# Patient Record
Sex: Male | Born: 1945 | Race: White | Hispanic: No | Smoking: Former smoker
Health system: Southern US, Community
[De-identification: ages and names within clinical notes are randomized; demographics above are authoritative.]

## PROBLEM LIST (undated history)

## (undated) DIAGNOSIS — R0789 Other chest pain: Secondary | ICD-10-CM

## (undated) DIAGNOSIS — R0602 Shortness of breath: Secondary | ICD-10-CM

## (undated) DIAGNOSIS — R972 Elevated prostate specific antigen [PSA]: Secondary | ICD-10-CM

## (undated) DIAGNOSIS — I10 Essential (primary) hypertension: Secondary | ICD-10-CM

## (undated) DIAGNOSIS — I214 Non-ST elevation (NSTEMI) myocardial infarction: Principal | ICD-10-CM

## (undated) DIAGNOSIS — M1711 Unilateral primary osteoarthritis, right knee: Secondary | ICD-10-CM

## (undated) DIAGNOSIS — D120 Benign neoplasm of cecum: Secondary | ICD-10-CM

## (undated) DIAGNOSIS — I2510 Atherosclerotic heart disease of native coronary artery without angina pectoris: Secondary | ICD-10-CM

## (undated) DIAGNOSIS — E119 Type 2 diabetes mellitus without complications: Secondary | ICD-10-CM

## (undated) HISTORY — PX: COLON SURGERY: SHX602

## (undated) HISTORY — PX: APPENDECTOMY: SHX54

## (undated) HISTORY — PX: CORONARY ARTERY BYPASS GRAFT: SHX141

## (undated) HISTORY — PX: NECK SURGERY: SHX720

## (undated) MED ORDER — DOXYCYCLINE HYCLATE 100 MG CAP
100 mg | ORAL_CAPSULE | Freq: Two times a day (BID) | ORAL | Status: AC
Start: ? — End: 2012-01-27

## (undated) MED ORDER — CARTIA XT 300 MG CAPSULE,EXTENDED RELEASE
300 mg | ORAL_CAPSULE | ORAL | Status: DC
Start: ? — End: 2013-06-28

## (undated) MED ORDER — ATORVASTATIN 10 MG TAB
10 mg | ORAL_TABLET | Freq: Every evening | ORAL | Status: DC
Start: ? — End: 2013-07-10

## (undated) MED ORDER — LISINOPRIL 20 MG TAB
20 mg | ORAL_TABLET | ORAL | Status: DC
Start: ? — End: 2013-06-28

---

## 2009-12-16 NOTE — Patient Instructions (Addendum)
Dear Jeffrey Soto,    I have reviewed your lab results.  EKG  Looks fine.   Mucinex OTC  1 pill twice daily  As needed  For cough. Wait till over this cold before you get your flu shot.   Based on this, I would like for you to schedule an appointment with me for blood pressure and cholesterol appointment.  Stop smoking pipe  Feel free to schedule an appointment by clicking on the "Request an appointment" link, or by calling my office at the number below.    Sincerely,    Calliope Delangel Oren Section, MD   Loletta Specter, [place], [facility]  [phone]

## 2009-12-17 LAB — CBC WITH AUTOMATED DIFF
ABS. BASOPHILS: 0.1 10*3/uL (ref 0.0–0.2)
ABS. EOSINOPHILS: 0.2 10*3/uL (ref 0.0–0.4)
ABS. IMM. GRANS.: 0 10*3/uL (ref 0.0–0.1)
ABS. LYMPHOCYTES: 1.6 10*3/uL (ref 0.7–4.5)
ABS. MONOCYTES: 0.6 10*3/uL (ref 0.1–1.0)
ABS. NEUTROPHILS: 3.4 10*3/uL (ref 1.8–7.8)
BASOPHILS: 1 % (ref 0–3)
EOSINOPHILS: 3 % (ref 0–7)
HCT: 42 % (ref 36.0–50.0)
HGB: 14.3 g/dL (ref 12.5–17.0)
IMMATURE GRANULOCYTES: 0 % (ref 0–2)
LYMPHOCYTES: 28 % (ref 14–46)
MCH: 28.9 pg (ref 27.0–34.0)
MCHC: 34 g/dL (ref 32.0–36.0)
MCV: 85 fL (ref 80–98)
MONOCYTES: 10 % (ref 4–13)
NEUTROPHILS: 58 % (ref 40–74)
PLATELET: 208 10*3/uL (ref 140–415)
RBC: 4.94 x10E6/uL (ref 4.10–5.60)
RDW: 14.2 % (ref 11.7–15.0)
WBC: 5.8 10*3/uL (ref 4.0–10.5)

## 2009-12-17 LAB — URINALYSIS W/ RFLX MICROSCOPIC
Bilirubin: NEGATIVE
Blood: NEGATIVE
Glucose: NEGATIVE
Ketone: NEGATIVE
Leukocyte Esterase: NEGATIVE
Nitrites: NEGATIVE
Protein: NEGATIVE
Specific Gravity: 1.015 (ref 1.005–1.030)
Urobilinogen: 0.2 mg/dL (ref 0.0–1.9)
pH (UA): 7 (ref 5.0–7.5)

## 2009-12-17 LAB — METABOLIC PANEL, COMPREHENSIVE
A-G Ratio: 1.8 (ref 1.1–2.5)
ALT (SGPT): 38 IU/L (ref 0–55)
AST (SGOT): 30 IU/L (ref 0–40)
Albumin: 4.6 g/dL (ref 3.6–4.8)
Alk. phosphatase: 59 IU/L (ref 25–160)
BUN/Creatinine ratio: 16 (ref 10–22)
BUN: 12 mg/dL (ref 8–27)
Bilirubin, total: 0.3 mg/dL (ref 0.0–1.2)
CO2: 23 mmol/L (ref 20–32)
Calcium: 8.9 mg/dL (ref 8.6–10.2)
Chloride: 100 mmol/L (ref 97–108)
Creatinine: 0.76 mg/dL (ref 0.76–1.27)
GFR est AA: 112 mL/min/{1.73_m2} (ref >59)
GFR est non-AA: 97 mL/min/{1.73_m2} (ref >59)
GLOBULIN, TOTAL: 2.6 g/dL (ref 1.5–4.5)
Glucose: 105 mg/dL — ABNORMAL HIGH (ref 65–99)
Potassium: 4.1 mmol/L (ref 3.5–5.2)
Protein, total: 7.2 g/dL (ref 6.0–8.5)
Sodium: 137 mmol/L (ref 135–145)

## 2009-12-17 LAB — LIPID PANEL
Cholesterol, total: 178 mg/dL (ref 100–199)
HDL Cholesterol: 36 mg/dL — ABNORMAL LOW (ref >39)
LDL, calculated: 108 mg/dL — ABNORMAL HIGH (ref 0–99)
Triglyceride: 170 mg/dL — ABNORMAL HIGH (ref 0–149)
VLDL, calculated: 34 mg/dL (ref 5–40)

## 2009-12-17 LAB — PSA, DIAGNOSTIC (PROSTATE SPECIFIC AG): Prostate Specific Ag: 1.2 ng/mL (ref 0.0–4.0)

## 2009-12-19 NOTE — Progress Notes (Signed)
Jeffrey Soto is a 64 y.o. male    HPI  Recovering from recent URI, feeling better today. Transient anal pain with coughing assoc'd . Pain responded to Preparation H  Past Medical History   Diagnosis Date   ??? HTN (hypertension) 12/16/2009   ??? Elevated lipids 12/16/2009   ??? HX OTHER MEDICAL      cubital tunnel,left elbow   ??? Depression        Past Surgical History   Procedure Date   ??? Hx urological      right orchiectomy for benign growth   ??? Endoscopy, colon, diagnostic    ??? Hx gi      right inguinal hernia repair x3   ??? Hx other surgical      removal pilonidal cyst       Current outpatient prescriptions   Medication Sig Dispense Refill   ??? lansoprazole (PREVACID) 15 mg capsule Take  by mouth Daily (before breakfast).       ??? diltiazem CD (CARDIZEM CD) 300 mg ER capsule Take 300 mg by mouth daily.       ??? citalopram (CELEXA) 20 mg tablet Take  by mouth daily.       ??? lisinopril (PRINIVIL, ZESTRIL) 20 mg tablet Take  by mouth daily.       ??? aspirin delayed-release 81 mg tablet Take  by mouth daily.       ??? rosuvastatin (CRESTOR) 5 mg tablet Take  by mouth nightly.           No Known Allergies  Family History   Problem Relation Age of Onset   ??? Cancer Sister      lung   ??? Cancer Sister      lung       History   Smoking status   ??? Current Some Day Smoker   ??? Types: Pipe   Smokeless tobacco   ??? Not on file         Review of Systems  Denies dyspnea.  Cardiac:  Denies exerional  chest pain  GI: Bowels move regularly without hematochezia.Marland Kitchen  Urinary stream weaker than last year.    Physical Examination:  BP 122/80   Temp 97.4 ??F (36.3 ??C)   Ht 6\' 2"  (1.88 m)   Wt 238 lb (107.956 kg)   BMI 30.56 kg/m2  Physical Exam  General:   Appears in no acute distress.   HEENT:   Negative                   Lungs:   Clear        Heart:  Regular without murmur, gallop or rub       Abdomen:   Benign exam without organomegaly or mass palpable    GU: lone testicle No mass felt.              Rectal:  smooth prostate, heme negative, no mass palpable   Extremities: No edema   Neurologic: Grossly nonfocal    Results for orders placed in visit on 12/16/09   METABOLIC PANEL, COMPREHENSIVE   Component Value Range   ??? Glucose 105 (*) 65 - 99 (mg/dL)   ??? BUN 12  8 - 27 (mg/dL)   ??? Creatinine 0.76  0.76 - 1.27 (mg/dL)   ??? GFR est non-AA 97  >59 (mL/min/1.73)   ??? GFR est AA 112  >59 (mL/min/1.73)   ??? BUN/Creatinine ratio 16  10 - 22    ???  Sodium 137  135 - 145 (mmol/L)   ??? Potassium 4.1  3.5 - 5.2 (mmol/L)   ??? Chloride 100  97 - 108 (mmol/L)   ??? CO2 23  20 - 32 (mmol/L)   ??? Calcium 8.9  8.6 - 10.2 (mg/dL)   ??? Protein, total 7.2  6.0 - 8.5 (g/dL)   ??? Albumin 4.6  3.6 - 4.8 (g/dL)   ??? GLOBULIN, TOTAL 2.6  1.5 - 4.5 (g/dL)   ??? A-G Ratio 1.8  1.1 - 2.5    ??? Bilirubin, total 0.3  0.0 - 1.2 (mg/dL)   ??? Alk. phosphatase 59  25 - 160 (IU/L)   ??? AST 30  0 - 40 (IU/L)   ??? ALT 38  0 - 55 (IU/L)   CBC WITH AUTOMATED DIFF   Component Value Range   ??? WBC 5.8  4.0 - 10.5 (x10E3/uL)   ??? RBC 4.94  4.10 - 5.60 (x10E6/uL)   ??? HGB 14.3  12.5 - 17.0 (g/dL)   ??? HCT 42.0  36.0 - 50.0 (%)   ??? MCV 85  80 - 98 (fL)   ??? MCH 28.9  27.0 - 34.0 (pg)   ??? MCHC 34.0  32.0 - 36.0 (g/dL)   ??? RDW 14.2  11.7 - 15.0 (%)   ??? PLATELET 208  140 - 415 (x10E3/uL)   ??? NEUTROPHILS 58  40 - 74 (%)   ??? LYMPHOCYTES 28  14 - 46 (%)   ??? MONOCYTES 10  4 - 13 (%)   ??? EOSINOPHILS 3  0 - 7 (%)   ??? BASOPHILS 1  0 - 3 (%)   ??? Immature cells CANCELED     ??? ABSOLUTE NEUTS 3.4  1.8 - 7.8 (x10E3/uL)   ??? ABSOLUTE LYMPHS 1.6  0.7 - 4.5 (x10E3/uL)   ??? ABSOLUTE MONOS 0.6  0.1 - 1.0 (x10E3/uL)   ??? ABSOLUTE EOSINS 0.2  0.0 - 0.4 (x10E3/uL)   ??? ABSOLUTE BASOS 0.1  0.0 - 0.2 (x10E3/uL)   ??? IMM. GRANS. 0  0 - 2 (%)   ??? ABS. IMM. GRANS. 0.0  0.0 - 0.1 (x10E3/uL)   ??? NRBC CANCELED     ??? Hematology Comments: CANCELED     LIPID PANEL   Component Value Range   ??? Cholesterol, total 178  100 - 199 (mg/dL)   ??? Triglyceride 170 (*) 0 - 149 (mg/dL)    ??? HDL Cholesterol 36 (*) >39 (mg/dL)   ??? VLDL, calculated 34  5 - 40 (mg/dL)   ??? LDL, calculated 108 (*) 0 - 99 (mg/dL)   PROSTATE SPECIFIC AG (PSA)   Component Value Range   ??? Prostate Specific Ag 1.2  0.0 - 4.0 (ng/mL)   URINALYSIS Averlee Swartz/ RFLX MICROSCOPIC   Component Value Range   ??? Specific Gravity 1.015  1.005 - 1.030    ??? pH 7.0  5.0 - 7.5    ??? Color Yellow  Yellow    ??? Appearance Clear  Clear    ??? Leukocyte Esterase Negative  Negative    ??? Protein Negative  Negative/Trace    ??? Glucose Negative  Negative    ??? GLUCOSE REFLEX CANCELED     ??? Ketone Negative  Negative    ??? Blood Negative  Negative    ??? Bilirubin Negative  Negative    ??? Urobilinogen 0.2  0.0 - 1.9 (mg/dL)   ??? Nitrites Negative  Negative    ??? Microscopic Examination Comment  EKG:  NSR. Nonspecific T changes.  Assessment/Plan  1. HTN (hypertension) (401.9AF)  METABOLIC PANEL, COMPREHENSIVE, AMB POC EKG ROUTINE Lamere Lightner/ 12 LEADS, INTER & REP, CBC WITH AUTOMATED DIFF, LIPID PANEL, PROSTATE SPECIFIC AG (PSA), URINALYSIS Anda Sobotta/ RFLX MICROSCOPIC   2. Elevated lipids (272.4EB)  METABOLIC PANEL, COMPREHENSIVE, AMB POC EKG ROUTINE Joe Gee/ 12 LEADS, INTER & REP, CBC WITH AUTOMATED DIFF, LIPID PANEL, PROSTATE SPECIFIC AG (PSA), URINALYSIS Sicilia Killough/ RFLX MICROSCOPIC   3. Special screening for malignant neoplasm of prostate (V76.44)  METABOLIC PANEL, COMPREHENSIVE, AMB POC EKG ROUTINE Viviana Trimble/ 12 LEADS, INTER & REP, CBC WITH AUTOMATED DIFF, LIPID PANEL, PROSTATE SPECIFIC AG (PSA), URINALYSIS Mordecai Tindol/ RFLX MICROSCOPIC   4. Encounter for long-term (current) use of other medications (V58.69)  METABOLIC PANEL, COMPREHENSIVE, AMB POC EKG ROUTINE Cranston Koors/ 12 LEADS, INTER & REP, CBC WITH AUTOMATED DIFF, LIPID PANEL, PROSTATE SPECIFIC AG (PSA), URINALYSIS Roxanna Mcever/ RFLX MICROSCOPIC   5. URI (upper respiratory infection) (465.9J)       bph sx, mild follow .   Labs noted  Local anal discomofort Allon Costlow/unrevealing  Exam. Call back prn.  leipid d/o and HTN under good control overall. Same rx.    RV 1 yr or prn.     Author:  Ysidro Evert, MD 9:01 PM10/20/2011

## 2010-01-15 NOTE — Progress Notes (Addendum)
Quick Note:    D/Jeffrey Soto pt. : result. Same rx. Lose wt.  ______

## 2010-06-26 NOTE — Progress Notes (Signed)
Jeffrey Soto is a 65 y.o. male     SUBJECTIVE:    Patient s/p lt thumb lac 2-3 wks ago ,[facility], with periodic tingling and lacinating pain   intermittient rt sciatica is better vs 1 mos ago.  Rhinitis responds to saline spray    Past Medical History   Diagnosis Date   ??? HTN (hypertension) 12/16/2009   ??? Elevated lipids 12/16/2009   ??? HX OTHER MEDICAL      cubital tunnel,left elbow   ??? Depression      Current Outpatient Prescriptions   Medication Sig Dispense Refill   ??? lansoprazole (PREVACID) 15 mg capsule Take  by mouth Daily (before breakfast).       ??? diltiazem CD (CARDIZEM CD) 300 mg ER capsule Take 300 mg by mouth daily.       ??? citalopram (CELEXA) 20 mg tablet Take  by mouth daily.       ??? lisinopril (PRINIVIL, ZESTRIL) 20 mg tablet Take  by mouth daily.       ??? aspirin delayed-release 81 mg tablet Take  by mouth daily.       ??? rosuvastatin (CRESTOR) 5 mg tablet Take  by mouth nightly.         No Known Allergies  .  History   Smoking status   ??? Current Some Day Smoker   ??? Types: Pipe   Smokeless tobacco   ??? Not on file       Review of Systems  Review of Systems - negative except as per HPI      Physical Examination:  BP 120/80   Wt 242 lb (109.77 kg)  Physical Exam    General: Appears in no acute distress  Lung-clear   Heart - reg  Abdomen -    Extremities - Lt thumb scab clean, from , neuro-vascular  intact  Rt leg - neg st. Leg raise sign    .   Results for orders placed in visit on 12/16/09   METABOLIC PANEL, COMPREHENSIVE       Component Value Range    Glucose 105 (*) 65 - 99 (mg/dL)    BUN 12  8 - 27 (mg/dL)    Creatinine 5.40  9.81 - 1.27 (mg/dL)    GFR est non-AA 97  >59 (mL/min/1.73)    GFR est AA 112  >59 (mL/min/1.73)    BUN/Creatinine ratio 16  10 - 22     Sodium 137  135 - 145 (mmol/L)    Potassium 4.1  3.5 - 5.2 (mmol/L)    Chloride 100  97 - 108 (mmol/L)    CO2 23  20 - 32 (mmol/L)    Calcium 8.9  8.6 - 10.2 (mg/dL)    Protein, total 7.2  6.0 - 8.5 (g/dL)     Albumin 4.6  3.6 - 4.8 (g/dL)    GLOBULIN, TOTAL 2.6  1.5 - 4.5 (g/dL)    A-G Ratio 1.8  1.1 - 2.5     Bilirubin, total 0.3  0.0 - 1.2 (mg/dL)    Alk. phosphatase 59  25 - 160 (IU/L)    AST 30  0 - 40 (IU/L)    ALT 38  0 - 55 (IU/L)   CBC WITH AUTOMATED DIFF       Component Value Range    WBC 5.8  4.0 - 10.5 (x10E3/uL)    RBC 4.94  4.10 - 5.60 (x10E6/uL)    HGB 14.3  12.5 -  17.0 (g/dL)    HCT 16.1  09.6 - 04.5 (%)    MCV 85  80 - 98 (fL)    MCH 28.9  27.0 - 34.0 (pg)    MCHC 34.0  32.0 - 36.0 (g/dL)    RDW 40.9  81.1 - 91.4 (%)    PLATELET 208  140 - 415 (x10E3/uL)    NEUTROPHILS 58  40 - 74 (%)    LYMPHOCYTES 28  14 - 46 (%)    MONOCYTES 10  4 - 13 (%)    EOSINOPHILS 3  0 - 7 (%)    BASOPHILS 1  0 - 3 (%)    IMMATURE CELLS CANCELED      ABS. NEUTROPHILS 3.4  1.8 - 7.8 (x10E3/uL)    ABS. LYMPHOCYTES 1.6  0.7 - 4.5 (x10E3/uL)    ABS. MONOCYTES 0.6  0.1 - 1.0 (x10E3/uL)    ABS. EOSINOPHILS 0.2  0.0 - 0.4 (x10E3/uL)    ABS. BASOPHILS 0.1  0.0 - 0.2 (x10E3/uL)    IMMATURE GRANULOCYTES 0  0 - 2 (%)    ABS. IMM. GRANS. 0.0  0.0 - 0.1 (x10E3/uL)    NRBC CANCELED      Hematology Comments: CANCELED     LIPID PANEL       Component Value Range    Cholesterol, total 178  100 - 199 (mg/dL)    Triglyceride 782 (*) 0 - 149 (mg/dL)    HDL Cholesterol 36 (*) >39 (mg/dL)    VLDL, calculated 34  5 - 40 (mg/dL)    LDL, calculated 956 (*) 0 - 99 (mg/dL)   PROSTATE SPECIFIC AG (PSA)       Component Value Range    Prostate Specific Ag 1.2  0.0 - 4.0 (ng/mL)   URINALYSIS Alesandro Stueve/ RFLX MICROSCOPIC       Component Value Range    Specific Gravity 1.015  1.005 - 1.030     pH 7.0  5.0 - 7.5     Color Yellow  Yellow     Appearance Clear  Clear     Leukocyte Esterase Negative  Negative     Protein Negative  Negative/Trace     Glucose Negative  Negative     GLUCOSE REFLEX CANCELED      Ketone Negative  Negative     Blood Negative  Negative     Bilirubin Negative  Negative     Urobilinogen 0.2  0.0 - 1.9 (mg/dL)    Nitrites Negative  Negative      Microscopic Examination Comment          Assessment/Plan  1. HTN (hypertension)  SPECIMEN HANDLING,DR OFF->LAB, LIPID PANEL, AST   2. Elevated lipids  SPECIMEN HANDLING,DR OFF->LAB, LIPID PANEL, AST   3. Encounter for long-term (current) use of other medications  SPECIMEN HANDLING,DR OFF->LAB, LIPID PANEL, AST     Recovering from rt thumb laceration. Tetanus booster given at [facility].  Follow   Other health problems appear stable. Same rx  Advised smoking cessation of occasional cigar and pipe.   rv 59m for cpe or prn      Author:  Ysidro Evert, MD 10:41 AM4/26/2012

## 2010-06-26 NOTE — Patient Instructions (Signed)
MyChart Activation    Thank you for requesting access to MyChart. Please follow the instructions below to securely access and download your online medical record. MyChart allows you to send messages to your doctor, view your test results, renew your prescriptions, schedule appointments, and more.    How Do I Sign Up?    1. In your internet browser, go to [email or web address]  2. Click on the First Time User? Click Here link in the Sign In box. You will be redirect to the New Member Sign Up page.  3. Enter your MyChart Access Code exactly as it appears below. You will not need to use this code after you???ve completed the sign-up process. If you do not sign up before the expiration date, you must request a new code.    MyChart Access Code: [email or web address]  Expires: 09/24/2010 11:13 AM (This is the date your MyChart access code will expire)    4. Enter the last four digits of your Social Security Number (xxxx) and Date of Birth (mm/dd/yyyy) as indicated and click Submit. You will be taken to the next sign-up page.  5. Create a MyChart ID. This will be your MyChart login ID and cannot be changed, so think of one that is secure and easy to remember.  6. Create a MyChart password. You can change your password at any time.  7. Enter your Password Reset Question and Answer. This can be used at a later time if you forget your password.   8. Enter your e-mail address. You will receive e-mail notification when new information is available in MyChart.  9. Click Sign Up. You can now view and download portions of your medical record.  10. Click the Download Summary menu link to download a portable copy of your medical information.    Additional Information    If you have questions, please call [phone]. Remember, MyChart is NOT to be used for urgent needs. For medical emergencies, dial 911.      No smoking . Lose weight. Physical in 6 months.

## 2010-06-27 LAB — LIPID PANEL
Cholesterol, total: 186 mg/dL (ref 100–199)
HDL Cholesterol: 36 mg/dL — ABNORMAL LOW (ref >39)
LDL, calculated: 89 mg/dL (ref 0–99)
Triglyceride: 304 mg/dL — ABNORMAL HIGH (ref 0–149)
VLDL, calculated: 61 mg/dL — ABNORMAL HIGH (ref 5–40)

## 2010-06-27 LAB — AST: AST (SGOT): 30 IU/L (ref 0–40)

## 2010-06-30 NOTE — Progress Notes (Signed)
Quick Note:    LMOM. Same meds. Lose wt through diet and exercise  ______

## 2011-01-14 NOTE — Progress Notes (Signed)
Jeffrey Soto is a 65 y.o. male    HPI  Routine physical   Past Medical History   Diagnosis Date   ??? HTN (hypertension) 12/16/2009   ??? Elevated lipids 12/16/2009   ??? HX OTHER MEDICAL      cubital tunnel,left elbow   ??? Depression    ??? Fatty liver    ??? Gallbladder polyp    ??? GERD (gastroesophageal reflux disease) 06/26/03     EGD: duodenitis , dilation of Esophageal ring , DR. Ardine Eng     Past Surgical History   Procedure Date   ??? Hx urological      right orchiectomy for benign growth   ??? Hx gi      right inguinal hernia repair x3   ??? Hx other surgical      removal pilonidal cyst   ??? Endoscopy, colon, diagnostic 06/26/03     diverticulosis, polypectomy, f/u dr. Ardine Eng      Current Outpatient Prescriptions   Medication Sig Dispense Refill   ??? lansoprazole (PREVACID) 15 mg capsule Take  by mouth Daily (before breakfast).       ??? diltiazem CD (CARDIZEM CD) 300 mg ER capsule Take 300 mg by mouth daily.       ??? citalopram (CELEXA) 20 mg tablet Take  by mouth daily.       ??? lisinopril (PRINIVIL, ZESTRIL) 20 mg tablet Take  by mouth daily.       ??? aspirin delayed-release 81 mg tablet Take  by mouth daily.       ??? rosuvastatin (CRESTOR) 5 mg tablet Take  by mouth nightly.         No Known Allergies  Family History   Problem Relation Age of Onset   ??? Cancer Sister      lung   ??? Cancer Sister      lung   ??? Lung Disease Father      emphysema   ??? Heart Disease Brother 52     sudden death  in sleep     History   Smoking status   ??? Current Some Day Smoker   ??? Types: Pipe   Smokeless tobacco   ??? Not on file       Review of Systems  Denies dyspnea.  Cardiac:  Denies chest pain  GI: Bowels move regularly without hematochezia.Marland Kitchen  Urination without difficulty    2-3 mos  Ago , had 6-8 wks of intermittent left arm tingling . May have occurred with lifting sometimes. But Jeffrey Soto/o assoc'd cp, sob, diaphoresis, dizziness, nausea or neck pain  Physical Examination:  BP 140/88  Physical Exam  General:   Appears in no acute distress.    HEENT:   Negative            Skin: few skin tags on lateral thorax   GU: nl genitalia Jeffrey Soto/o hernia       Lungs:   Clear        Heart:  Regular without murmur, gallop or rub       Abdomen:   Benign exam without organomegaly or mass palpable                Rectal:  smooth prostate, heme negative, no mass palpable   Extremities: No edema   Neurologic: Grossly nonfocal        Assessment/Plan  1. Routine general medical examination at a health care facility  PR SPECIMEN HANDLING,DR OFF->LAB, LIPID PANEL, CBC WITH AUTOMATED DIFF, PROSTATE  SPECIFIC AG (PSA), METABOLIC PANEL, COMPREHENSIVE, URINALYSIS Jeffrey Soto/ RFLX MICROSCOPIC, INFLUENZA VIRUS VACCINE, SPLIT, IN INDIVIDS. >=3 YRS OF AGE, IM   2. Need for prophylactic vaccination and inoculation against influenza  INFLUENZA VIRUS VACCINE, SPLIT, IN INDIVIDS. >=3 YRS OF AGE, IM       Flu Vax given   Left arm numbness possibly cervical radiculopathy  Hx Gonadisim .  HTN good control. Same rx.   Await lipids.   rv 6 m or prn sooner  Author:  Ysidro Evert, MD 9:10 AM11/14/2012

## 2011-01-14 NOTE — Patient Instructions (Addendum)
MyChart Activation    Thank you for requesting access to MyChart. Please follow the instructions below to securely access and download your online medical record. MyChart allows you to send messages to your doctor, view your test results, renew your prescriptions, schedule appointments, and more.    How Do I Sign Up?    1. In your internet browser, go to [email or web address]  2. Click on the First Time User? Click Here link in the Sign In box. You will be redirect to the New Member Sign Up page.  3. Enter your MyChart Access Code exactly as it appears below. You will not need to use this code after you???ve completed the sign-up process. If you do not sign up before the expiration date, you must request a new code.    MyChart Access Code: [email or web address]  Expires: 04/14/2011  9:13 AM (This is the date your MyChart access code will expire)    4. Enter the last four digits of your Social Security Number (xxxx) and Date of Birth (mm/dd/yyyy) as indicated and click Submit. You will be taken to the next sign-up page.  5. Create a MyChart ID. This will be your MyChart login ID and cannot be changed, so think of one that is secure and easy to remember.  6. Create a MyChart password. You can change your password at any time.  7. Enter your Password Reset Question and Answer. This can be used at a later time if you forget your password.   8. Enter your e-mail address. You will receive e-mail notification when new information is available in MyChart.  9. Click Sign Up. You can now view and download portions of your medical record.  10. Click the Download Summary menu link to download a portable copy of your medical information.    Additional Information    If you have questions, please visit the Frequently Asked Questions section of the MyChart website at [email or web address] Remember, MyChart is NOT to be used for urgent needs. For medical emergencies, dial 911.       Same meds . Return soon- EKG. Early morning testosternone level.   Lose weight.

## 2011-01-15 LAB — CBC WITH AUTOMATED DIFF
ABS. BASOPHILS: 0 10*3/uL (ref 0.0–0.2)
ABS. EOSINOPHILS: 0.1 10*3/uL (ref 0.0–0.4)
ABS. IMM. GRANS.: 0 10*3/uL (ref 0.0–0.1)
ABS. MONOCYTES: 0.7 10*3/uL (ref 0.1–1.0)
ABS. NEUTROPHILS: 3.9 10*3/uL (ref 1.8–7.8)
Abs Lymphocytes: 1.2 10*3/uL (ref 0.7–4.5)
BASOPHILS: 1 % (ref 0–3)
EOSINOPHILS: 2 % (ref 0–7)
HCT: 43.6 % (ref 37.5–51.0)
HGB: 14.8 g/dL (ref 12.6–17.7)
IMMATURE GRANULOCYTES: 0 % (ref 0–2)
Lymphocytes: 20 % (ref 14–46)
MCH: 30 pg (ref 26.6–33.0)
MCHC: 33.9 g/dL (ref 31.5–35.7)
MCV: 88 fL (ref 79–97)
MONOCYTES: 11 % (ref 4–13)
NEUTROPHILS: 66 % (ref 40–74)
PLATELET: 204 10*3/uL (ref 140–415)
RBC: 4.94 x10E6/uL (ref 4.14–5.80)
RDW: 14.2 % (ref 12.3–15.4)
WBC: 6 10*3/uL (ref 4.0–10.5)

## 2011-01-15 LAB — PSA, DIAGNOSTIC (PROSTATE SPECIFIC AG): Prostate Specific Ag: 1.4 ng/mL (ref 0.0–4.0)

## 2011-01-15 LAB — URINALYSIS W/ RFLX MICROSCOPIC
Bilirubin: NEGATIVE
Blood: NEGATIVE
Glucose: NEGATIVE
Ketone: NEGATIVE
Leukocyte Esterase: NEGATIVE
Nitrites: NEGATIVE
Protein: NEGATIVE
Specific Gravity: 1.02 (ref 1.005–1.030)
Urobilinogen: 0.2 mg/dL (ref 0.0–1.9)
pH (UA): 6.5 (ref 5.0–7.5)

## 2011-01-15 LAB — METABOLIC PANEL, COMPREHENSIVE
A-G Ratio: 1.9 (ref 1.1–2.5)
ALT (SGPT): 52 IU/L — ABNORMAL HIGH (ref 0–44)
AST (SGOT): 45 IU/L — ABNORMAL HIGH (ref 0–40)
Albumin: 4.9 g/dL — ABNORMAL HIGH (ref 3.6–4.8)
Alk. phosphatase: 59 IU/L (ref 25–160)
BUN/Creatinine ratio: 18 (ref 10–22)
BUN: 17 mg/dL (ref 8–27)
Bilirubin, total: 0.4 mg/dL (ref 0.0–1.2)
CO2: 18 mmol/L — ABNORMAL LOW (ref 20–32)
Calcium: 9.2 mg/dL (ref 8.6–10.2)
Chloride: 105 mmol/L (ref 97–108)
Creatinine: 0.97 mg/dL (ref 0.76–1.27)
GFR est non-AA: 82 mL/min/{1.73_m2} (ref >59)
GLOBULIN, TOTAL: 2.6 g/dL (ref 1.5–4.5)
Glucose: 92 mg/dL (ref 65–99)
Potassium: 3.9 mmol/L (ref 3.5–5.2)
Protein, total: 7.5 g/dL (ref 6.0–8.5)
Sodium: 141 mmol/L (ref 134–144)
eGFR If African American: 95 mL/min/{1.73_m2} (ref >59)

## 2011-01-15 LAB — LIPID PANEL
Cholesterol, total: 186 mg/dL (ref 100–199)
HDL Cholesterol: 35 mg/dL — ABNORMAL LOW (ref >39)
LDL, calculated: 108 mg/dL — ABNORMAL HIGH (ref 0–99)
Triglyceride: 215 mg/dL — ABNORMAL HIGH (ref 0–149)
VLDL, calculated: 43 mg/dL — ABNORMAL HIGH (ref 5–40)

## 2011-01-15 NOTE — Progress Notes (Signed)
Quick Note:    D/Jeffrey Soto pt . Lose weight. Same meds lose weight.     ______

## 2011-05-26 MED ORDER — PRINIVIL 20 MG TABLET
20 mg | ORAL_TABLET | ORAL | Status: DC
Start: 2011-05-26 — End: 2012-05-28

## 2011-05-26 MED ORDER — CARDIZEM CD 300 MG CAPSULE,EXTENDED RELEASE
300 mg | ORAL_CAPSULE | ORAL | Status: DC
Start: 2011-05-26 — End: 2012-05-28

## 2011-07-29 MED ORDER — CRESTOR 5 MG TABLET
5 mg | ORAL_TABLET | ORAL | Status: DC
Start: 2011-07-29 — End: 2012-06-06

## 2012-01-15 LAB — AMB POC COMPLETE CBC,AUTOMATED ENTER
ABS. LYMPHS (POC): 2 10*3/uL (ref 1.2–3.4)
HCT (POC): 38.9 % (ref 35.0–60.0)
HGB (POC): 13 g/dL (ref 12.0–16.0)
LYMPHOCYTES (POC): 24.2 % (ref 20.5–51.1)
MCHC (POC): 33.5 g/dL (ref 33.0–37.0)
MCV (POC): 89.5 fL (ref 80.0–99.9)
PLATELET (POC): 193 10*3/uL (ref 150–450)
RBC (POC): 4.34 M/uL (ref 4.00–6.00)
WBC (POC): 8.1 10*3/uL (ref 4.5–10.5)

## 2012-01-15 LAB — AMB POC URINALYSIS DIP STICK AUTO W/O MICRO
Bilirubin (UA POC): NEGATIVE
Blood (UA POC): NEGATIVE
Glucose (UA POC): NEGATIVE
Ketones (UA POC): NEGATIVE
Leukocyte esterase (UA POC): NEGATIVE
Nitrites (UA POC): NEGATIVE
Protein (UA POC): NEGATIVE mg/dL
Specific gravity (UA POC): 1.02 (ref 1.001–1.035)
Urobilinogen (UA POC): 0.2 (ref 0.2–1)
pH (UA POC): 7.5 (ref 4.6–8.0)

## 2012-01-15 NOTE — Patient Instructions (Addendum)
Wait till you are over the cold , then get a flu shot  Wait 2 weeks and get Shingles vaccine   Same meds. Return in 1 year or sooner as needed

## 2012-01-15 NOTE — Progress Notes (Signed)
Jeffrey Soto is a 66 y.o. male    HPI   Routine physical   Past Medical History   Diagnosis Date   ??? HTN (hypertension) 12/16/2009   ??? Elevated lipids 12/16/2009   ??? HX OTHER MEDICAL      cubital tunnel,left elbow   ??? Depression    ??? Fatty liver    ??? Gallbladder polyp    ??? GERD (gastroesophageal reflux disease) 06/26/03     EGD: duodenitis , dilation of Esophageal ring , DR. [name]   ??? Arrhythmia      Hx PAT. dr. [name] evaluated.      Past Surgical History   Procedure Laterality Date   ??? Hx urological       right orchiectomy for benign growth   ??? Hx gi       right inguinal hernia repair x3   ??? Hx other surgical       removal pilonidal cyst   ??? Endoscopy, colon, diagnostic  06/26/03     diverticulosis, polypectomy, f/u dr. Ardine Eng      Current Outpatient Prescriptions   Medication Sig Dispense Refill   ??? doxycycline (VIBRAMYCIN) 100 mg capsule Take 1 Cap by mouth two (2) times a day for 10 days.  20 Cap  0   ??? omeprazole (PRILOSEC OTC) 20 mg tablet Take 20 mg by mouth daily.       ??? CRESTOR 5 mg tablet TAKE ONE TABLET DAILY IN PLACE OF SIMVASTAIN  30 Tab  12   ??? PRINIVIL 20 mg tablet TAKE ONE TABLET EVERY MORNING  30 Tab  12   ??? CARDIZEM CD 300 mg ER capsule TAKE ONE CAPSULE DAILY  30 Cap  12   ??? aspirin delayed-release 81 mg tablet Take  by mouth daily.         No current facility-administered medications for this visit.     No Known Allergies  Family History   Problem Relation Age of Onset   ??? Cancer Sister      lung   ??? Cancer Sister      lung   ??? Lung Disease Father      emphysema   ??? Heart Disease Brother 52     sudden death  in sleep     History   Smoking status   ??? Current Some Day Smoker   ??? Types: Pipe   Smokeless tobacco   ??? Not on file       Review of Systems  Denies dyspnea.  Cardiac:  Denies chest pain  GI: Bowels move regularly without hematochezia.Marland Kitchen  Urinary stream a little slower  Vs 1 yr ago .   Recovering 2 week URI . Cough dry   Aleve taken , helps chronic rt hip pain . Left thumb base mildly  tender  at times.   Vol wt loss primarily via diet.     Physical Examination:  BP 130/72   Ht 6\' 2"  (1.88 m)   Wt 211 lb (95.709 kg)   BMI 27.08 kg/m2  Physical Exam  General:   Appears in no acute distress.   HEENT:   Negative                   Lungs:   Clear        Heart:  Regular without murmur, gallop or rub       Abdomen:   Benign exam without organomegaly or mass palpable. GU: lone testicle [name]/o mass. O/Jeffrey Soto  nl genitalia                 Rectal:  smooth mildly enlarged  prostate, heme negative, no mass palpable   Extremities: No edema; hips Mildly decreased hip ROM . Base lt thumb nt ; thumb FROM   Neurologic: Grossly nonfocal    EKG: Sinus Bradycardia . No acute changes seen .   PSA 3.9 vs baseline PSA's in the low 1.0's   Non HDL 123   U/a neg  CBC nl      Assessment/Plan  1. Routine general medical examination at a health care facility  omeprazole (PRILOSEC OTC) 20 mg tablet, LIPID PANEL, AMB POC EKG ROUTINE [place]onia Zou/ 12 LEADS, INTER & REP, PROSTATE SPECIFIC AG (PSA), AMB POC COMPLETE CBC,AUTOMATED ENTER, AMB POC URINALYSIS DIP STICK AUTO Taye Cato/O MICRO, METABOLIC PANEL, COMPREHENSIVE, PR COLLECTION VENOUS BLOOD,VENIPUNCTURE     Possible Subclinical Prostatitis. ERX Doxycycline sent .   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 P: rest, heat , careful Aleve prn   Lipids better Kreig Parson/ wt loss. P: continue Crestor. And weight loss efforts.   HTN  Well controlled. Same rx.   Voices no c/o about palpitations.   reheck PSA in 1 mos   Next cpe in 1 yr or  Prn .    Author:  Ysidro Evert, MD [id number] PM11/17/2013

## 2012-01-16 LAB — METABOLIC PANEL, COMPREHENSIVE
A-G Ratio: 1.7 (ref 1.1–2.5)
ALT (SGPT): 29 IU/L (ref 0–44)
AST (SGOT): 23 IU/L (ref 0–40)
Albumin: 4.5 g/dL (ref 3.6–4.8)
Alk. phosphatase: 56 IU/L (ref 39–117)
BUN/Creatinine ratio: 23 — ABNORMAL HIGH (ref 10–22)
BUN: 20 mg/dL (ref 8–27)
Bilirubin, total: 0.5 mg/dL (ref 0.0–1.2)
CO2: 24 mmol/L (ref 19–28)
Calcium: 9.3 mg/dL (ref 8.6–10.2)
Chloride: 103 mmol/L (ref 97–108)
Creatinine: 0.88 mg/dL (ref 0.76–1.27)
GFR est AA: 104 mL/min/{1.73_m2} (ref >59)
GFR est non-AA: 90 mL/min/{1.73_m2} (ref >59)
GLOBULIN, TOTAL: 2.6 g/dL (ref 1.5–4.5)
Glucose: 81 mg/dL (ref 65–99)
Potassium: 4.3 mmol/L (ref 3.5–5.2)
Protein, total: 7.1 g/dL (ref 6.0–8.5)
Sodium: 140 mmol/L (ref 134–144)

## 2012-01-16 LAB — LIPID PANEL
Cholesterol, total: 158 mg/dL (ref 100–199)
HDL Cholesterol: 35 mg/dL — ABNORMAL LOW (ref >39)
LDL, calculated: 84 mg/dL (ref 0–99)
Triglyceride: 195 mg/dL — ABNORMAL HIGH (ref 0–149)
VLDL, calculated: 39 mg/dL (ref 5–40)

## 2012-01-16 LAB — PSA, DIAGNOSTIC (PROSTATE SPECIFIC AG): Prostate Specific Ag: 3.9 ng/mL (ref 0.0–4.0)

## 2012-01-17 NOTE — Progress Notes (Signed)
Quick Note:    PC. I gave results and sent ERX Vibramycin for higher PSA reading /possible subclinical prostatitis. Jeffrey Soto- He will call for PSA(v58.69) lab only , visit in 1 month.    Also pt. To continue Crestor . Lipids improved.  ______

## 2012-02-15 NOTE — Progress Notes (Signed)
psa

## 2012-02-16 ENCOUNTER — Telehealth

## 2012-02-16 LAB — PSA, DIAGNOSTIC (PROSTATE SPECIFIC AG): Prostate Specific Ag: 2.7 ng/mL (ref 0.0–4.0)

## 2012-02-16 NOTE — Telephone Encounter (Signed)
PC. I gave results . Referred to GU for PSA above baseline

## 2012-04-05 NOTE — Progress Notes (Signed)
Quick Note:    Pt scheduled appt  ______

## 2012-06-06 NOTE — Telephone Encounter (Signed)
Patient wanted to chg cholesterol med due to insurance reasons

## 2012-06-06 NOTE — Telephone Encounter (Signed)
Message copied by Star Age on Mon Jun 06, 2012  9:59 AM  ------       Message from: Adrian, Aggie Cosier R       Created: Mon Jun 06, 2012  9:08 AM       Contact: [phone]         [phone]       Wants to know what the nurse thinks about switching pharmacies ?????  ------

## 2012-07-18 LAB — AMB POC LIPID PROFILE
Cholesterol (POC): 156 mg/dL (ref 100–199)
HDL Cholesterol (POC): 33 mg/dL — AB (ref 35–150)
LDL Cholesterol (POC): 88 mg/dL (ref 0–129)
Non-HDL Goal (POC): 123
TChol/HDL Ratio (POC): 4.7 CALC (ref 0.0–5.0)
Triglycerides (POC): 177 mg/dL — AB (ref 0–150)

## 2012-07-18 NOTE — Progress Notes (Signed)
Lipids and sgot

## 2012-07-19 LAB — AST: AST (SGOT): 23 IU/L (ref 0–40)

## 2012-07-20 NOTE — Progress Notes (Signed)
Quick Note:    L/M results. Continue same rx . Keep wt down via low chol diet and exercise.  ______

## 2012-09-27 LAB — AMB POC COMPLETE CBC,AUTOMATED ENTER
ABS. GRANS (POC): 3.8 10*3/uL (ref 1.4–6.5)
ABS. LYMPHS (POC): 1.6 10*3/uL (ref 1.2–3.4)
ABS. MONOS (POC): 0.5 10*3/uL (ref 0.1–0.6)
GRANULOCYTES (POC): 63.8 % (ref 42.2–75.2)
HCT (POC): 39.6 % (ref 35.0–60.0)
HGB (POC): 13.7 g/dL (ref 11–18)
LYMPHOCYTES (POC): 26.9 % (ref 20.5–51.1)
MCH (POC): 30.5 pg (ref 27.0–31.0)
MCHC (POC): 34.5 g/dL (ref 33.0–37.0)
MCV (POC): 88.6 fL (ref 80.0–99.9)
MONOCYTES (POC): 9.3 % (ref 1.7–9.3)
MPV (POC): 8 fL (ref 7.8–11)
PLATELET (POC): 164 10*3/uL (ref 150–450)
RBC (POC): 4.47 M/uL (ref 4.00–6.00)
RDW (POC): 13.1 % (ref 11.6–13.7)
WBC (POC): 5.9 10*3/uL (ref 4.5–10.5)

## 2012-09-27 LAB — AMB POC LIPID PROFILE
Cholesterol (POC): 175 mg/dL (ref 100–199)
HDL Cholesterol (POC): 30 mg/dL — AB (ref 35–150)
LDL Cholesterol (POC): 90 mg/dL (ref 0–129)
Non-HDL Goal (POC): 145
TChol/HDL Ratio (POC): 5.9 CALC — AB (ref 0.0–5.0)
Triglycerides (POC): 276 mg/dL — AB (ref 0–150)

## 2012-09-27 NOTE — Progress Notes (Signed)
Jeffrey Soto is a 67 y.o. male quit smoking his pipe 3 wks ago     SUBJECTIVE:    1 m rt infrascapular pain(at times worse with  deep breath) , pain better recetnly .  2-3 mos of  cough [place] T[place]t/ sputum , occasional chest pain   Nasal congestion . Occasional Saline spray or [employer] more golf in past 4 mos.   Denies fever, night sweats, exotic travel.   Rt calf rash -asx ; derm eval'n done.  Rt arm strain 2 mos ago , better   Past Medical History   Diagnosis Date   ??? HTN (hypertension) 12/16/2009   ??? Elevated lipids 12/16/2009   ??? HX OTHER MEDICAL      cubital tunnel,left elbow   ??? Depression    ??? Fatty liver    ??? Gallbladder polyp    ??? GERD (gastroesophageal reflux disease) 06/26/03     EGD: duodenitis , dilation of Esophageal ring , DR. [name]   ??? Arrhythmia      Hx PAT. dr. [name] evaluated.      Current Outpatient Prescriptions   Medication Sig Dispense Refill   ??? lansoprazole (PREVACID) 15 mg capsule Take  by mouth Daily (before breakfast).       ??? atorvastatin (LIPITOR) 10 mg tablet Take 1 Tab by mouth nightly.  30 Tab  12   ??? CARTIA XT 300 mg ER capsule TAKE ONE CAPSULE DAILY  30 Cap  12   ??? lisinopril (PRINIVIL, ZESTRIL) 20 mg tablet TAKE ONE TABLET EVERY MORNING  30 Tab  12   ??? aspirin delayed-release 81 mg tablet Take  by mouth daily.         Not on File  .  History   Smoking status   ??? Current Some Day Smoker   ??? Types: Pipe   Smokeless tobacco   ??? Not on file       Review of Systems  Review of Systems - negative except as per HPI      Physical Examination:  BP 140/90   Temp(Src) 97.2 ??F (36.2 ??C)   Ht 6\' 2"  (1.88 m)   Wt 222 lb 8 oz (100.925 kg)   BMI 28.56 kg/m2  Physical Exam    General: Appears in no acute distress  Neck ext does not reproduce rt periscapular pain   Throat clear  Lung-clear  Heart - reg   Abdomen -bs+,soft nt  Extremities - Rt UE  from , nt   Strength intact     .   Results for orders placed in visit on 09/27/12   AMB POC LIPID PROFILE       Result Value Range    Cholesterol (POC)  175  100 - 199 mg/dL    Triglycerides (POC) 276 (*) 0 - 150 mg/dL    HDL Cholesterol (POC) 30 (*) 35 - 150 mg/dL    LDL Cholesterol (POC) 90  0 - 129 mg/dL    Non-HDL Goal (POC) 161      TChol/HDL Ratio (POC) 5.9 (*) 0.0 - 5.0 CALC   AMB POC COMPLETE CBC,AUTOMATED ENTER       Result Value Range    WBC (POC) 5.9  4.5 - 10.5 K/uL    LYMPHOCYTES (POC) 26.9  20.5 - 51.1 %    MONOCYTES (POC) 9.3  1.7 - 9.3 %    GRANULOCYTES (POC) 63.8  42.2 - 75.2 %    ABS. LYMPHS (POC) 1.6  1.2 - 3.4 K/uL    ABS. MONOS (POC) 0.5  0.1 - 0.6 10^3/ul    ABS. GRANS (POC) 3.8  1.4 - 6.5 10^3/ul    RBC (POC) 4.47  4.00 - 6.00 M/uL    HGB (POC) 13.7  11 - 18 g/dL    HCT (POC) 21.3  08.6 - 60.0 %    MCV (POC) 88.6  80.0 - 99.9 fL    MCH (POC) 30.5  27.0 - 31.0 pg    MCHC (POC) 34.5  33.0 - 37.0 g/dL    RDW (POC) 57.8  46.9 - 13.7 %    PLATELET (POC) 164  150 - 450 K/uL    MPV (POC) 8.0  7.8 - 11 fL        Assessment/Plan    ICD-9-CM    1. Cough 786.2 lansoprazole (PREVACID) 15 mg capsule     XR CHEST PA LAT     AMB POC LIPID PROFILE     AMB POC COMPLETE CBC,AUTOMATED ENTER     PR COLLECTION VENOUS BLOOD,VENIPUNCTURE     AST   2. Arm pain 729.5 lansoprazole (PREVACID) 15 mg capsule     XR CHEST PA LAT     AMB POC LIPID PROFILE     AMB POC COMPLETE CBC,AUTOMATED ENTER     PR COLLECTION VENOUS BLOOD,VENIPUNCTURE     AST   3. HTN (hypertension) 401.9 lansoprazole (PREVACID) 15 mg capsule     XR CHEST PA LAT     AMB POC LIPID PROFILE     AMB POC COMPLETE CBC,AUTOMATED ENTER     PR COLLECTION VENOUS BLOOD,VENIPUNCTURE     AST   4. Elevated lipids 272.4 lansoprazole (PREVACID) 15 mg capsule     XR CHEST PA LAT     AMB POC LIPID PROFILE     AMB POC COMPLETE CBC,AUTOMATED ENTER     PR COLLECTION VENOUS BLOOD,VENIPUNCTURE     AST   5. Encounter for long-term (current) use of other medications V58.69 lansoprazole (PREVACID) 15 mg capsule     XR CHEST PA LAT     AMB POC LIPID PROFILE     AMB POC COMPLETE CBC,AUTOMATED ENTER     PR COLLECTION VENOUS  BLOOD,VENIPUNCTURE     AST    cough , periscapular pain . P: CXR ordered. Use Saline nasal spray regularly . Call back Aleysha Meckler/ progress   HTN -had no bp meds today -continue current rx.   Lipids -continue current tx.   Follow up in 4 m  or PRN.    Author:  Ysidro Evert, MD 9:02 AM7/29/2014

## 2012-09-27 NOTE — Patient Instructions (Signed)
Saline nasal spray . 2 sprays each nostril morning and night  No golf for a few days  Get Xray of chest.   Return in November

## 2012-09-28 LAB — AST: AST (SGOT): 22 IU/L (ref 0–40)

## 2012-09-28 NOTE — Addendum Note (Signed)
Addended by: Coralyn Pear IV on: 09/28/2012 01:27 PM     Modules accepted: Orders

## 2012-09-28 NOTE — Progress Notes (Signed)
Quick Note:    Pc. Jeffrey Soto pt. Call me back in a few weeks Natacia Chaisson/ progress  ______

## 2012-09-29 LAB — SPECIMEN STATUS REPORT

## 2012-09-29 LAB — CK: Creatine Kinase,Total: 196 U/L (ref 24–204)

## 2013-05-03 LAB — AMB POC LIPID PROFILE
Cholesterol (POC): 158 mg/dL (ref 100–199)
HDL Cholesterol (POC): 33 mg/dL — AB (ref 35–150)
LDL Cholesterol (POC): 96 mg/dL (ref 0–129)
Non-HDL Goal (POC): 126
TChol/HDL Ratio (POC): 4.8 CALC (ref 0.0–5.0)
Triglycerides (POC): 148 mg/dL (ref 0–150)

## 2013-05-03 LAB — AMB POC COMPLETE CBC,AUTOMATED ENTER
ABS. GRANS (POC): 5.8 10*3/uL (ref 1.4–6.5)
ABS. LYMPHS (POC): 1.8 10*3/uL (ref 1.2–3.4)
ABS. MONOS (POC): 0.8 10*3/uL — AB (ref 0.1–0.6)
GRANULOCYTES (POC): 69 % (ref 42.2–75.2)
HCT (POC): 43.9 % (ref 35.0–60.0)
HGB (POC): 14.8 g/dL (ref 11–18)
LYMPHOCYTES (POC): 21.6 % (ref 20.5–51.1)
MCH (POC): 30 pg (ref 27.0–31.0)
MCHC (POC): 33.8 g/dL (ref 33.0–37.0)
MCV (POC): 88.8 fL (ref 80.0–99.9)
MONOCYTES (POC): 9.4 % — AB (ref 1.7–9.3)
MPV (POC): 7.9 fL (ref 7.8–11)
PLATELET (POC): 221 10*3/uL (ref 150–450)
RBC (POC): 4.94 M/uL (ref 4.00–6.00)
RDW (POC): 13.3 % (ref 11.6–13.7)
WBC (POC): 8.4 10*3/uL (ref 4.5–10.5)

## 2013-05-03 NOTE — Patient Instructions (Signed)
Same medications  Go to counseling   Return this summer as previously scheduled

## 2013-05-03 NOTE — Progress Notes (Signed)
Jeffrey Soto is a 68 y.o. male     SUBJECTIVE:     elsewhere bp recently  155/94 , 174/103 , 155/92   Today pulse 108 at home     4-5 mos feeling cold.      Mild Dysphoria   X 9 mos . Discussed his stress.   Denies psychosis.   Past Medical History   Diagnosis Date   ??? HTN (hypertension) 12/16/2009   ??? Elevated lipids 12/16/2009   ??? HX OTHER MEDICAL      cubital tunnel,left elbow   ??? Depression    ??? Fatty liver    ??? Gallbladder polyp    ??? GERD (gastroesophageal reflux disease) 06/26/03     EGD: duodenitis , dilation of Esophageal ring , DR. [name]   ??? Arrhythmia      Hx PAT. dr. Canary BrimSperry evaluated.      Current Outpatient Prescriptions   Medication Sig Dispense Refill   ??? lansoprazole (PREVACID) 15 mg capsule Take  by mouth Daily (before breakfast).       ??? atorvastatin (LIPITOR) 10 mg tablet Take 1 Tab by mouth nightly.  30 Tab  12   ??? CARTIA XT 300 mg ER capsule TAKE ONE CAPSULE DAILY  30 Cap  12   ??? lisinopril (PRINIVIL, ZESTRIL) 20 mg tablet TAKE ONE TABLET EVERY MORNING  30 Tab  12   ??? aspirin delayed-release 81 mg tablet Take  by mouth daily.         No Known Allergies  .  History   Smoking status   ??? Current Some Day Smoker   ??? Types: Pipe   Smokeless tobacco   ??? Not on file       Review of Systems  Review of Systems - negative except as per HPI      Physical Examination:  BP 122/80    Ht 6\' 2"  (1.88 m)    Wt 219 lb (99.338 kg)    BMI 28.11 kg/m2    rt arm   Physical Exam  Left arm 120 /80  General: Appears in no acute distress  No goiter   Lung-clear  Heart - reg Jeffrey Soto/o mgr   Abdomen bs+ soft , nt  Extremities - no ankle edema     LDL 96  CBC nl      Assessment/Plan    ICD-9-CM    1. HTN (hypertension) 401.9 AMB POC LIPID PROFILE     TSH, 3RD GENERATION     METABOLIC PANEL, COMPREHENSIVE     PR COLLECTION VENOUS BLOOD,VENIPUNCTURE     AMB POC COMPLETE CBC,AUTOMATED ENTER   2. Elevated lipids 272.4 AMB POC LIPID PROFILE     TSH, 3RD GENERATION     METABOLIC PANEL, COMPREHENSIVE     PR COLLECTION VENOUS  BLOOD,VENIPUNCTURE     AMB POC COMPLETE CBC,AUTOMATED ENTER   3. Encounter for long-term (current) use of other medications V58.69 AMB POC LIPID PROFILE     TSH, 3RD GENERATION     METABOLIC PANEL, COMPREHENSIVE     PR COLLECTION VENOUS BLOOD,VENIPUNCTURE     AMB POC COMPLETE CBC,AUTOMATED ENTER   4. Need for prophylactic vaccination against Streptococcus pneumoniae (pneumococcus) V03.82 PNEUMOCOCCAL POLYSACCHARIDE VACCINE, 23-VALENT, ADULT OR IMMUNOSUPPRESSED PT DOSE,       HTN , Lipids  Well controlled. Same rx    Advised counseling for dyphoria.   Malaise , check labs.   Pneumovax given   rv 6 m for cpe or prn  Author:  Jill Side, MD 11:24 AM3/05/2013

## 2013-05-04 LAB — METABOLIC PANEL, COMPREHENSIVE
A-G Ratio: 1.8 (ref 1.1–2.5)
ALT (SGPT): 34 IU/L (ref 0–44)
AST (SGOT): 28 IU/L (ref 0–40)
Albumin: 5.1 g/dL — ABNORMAL HIGH (ref 3.6–4.8)
Alk. phosphatase: 64 IU/L (ref 39–117)
BUN/Creatinine ratio: 14 (ref 10–22)
BUN: 15 mg/dL (ref 8–27)
Bilirubin, total: 0.7 mg/dL (ref 0.0–1.2)
CO2: 17 mmol/L — ABNORMAL LOW (ref 18–29)
Calcium: 9.6 mg/dL (ref 8.6–10.2)
Chloride: 99 mmol/L (ref 97–108)
Creatinine: 1.04 mg/dL (ref 0.76–1.27)
GFR est AA: 85 mL/min/{1.73_m2} (ref >59)
GFR est non-AA: 74 mL/min/{1.73_m2} (ref >59)
GLOBULIN, TOTAL: 2.8 g/dL (ref 1.5–4.5)
Glucose: 100 mg/dL — ABNORMAL HIGH (ref 65–99)
Potassium: 4 mmol/L (ref 3.5–5.2)
Protein, total: 7.9 g/dL (ref 6.0–8.5)
Sodium: 141 mmol/L (ref 134–144)

## 2013-05-04 LAB — TSH 3RD GENERATION: TSH: 2.64 u[IU]/mL (ref 0.450–4.500)

## 2013-05-05 NOTE — Progress Notes (Signed)
Quick Note:    Pc. Gave lab results. Drink more water. Check BMP(V58.69) on 05/10/13-lab only  ______

## 2013-05-08 NOTE — Progress Notes (Signed)
Scheduled appt

## 2013-05-10 NOTE — Progress Notes (Signed)
bmp 

## 2013-05-11 LAB — METABOLIC PANEL, BASIC
BUN/Creatinine ratio: 14 (ref 10–22)
BUN: 13 mg/dL (ref 8–27)
CO2: 22 mmol/L (ref 18–29)
Calcium: 9.2 mg/dL (ref 8.6–10.2)
Chloride: 103 mmol/L (ref 97–108)
Creatinine: 0.96 mg/dL (ref 0.76–1.27)
GFR est AA: 94 mL/min/{1.73_m2} (ref >59)
GFR est non-AA: 81 mL/min/{1.73_m2} (ref >59)
Glucose: 102 mg/dL — ABNORMAL HIGH (ref 65–99)
Potassium: 4.1 mmol/L (ref 3.5–5.2)
Sodium: 141 mmol/L (ref 134–144)

## 2013-05-14 NOTE — Progress Notes (Signed)
Quick Note:    L/M results. Continue to drink sufficient amts of water.  ______

## 2013-06-28 MED ORDER — LISINOPRIL 20 MG TAB
20 mg | ORAL_TABLET | ORAL | Status: DC
Start: 2013-06-28 — End: 2013-07-14

## 2013-06-28 MED ORDER — DILTIAZEM ER 300 MG 24 HR CAP
300 mg | ORAL_CAPSULE | ORAL | Status: DC
Start: 2013-06-28 — End: 2013-12-29

## 2013-07-10 MED ORDER — ATORVASTATIN 10 MG TAB
10 mg | ORAL_TABLET | Freq: Every evening | ORAL | Status: DC
Start: 2013-07-10 — End: 2013-07-14

## 2013-07-14 MED ORDER — ATORVASTATIN 10 MG TAB
10 mg | ORAL_TABLET | Freq: Every evening | ORAL | Status: DC
Start: 2013-07-14 — End: 2014-07-30

## 2013-07-14 MED ORDER — LISINOPRIL 20 MG TAB
20 mg | ORAL_TABLET | ORAL | Status: DC
Start: 2013-07-14 — End: 2014-07-30

## 2013-08-30 LAB — AMB POC LIPID PROFILE
Cholesterol (POC): 165 mg/dL (ref 100–199)
HDL Cholesterol (POC): 34 mg/dL — AB (ref 35–150)
LDL Cholesterol (POC): 101 mg/dL (ref 0–129)
Non-HDL Goal (POC): 131
TChol/HDL Ratio (POC): 4.9 CALC (ref 0.0–5.0)
Triglycerides (POC): 153 mg/dL — AB (ref 0–150)

## 2013-08-30 LAB — AMB POC URINALYSIS DIP STICK AUTO W/O MICRO
Bilirubin (UA POC): NEGATIVE
Blood (UA POC): NEGATIVE
Glucose (UA POC): NEGATIVE
Ketones (UA POC): NEGATIVE
Leukocyte esterase (UA POC): NEGATIVE
Nitrites (UA POC): NEGATIVE
Protein (UA POC): NEGATIVE mg/dL
Specific gravity (UA POC): 1.025 (ref 1.001–1.035)
Urobilinogen (UA POC): 0.2 (ref 0.2–1)
pH (UA POC): 6 (ref 4.6–8.0)

## 2013-08-30 NOTE — Patient Instructions (Addendum)
Same medicines  Return in 6 months  Low cholesterol diet   TDAP shot at the pharmacy   Eye check up with eye professional   Return in 6 months

## 2013-08-30 NOTE — Progress Notes (Signed)
Jeffrey Soto is a 68 y.o. male    HPI     Tick bites between  1st and 2nd  toes ( left 3 wks ago and rt 4 d ago ) healing well.     Past Medical History   Diagnosis Date   ??? HTN (hypertension) 12/16/2009   ??? Elevated lipids 12/16/2009   ??? HX OTHER MEDICAL      cubital tunnel,left elbow   ??? Depression    ??? Fatty liver    ??? Gallbladder polyp    ??? GERD (gastroesophageal reflux disease) 06/26/03     EGD: duodenitis , dilation of Esophageal ring , DR. [name]   ??? Arrhythmia      Hx PAT. dr. Canary BrimSperry evaluated.      Past Surgical History   Procedure Laterality Date   ??? Hx urological       right orchiectomy for benign growth   ??? Hx gi       right inguinal hernia repair x3   ??? Hx other surgical       removal pilonidal cyst   ??? Endoscopy, colon, diagnostic  06/26/03     diverticulosis, polypectomy, f/u dr. Ardine Engiehl      Current Outpatient Prescriptions   Medication Sig Dispense Refill   ??? atorvastatin (LIPITOR) 10 mg tablet Take 1 Tab by mouth nightly. 90 Tab 3   ??? lisinopril (PRINIVIL, ZESTRIL) 20 mg tablet TAKE ONE TABLET EVERY MORNING 90 Tab 3   ??? diltiazem CD (CARTIA XT) 300 mg ER capsule TAKE ONE CAPSULE DAILY 30 Cap 5   ??? lansoprazole (PREVACID) 15 mg capsule Take  by mouth Daily (before breakfast).     ??? aspirin delayed-release 81 mg tablet Take  by mouth daily.       No Known Allergies  Family History   Problem Relation Age of Onset   ??? Cancer Sister      lung   ??? Cancer Sister      lung   ??? Lung Disease Father      emphysema   ??? Heart Disease Brother 52     sudden death  in sleep     History   Smoking status   ??? Current Some Day Smoker   ??? Types: Pipe   Smokeless tobacco   ??? Not on file       Review of Systems  Feels deconditioned.   Cardiac:  Denies chest pain  GI: Bowels move regularly without hematochezia.Marland Kitchen.  Urination fine , stream slower vs yesteryear.   Chronic   minor ankle sock indentation at end of the day , more noticeable in hot weather   Ankle dermatitis -   Dr.Kitces is following expectatnly    GERD well controlled       Physical Examination:  BP 120/80 mmHg   Ht 6\' 2"  (1.88 m)   Wt 223 lb (101.152 kg)   BMI 28.62 kg/m2  Physical Exam  General:   Appears in no acute distress.   HEENT:   Negative                   Lungs:   Clear        Heart:  Regular without murmur, gallop or rub       Abdomen:   Benign exam without organomegaly or mass palpable  GU : nl genitalia Ledford Goodson/o hernia                 Rectal:  smooth  prostate, heme negative, no mass palpable   Extremities: No edema ankle : pink raised lesions feet : 2+ DP pulse .  Left 1-2 interspace scab; rt 1-2  :tick bite looks clean    Neurologic: Grossly nonfocal      EKG: Sinus Bradycardia. RSR V1 . Non diagnostic . Probably WNL  Non HDL : 131   Urinalysis neg    Assessment/Plan    ICD-9-CM    1. Essential hypertension 401.9 AMB POC LIPID PROFILE     METABOLIC PANEL, COMPREHENSIVE     AMB POC URINALYSIS DIP STICK AUTO Brad Lieurance/O MICRO     AMB POC EKG ROUTINE Aleyah Balik/ 12 LEADS, INTER & REP     PR COLLECTION VENOUS BLOOD,VENIPUNCTURE     PROSTATE SPECIFIC AG (PSA)     CBC WITH AUTOMATED DIFF     CANCELED: AMB POC COMPLETE CBC,AUTOMATED ENTER   2. Encounter for long-term (current) use of other medications V58.69 AMB POC LIPID PROFILE     METABOLIC PANEL, COMPREHENSIVE     AMB POC URINALYSIS DIP STICK AUTO Craigory Toste/O MICRO     AMB POC EKG ROUTINE Curtistine Pettitt/ 12 LEADS, INTER & REP     PR COLLECTION VENOUS BLOOD,VENIPUNCTURE     PROSTATE SPECIFIC AG (PSA)     CBC WITH AUTOMATED DIFF     CANCELED: AMB POC COMPLETE CBC,AUTOMATED ENTER   3. Special screening for malignant neoplasm of prostate V76.44 AMB POC LIPID PROFILE     METABOLIC PANEL, COMPREHENSIVE     AMB POC URINALYSIS DIP STICK AUTO Shriley Joffe/O MICRO     AMB POC EKG ROUTINE Kasidi Shanker/ 12 LEADS, INTER & REP     PR COLLECTION VENOUS BLOOD,VENIPUNCTURE     PROSTATE SPECIFIC AG (PSA)     CBC WITH AUTOMATED DIFF     CANCELED: AMB POC COMPLETE CBC,AUTOMATED ENTER   4. Elevated lipids 272.4 AMB POC LIPID PROFILE     METABOLIC PANEL, COMPREHENSIVE      AMB POC URINALYSIS DIP STICK AUTO Luvern Mischke/O MICRO     AMB POC EKG ROUTINE Romen Yutzy/ 12 LEADS, INTER & REP     PR COLLECTION VENOUS BLOOD,VENIPUNCTURE     PROSTATE SPECIFIC AG (PSA)     CBC WITH AUTOMATED DIFF     CANCELED: AMB POC COMPLETE CBC,AUTOMATED ENTER   5. Tick bite 919.4 AMB POC LIPID PROFILE    E906.4 METABOLIC PANEL, COMPREHENSIVE     AMB POC URINALYSIS DIP STICK AUTO Raekwon Winkowski/O MICRO     AMB POC EKG ROUTINE Carzell Saldivar/ 12 LEADS, INTER & REP     PR COLLECTION VENOUS BLOOD,VENIPUNCTURE     PROSTATE SPECIFIC AG (PSA)     CBC WITH AUTOMATED DIFF     CANCELED: AMB POC COMPLETE CBC,AUTOMATED ENTER     Health problems responding to tx. Continue same rx.   Advised TDAP   RV 6 m or prn sooner    Author:  Ysidro EvertW Clifford Hendrix IV, MD 10:11 AM7/03/2013

## 2013-08-31 LAB — METABOLIC PANEL, COMPREHENSIVE
A-G Ratio: 2 (ref 1.1–2.5)
ALT (SGPT): 24 IU/L (ref 0–44)
AST (SGOT): 25 IU/L (ref 0–40)
Albumin: 4.7 g/dL (ref 3.6–4.8)
Alk. phosphatase: 56 IU/L (ref 39–117)
BUN/Creatinine ratio: 22 (ref 10–22)
BUN: 19 mg/dL (ref 8–27)
Bilirubin, total: 0.7 mg/dL (ref 0.0–1.2)
CO2: 20 mmol/L (ref 18–29)
Calcium: 9.3 mg/dL (ref 8.6–10.2)
Chloride: 102 mmol/L (ref 97–108)
Creatinine: 0.86 mg/dL (ref 0.76–1.27)
GFR est AA: 104 mL/min/{1.73_m2} (ref >59)
GFR est non-AA: 90 mL/min/{1.73_m2} (ref >59)
GLOBULIN, TOTAL: 2.3 g/dL (ref 1.5–4.5)
Glucose: 95 mg/dL (ref 65–99)
Potassium: 4.4 mmol/L (ref 3.5–5.2)
Protein, total: 7 g/dL (ref 6.0–8.5)
Sodium: 139 mmol/L (ref 134–144)

## 2013-08-31 LAB — CBC WITH AUTOMATED DIFF
ABS. BASOPHILS: 0 10*3/uL (ref 0.0–0.2)
ABS. EOSINOPHILS: 0.2 10*3/uL (ref 0.0–0.4)
ABS. IMM. GRANS.: 0 10*3/uL (ref 0.0–0.1)
ABS. MONOCYTES: 0.7 10*3/uL (ref 0.1–0.9)
ABS. NEUTROPHILS: 2.9 10*3/uL (ref 1.4–7.0)
Abs Lymphocytes: 1.8 10*3/uL (ref 0.7–3.1)
BASOPHILS: 1 %
EOSINOPHILS: 3 %
HCT: 42.4 % (ref 37.5–51.0)
HGB: 14.5 g/dL (ref 12.6–17.7)
IMMATURE GRANULOCYTES: 0 %
Lymphocytes: 32 %
MCH: 29.6 pg (ref 26.6–33.0)
MCHC: 34.2 g/dL (ref 31.5–35.7)
MCV: 87 fL (ref 79–97)
MONOCYTES: 13 %
NEUTROPHILS: 51 %
PLATELET: 188 10*3/uL (ref 150–379)
RBC: 4.9 x10E6/uL (ref 4.14–5.80)
RDW: 13.7 % (ref 12.3–15.4)
WBC: 5.6 10*3/uL (ref 3.4–10.8)

## 2013-08-31 LAB — PSA, DIAGNOSTIC (PROSTATE SPECIFIC AG): Prostate Specific Ag: 3.2 ng/mL (ref 0.0–4.0)

## 2013-10-04 DIAGNOSIS — I10 Essential (primary) hypertension: Secondary | ICD-10-CM | POA: Insufficient documentation

## 2013-10-04 DIAGNOSIS — I2510 Atherosclerotic heart disease of native coronary artery without angina pectoris: Secondary | ICD-10-CM | POA: Insufficient documentation

## 2013-12-30 MED ORDER — CARTIA XT 300 MG CAPSULE,EXTENDED RELEASE
300 mg | ORAL_CAPSULE | ORAL | Status: DC
Start: 2013-12-30 — End: 2014-06-01

## 2014-03-07 ENCOUNTER — Encounter: Attending: Internal Medicine | Primary: Internal Medicine

## 2014-06-01 MED ORDER — CARTIA XT 300 MG CAPSULE,EXTENDED RELEASE
300 mg | ORAL_CAPSULE | ORAL | Status: DC
Start: 2014-06-01 — End: 2014-09-27

## 2014-07-30 MED ORDER — LISINOPRIL 20 MG TAB
20 mg | ORAL_TABLET | ORAL | Status: DC
Start: 2014-07-30 — End: 2015-04-27

## 2014-07-30 MED ORDER — ATORVASTATIN 10 MG TAB
10 mg | ORAL_TABLET | ORAL | Status: DC
Start: 2014-07-30 — End: 2015-04-27

## 2014-09-27 ENCOUNTER — Encounter: Attending: Internal Medicine | Primary: Internal Medicine

## 2014-09-27 ENCOUNTER — Ambulatory Visit: Admit: 2014-09-27 | Discharge: 2014-09-27 | Payer: MEDICARE | Attending: Internal Medicine | Primary: Internal Medicine

## 2014-09-27 DIAGNOSIS — K5900 Constipation, unspecified: Secondary | ICD-10-CM

## 2014-09-27 LAB — AMB POC URINALYSIS DIP STICK AUTO W/O MICRO
Bilirubin (UA POC): NEGATIVE
Blood (UA POC): NEGATIVE
Glucose (UA POC): NEGATIVE
Ketones (UA POC): NEGATIVE
Leukocyte esterase (UA POC): NEGATIVE
Nitrites (UA POC): NEGATIVE
Specific gravity (UA POC): 1.03 (ref 1.001–1.035)
Urobilinogen (UA POC): 1 (ref 0.2–1)
pH (UA POC): 6.5 (ref 4.6–8.0)

## 2014-09-27 LAB — AMB POC COMPLETE CBC,AUTOMATED ENTER
ABS. GRANS (POC): 4.8 10*3/uL (ref 1.4–6.5)
ABS. LYMPHS (POC): 2.2 10*3/uL (ref 1.2–3.4)
ABS. MONOS (POC): 0.7 10*3/uL — AB (ref 0.1–0.6)
GRANULOCYTES (POC): 62.8 % (ref 42.2–75.2)
HCT (POC): 44.4 % (ref 35.0–60.0)
HGB (POC): 14.3 g/dL (ref 11–18)
LYMPHOCYTES (POC): 28.6 % (ref 20.5–51.1)
MCH (POC): 28.8 pg (ref 27.0–31.0)
MCHC (POC): 32.3 g/dL — AB (ref 33.0–37.0)
MCV (POC): 89.3 fL (ref 80.0–99.9)
MONOCYTES (POC): 8.6 % (ref 1.7–9.3)
MPV (POC): 7.5 fL — AB (ref 7.8–11)
PLATELET (POC): 194 10*3/uL (ref 150–450)
RBC (POC): 4.97 M/uL (ref 4.00–6.00)
RDW (POC): 13 % (ref 11.6–13.7)
WBC (POC): 7.6 10*3/uL (ref 4.5–10.5)

## 2014-09-27 LAB — AMB POC LIPID PROFILE
Cholesterol (POC): 195 mg/dL (ref 100–199)
HDL Cholesterol (POC): 29 mg/dL — AB (ref 35–150)
LDL Cholesterol (POC): 106 mg/dL (ref 0–129)
Non-HDL Goal (POC): 166
TChol/HDL Ratio (POC): 6.6 CALC — AB (ref 0.0–5.0)
Triglycerides (POC): 296 mg/dL — AB (ref 0–150)

## 2014-09-27 MED ORDER — DILTIAZEM ER 300 MG 24 HR CAP
300 mg | ORAL_CAPSULE | ORAL | Status: AC
Start: 2014-09-27 — End: ?

## 2014-09-27 NOTE — Patient Instructions (Signed)
appt with Dr. Kennyth ArnoldSandu's office is being made.   Gentle shoulder stretches   Return in 1 year or sooner as needed

## 2014-09-27 NOTE — Progress Notes (Signed)
Jeffrey Soto is a 69 y.o. male    HPI     6- 8 mos constipation Jeffrey Soto/o hematochezia . He thinks he has had a colonoscopy since 2005 .   Past Medical History   Diagnosis Date   ??? HTN (hypertension) 12/16/2009   ??? Elevated lipids 12/16/2009   ??? HX OTHER MEDICAL      cubital tunnel,left elbow   ??? Depression    ??? Fatty liver    ??? Gallbladder polyp    ??? GERD (gastroesophageal reflux disease) 06/26/03     EGD: duodenitis , dilation of Esophageal ring , DR. [name]   ??? Arrhythmia      Hx PAT. dr. [name] evaluated.      Past Surgical History   Procedure Laterality Date   ??? Hx urological       right orchiectomy for benign growth   ??? Hx gi       right inguinal hernia repair x3   ??? Hx other surgical       removal pilonidal cyst   ??? Endoscopy, colon, diagnostic  06/26/03     diverticulosis, polypectomy, f/u dr. Ardine Eng      Current Outpatient Prescriptions   Medication Sig Dispense Refill   ??? diphenhydrAMINE (BENADRYL) 50 mg tablet Take 50 mg by mouth nightly.     ??? diltiazem CD (CARTIA XT) 300 mg ER capsule TAKE ONE CAPSULE BY MOUTH DAILY 90 Cap 3   ??? lisinopril (PRINIVIL, ZESTRIL) 20 mg tablet TAKE ONE TABLET BY MOUTH EVERY MORNING 90 Tab 2   ??? atorvastatin (LIPITOR) 10 mg tablet TAKE ONE TABLET BY MOUTH EVERY EVENING 90 Tab 2   ??? lansoprazole (PREVACID) 15 mg capsule Take  by mouth Daily (before breakfast).     ??? aspirin delayed-release 81 mg tablet Take  by mouth daily.       No Known Allergies  Family History   Problem Relation Age of Onset   ??? Cancer Sister      lung   ??? Cancer Sister      lung   ??? Lung Disease Father      emphysema   ??? Heart Disease Brother 52     sudden death  in sleep     History   Smoking status   ??? Current Some Day Smoker   ??? Types: Pipe   Smokeless tobacco   ??? Not on file       Review of Systems  Denies dyspnea., cough problem   Cardiac:  Denies chest pain, palpitations EA:[id number] indigestion  Urination : stable. Nocturia 0-3 /night depending on fluid intake.      3 wk ago : reach left shoulder pain ; pain now reaching behind low back at times.   Remote shoulder injury ? L ?R     Physical Examination:  BP 122/78 mmHg   Ht  (1.88 m)   Wt 227 lb (102.967 kg)   BMI 29.13 kg/m2  Physical Exam  General:   Appears in no acute distress.   HEENT:   Negative                   Lungs:   Clear        Heart:  Regular without murmur, gallop or rub       Abdomen:   Benign exam without organomegaly or mass palpable                Rectal:  Extremities: Left shoulder : +drop sign . FROM .    No ankle edema      Neurologic: Grossly nonfocal    EKG: NSR. Left Axis   Non HDL : 166  CBC nl  Urinalysis 1+ protein       Assessment/Plan    ICD-10-CM ICD-9-CM    1. Constipation, unspecified constipation type K59.00 564.00 diphenhydrAMINE (BENADRYL) 50 mg tablet      AMB POC LIPID PROFILE      METABOLIC PANEL, COMPREHENSIVE      AMB POC EKG ROUTINE Jeffrey Soto/ 12 LEADS, INTER & REP      PR COLLECTION VENOUS BLOOD,VENIPUNCTURE      AMB POC COMPLETE CBC,AUTOMATED ENTER      PROSTATE SPECIFIC AG (PSA)      AMB POC URINALYSIS DIP STICK AUTO Jeffrey Soto/O MICRO   2. Essential hypertension, hypertension with unspecified goal I10 401.9 diphenhydrAMINE (BENADRYL) 50 mg tablet      AMB POC LIPID PROFILE      METABOLIC PANEL, COMPREHENSIVE      AMB POC EKG ROUTINE Jeffrey Soto/ 12 LEADS, INTER & REP      PR COLLECTION VENOUS BLOOD,VENIPUNCTURE      AMB POC COMPLETE CBC,AUTOMATED ENTER      PROSTATE SPECIFIC AG (PSA)      AMB POC URINALYSIS DIP STICK AUTO Jeffrey Soto/O MICRO   3. Encounter for long-term (current) drug use Z79.899 V58.69 diphenhydrAMINE (BENADRYL) 50 mg tablet      AMB POC LIPID PROFILE      METABOLIC PANEL, COMPREHENSIVE      AMB POC EKG ROUTINE Jeffrey Soto/ 12 LEADS, INTER & REP      PR COLLECTION VENOUS BLOOD,VENIPUNCTURE      AMB POC COMPLETE CBC,AUTOMATED ENTER      PROSTATE SPECIFIC AG (PSA)      AMB POC URINALYSIS DIP STICK AUTO Jeffrey Soto/O MICRO   4. Special screening for malignant neoplasm of prostate Z12.5 V76.44  diphenhydrAMINE (BENADRYL) 50 mg tablet      AMB POC LIPID PROFILE      METABOLIC PANEL, COMPREHENSIVE      AMB POC EKG ROUTINE Jeffrey Soto/ 12 LEADS, INTER & REP      PR COLLECTION VENOUS BLOOD,VENIPUNCTURE      AMB POC COMPLETE CBC,AUTOMATED ENTER      PROSTATE SPECIFIC AG (PSA)      AMB POC URINALYSIS DIP STICK AUTO Jeffrey Soto/O MICRO   5. Elevated lipids E78.5 272.4 diphenhydrAMINE (BENADRYL) 50 mg tablet      AMB POC LIPID PROFILE      METABOLIC PANEL, COMPREHENSIVE      AMB POC EKG ROUTINE Jeffrey Soto/ 12 LEADS, INTER & REP      PR COLLECTION VENOUS BLOOD,VENIPUNCTURE      AMB POC COMPLETE CBC,AUTOMATED ENTER      PROSTATE SPECIFIC AG (PSA)      AMB POC URINALYSIS DIP STICK AUTO Jeffrey Soto/O MICRO   6. Rotator cuff tendinitis, left M75.82 726.10       Constipation . Check TSH and referred to GI   HTN well controlled.   Lipids . P: low chol diet and same rx   Advised gentle shoulder stretches.   AVS reviewed Jeffrey Soto/ pt.   RV 1 y or prn sooner       Author:  Ysidro EvertW Clifford Hendrix IV, MD 2:36 PM7/28/2016

## 2014-09-28 LAB — METABOLIC PANEL, COMPREHENSIVE
A-G Ratio: 2 (ref 1.1–2.5)
ALT (SGPT): 36 IU/L (ref 0–44)
AST (SGOT): 31 IU/L (ref 0–40)
Albumin: 4.9 g/dL — ABNORMAL HIGH (ref 3.6–4.8)
Alk. phosphatase: 63 IU/L (ref 39–117)
BUN/Creatinine ratio: 16 (ref 10–22)
BUN: 16 mg/dL (ref 8–27)
Bilirubin, total: 0.7 mg/dL (ref 0.0–1.2)
CO2: 23 mmol/L (ref 18–29)
Calcium: 9.4 mg/dL (ref 8.6–10.2)
Chloride: 100 mmol/L (ref 97–108)
Creatinine: 0.97 mg/dL (ref 0.76–1.27)
GFR est AA: 92 mL/min/{1.73_m2} (ref >59)
GFR est non-AA: 80 mL/min/{1.73_m2} (ref >59)
GLOBULIN, TOTAL: 2.4 g/dL (ref 1.5–4.5)
Glucose: 81 mg/dL (ref 65–99)
Potassium: 4.2 mmol/L (ref 3.5–5.2)
Protein, total: 7.3 g/dL (ref 6.0–8.5)
Sodium: 143 mmol/L (ref 134–144)

## 2014-09-28 LAB — PSA, DIAGNOSTIC (PROSTATE SPECIFIC AG): Prostate Specific Ag: 3.8 ng/mL (ref 0.0–4.0)

## 2014-09-28 MED ORDER — CARTIA XT 300 MG CAPSULE,EXTENDED RELEASE
300 mg | ORAL_CAPSULE | ORAL | Status: DC
Start: 2014-09-28 — End: 2015-09-23

## 2014-09-30 NOTE — Progress Notes (Signed)
Quick Note:        Pc. Gave lab results. Advise more hydration and f/u urinalysis next year. Advised low chol diet    ______

## 2014-10-04 ENCOUNTER — Encounter: Attending: Internal Medicine | Primary: Internal Medicine

## 2014-11-12 NOTE — Telephone Encounter (Signed)
Notes faxed

## 2014-11-12 NOTE — Telephone Encounter (Signed)
Sarah from [facility] called and needs the pt's last office notes and labs faxed over  Fax # [phone]

## 2015-04-27 MED ORDER — LISINOPRIL 20 MG TAB
20 mg | ORAL_TABLET | ORAL | 1 refills | Status: DC
Start: 2015-04-27 — End: 2015-10-29

## 2015-04-27 MED ORDER — ATORVASTATIN 10 MG TAB
10 mg | ORAL_TABLET | ORAL | 1 refills | Status: DC
Start: 2015-04-27 — End: 2015-10-29

## 2015-07-04 ENCOUNTER — Ambulatory Visit: Admit: 2015-07-04 | Payer: MEDICARE | Attending: Internal Medicine | Primary: Internal Medicine

## 2015-07-04 DIAGNOSIS — S161XXA Strain of muscle, fascia and tendon at neck level, initial encounter: Secondary | ICD-10-CM

## 2015-07-04 NOTE — Progress Notes (Signed)
Jeffrey NonesJames A Boehning is a 70 y.o. male     SUBJECTIVE:    1 wk ago , awoke rt post cervical pain radiating  to shoudler .   improved now   Denies cp, sob  3 m ago minor rt shoulder contusion Kately Graffam/ excellent recovery    Mild nasal  Congestion  in Allergy season .   Past Medical History:   Diagnosis Date   ??? Arrhythmia     Hx PAT. dr. Canary BrimSperry evaluated.    ??? Depression    ??? Elevated lipids 12/16/2009   ??? Fatty liver    ??? Gallbladder polyp    ??? GERD (gastroesophageal reflux disease) 06/26/03    EGD: duodenitis , dilation of Esophageal ring , DR. [name]   ??? HTN (hypertension) 12/16/2009   ??? HX OTHER MEDICAL     cubital tunnel,left elbow     Current Outpatient Prescriptions   Medication Sig Dispense Refill   ??? atorvastatin (LIPITOR) 10 mg tablet TAKE ONE TABLET BY MOUTH EVERY EVENING 90 Tab 1   ??? lisinopril (PRINIVIL, ZESTRIL) 20 mg tablet TAKE ONE TABLET BY MOUTH EVERY MORNING 90 Tab 1   ??? CARTIA XT 300 mg ER capsule TAKE ONE CAPSULE BY MOUTH DAILY 90 Cap 3   ??? diphenhydrAMINE (BENADRYL) 50 mg tablet Take 50 mg by mouth nightly.     ??? diltiazem CD (CARTIA XT) 300 mg ER capsule TAKE ONE CAPSULE BY MOUTH DAILY 90 Cap 3   ??? lansoprazole (PREVACID) 15 mg capsule Take  by mouth Daily (before breakfast).     ??? aspirin delayed-release 81 mg tablet Take  by mouth daily.       No Known Allergies  .  History   Smoking Status   ??? Current Some Day Smoker   ??? Types: Pipe   Smokeless Tobacco   ??? Not on file       Review of Systems  Review of Systems - negative except as per HPI      Physical Examination:  Visit Vitals   ??? BP 138/84   ??? Wt 232 lb (105.2 kg)   ??? BMI 29.79 kg/m2     Physical Exam    General: Appears in no acute distress  Neck C spine nt   Neck FROM . No neck nodes felt   Rt shoulder minor crepitance  Lung-clear  Heart - reg  Abdomen -  Extremities - shoulders FROM      Assessment/Plan    ICD-10-CM ICD-9-CM    1. Cervical strain, initial encounter S16.1XXA 847.0    2. Essential hypertension I10 401.9       Prob Cervical Strain -better. P: Aleve prn . F/u prn   HTN well controlled.     Follow up sooner prn     Author:  Ysidro EvertW. Clifford Hendrix IV, MD 3:35 PM5/06/2015

## 2015-07-04 NOTE — Patient Instructions (Signed)
Aleve if needed for neck /shoulder pain   Call back as needed

## 2015-09-23 MED ORDER — CARTIA XT 300 MG CAPSULE,EXTENDED RELEASE
300 mg | ORAL_CAPSULE | ORAL | 2 refills | Status: DC
Start: 2015-09-23 — End: 2016-06-23

## 2015-10-16 ENCOUNTER — Encounter: Attending: Internal Medicine | Primary: Internal Medicine

## 2015-10-29 MED ORDER — ATORVASTATIN 10 MG TAB
10 mg | ORAL_TABLET | ORAL | 0 refills | Status: DC
Start: 2015-10-29 — End: 2015-12-02

## 2015-10-29 MED ORDER — LISINOPRIL 20 MG TAB
20 mg | ORAL_TABLET | ORAL | 0 refills | Status: DC
Start: 2015-10-29 — End: 2015-11-28

## 2015-11-05 ENCOUNTER — Telehealth

## 2015-11-05 NOTE — Telephone Encounter (Signed)
Recent episode cp and lightheadedness playing golf . He went to GI and is now referred to Cardiology. I left message for him not to exercise or do heavy lifting until Cardiology evaluates him.

## 2015-11-28 MED ORDER — LISINOPRIL 20 MG TAB
20 mg | ORAL_TABLET | ORAL | 0 refills | Status: DC
Start: 2015-11-28 — End: 2016-05-06

## 2015-12-02 MED ORDER — ATORVASTATIN 10 MG TAB
10 mg | ORAL_TABLET | ORAL | 0 refills | Status: DC
Start: 2015-12-02 — End: 2016-04-17

## 2016-04-17 MED ORDER — ATORVASTATIN 10 MG TAB
10 mg | ORAL_TABLET | ORAL | 0 refills | Status: DC
Start: 2016-04-17 — End: 2016-07-28

## 2016-04-17 NOTE — Telephone Encounter (Signed)
He made an app't for next month, so we did one refill

## 2016-04-17 NOTE — Telephone Encounter (Signed)
Wants to speak with the nurse about why he didn't receive his Atorvastatin

## 2016-05-06 MED ORDER — LISINOPRIL 20 MG TAB
20 mg | ORAL_TABLET | ORAL | 0 refills | Status: DC
Start: 2016-05-06 — End: 2016-07-28

## 2016-05-08 ENCOUNTER — Ambulatory Visit: Admit: 2016-05-08 | Discharge: 2016-05-08 | Payer: MEDICARE | Attending: Internal Medicine | Primary: Internal Medicine

## 2016-05-08 DIAGNOSIS — I10 Essential (primary) hypertension: Secondary | ICD-10-CM

## 2016-05-08 LAB — AMB POC LIPID PROFILE
Cholesterol (POC): 174 mg/dL (ref 100–199)
HDL Cholesterol (POC): 31 mg/dL — AB (ref 35–150)
LDL Cholesterol (POC): 102 mg/dL (ref 0–129)
Non-HDL Goal (POC): 143
TChol/HDL Ratio (POC): 5.5 CALC — AB (ref 0.0–5.0)
Triglycerides (POC): 205 mg/dL — AB (ref 0–150)

## 2016-05-08 LAB — AMB POC COMPLETE CBC,AUTOMATED ENTER
ABS. GRANS (POC): 3.8 10*3/uL (ref 1.4–6.5)
ABS. LYMPHS (POC): 2.3 10*3/uL (ref 1.2–3.4)
ABS. MONOS (POC): 0.7 10*3/uL — AB (ref 0.1–0.6)
GRANULOCYTES (POC): 56.3 % (ref 42.2–75.2)
HCT (POC): 46.5 % (ref 35.0–60.0)
HGB (POC): 15.9 g/dL (ref 11–18)
LYMPHOCYTES (POC): 33.4 % (ref 20.5–51.1)
MCH (POC): 29.5 pg (ref 27.0–31.0)
MCHC (POC): 34.1 g/dL (ref 33.0–37.0)
MCV (POC): 86.4 fL (ref 80.0–99.9)
MONOCYTES (POC): 10.3 % — AB (ref 1.7–9.3)
MPV (POC): 7.8 fL (ref 7.8–11)
PLATELET (POC): 199 10*3/uL (ref 150–450)
RBC (POC): 5.38 M/uL (ref 4.00–6.00)
RDW (POC): 13.4 % (ref 11.6–13.7)
WBC (POC): 6.8 10*3/uL (ref 4.5–10.5)

## 2016-05-08 NOTE — Progress Notes (Signed)
This is an Initial Medicare Annual Wellness Exam (AWV) (Performed 12 months after IPPE or effective date of Medicare Part B enrollment, Once in a lifetime)    I have reviewed the patient's medical history in detail and updated the computerized patient record.     History     Past Medical History:   Diagnosis Date   ??? Arrhythmia     Hx PAT. dr. [name] evaluated.    ??? Depression    ??? Elevated lipids 12/16/2009   ??? Fatty liver    ??? Gallbladder polyp    ??? GERD (gastroesophageal reflux disease) 06/26/03    EGD: duodenitis , dilation of Esophageal ring , DR. [name]   ??? HTN (hypertension) 12/16/2009   ??? HX OTHER MEDICAL     cubital tunnel,left elbow      Past Surgical History:   Procedure Laterality Date   ??? ENDOSCOPY, COLON, DIAGNOSTIC  06/26/03    diverticulosis, polypectomy, f/u dr. Ardine Eng    ??? HX GI      right inguinal hernia repair x3   ??? HX OTHER SURGICAL      removal pilonidal cyst   ??? HX UROLOGICAL      right orchiectomy for benign growth     Current Outpatient Prescriptions   Medication Sig Dispense Refill   ??? lisinopril (PRINIVIL, ZESTRIL) 20 mg tablet TAKE ONE TABLET BY MOUTH EVERY MORNING 90 Tab 0   ??? atorvastatin (LIPITOR) 10 mg tablet TAKE ONE TABLET BY MOUTH EVERY EVENING 90 Tab 0   ??? CARTIA XT 300 mg ER capsule TAKE ONE CAPSULE BY MOUTH DAILY 90 Cap 2   ??? diphenhydrAMINE (BENADRYL) 50 mg tablet Take 50 mg by mouth nightly.     ??? diltiazem CD (CARTIA XT) 300 mg ER capsule TAKE ONE CAPSULE BY MOUTH DAILY 90 Cap 3   ??? lansoprazole (PREVACID) 15 mg capsule Take  by mouth Daily (before breakfast).     ??? aspirin delayed-release 81 mg tablet Take  by mouth daily.       No Known Allergies  Family History   Problem Relation Age of Onset   ??? Cancer Sister      lung   ??? Cancer Sister      lung   ??? Lung Disease Father      emphysema   ??? Heart Disease Brother 52     sudden death  in sleep     Social History   Substance Use Topics   ??? Smoking status: Current Some Day Smoker     Types: Pipe    ??? Smokeless tobacco: Never Used   ??? Alcohol use Yes      Comment: 1 drink/month     Patient Active Problem List   Diagnosis Code   ??? HTN (hypertension) I10   ??? Elevated lipids E78.5   ??? Fatty liver K76.0       Depression Risk Factor Screening:     PHQ over the last two weeks 05/08/2016   Little interest or pleasure in doing things Not at all   Feeling down, depressed or hopeless Not at all   Total Score PHQ 2 0     Alcohol Risk Factor Screening:   You do not drink alcohol or very rarely.    Functional Ability and Level of Safety:     Hearing Loss   The patient wore hearing aids in the past     Activities of Daily Living  The home contains: no safety equipment.  Patient does total self care    Fall Risk  Fall Risk Assessment, last 12 mths 05/08/2016   Able to walk? Yes   Fall in past 12 months? Yes   Fall with injury? No   Number of falls in past 12 months 1   Fall Risk Score 1       Abuse Screen  Patient is not abused    Cognitive Screening   Evaluation of Cognitive Function:  Has your family/caregiver stated any concerns about your memory: no  Normal    Patient Care Team   Patient Care Team:  Elester Apodaca. Oren Sectionlifford Hendrix IV, MD as PCP - General (Internal Medicine)    Assessment/Plan   Education and counseling provided:  Are appropriate based on today's review and evaluation    Diagnoses and all orders for this visit:    1. Essential hypertension    2. Initial Medicare annual wellness visit         Health Maintenance Due   Topic Date Due   ??? DTaP/Tdap/Td series (1 - Tdap) 09/17/2001   ??? Bone Densitometry (Dexa) Screening  04/16/2011   ??? MEDICARE YEARLY EXAM  04/16/2011   ??? GLAUCOMA SCREENING Q2Y  05/08/2015

## 2016-05-08 NOTE — Progress Notes (Signed)
Clarification. Fish Oil was not added. I did recommend weight loss and Shingrix

## 2016-05-08 NOTE — Patient Instructions (Addendum)
Medicare Wellness Visit, Male    The best way to live healthy is to have a healthy lifestyle by eating a well-balanced diet, exercising regularly, limiting alcohol and stopping smoking.    Regular physical exams and screening tests are another way to keep healthy.   Preventive exams provided by your health care provider can find health problems before they become diseases or illnesses. Preventive services including immunizations, screening tests, monitoring and exams can help you take care of your own health.    All people over age 70 should have a pneumovax  and and a prevnar shot to prevent pneumonia. These are once in a lifetime unless you and your provider decide differently.    All people over 65 should have a yearly flu shot and a tetanus vaccine every 10 years.    Screening for diabetes mellitus with a blood sugar test should be done every year.    Glaucoma is a disease of the eye due to increased ocular pressure that can lead to blindness and it should be done every year by an eye professional.    Cardiovascular screening tests that check for elevated lipids (fatty part of blood) which can lead to heart disease and strokes should be done every 5 years.    Colorectal screening that evaluates for blood or polyps in your colon should be done yearly as a stool test or every five years as a flexible sigmoidoscope or every 10 years as a colonoscopy up to age 70.    Men up to age 66 may need a screening blood test for prostate cancer at certain intervals, depending on their personal and family history. This decision is between the patient and his provider.    If you have been a smoker or had family history of abdominal aortic aneurysms, you and your provider may decide to schedule an ultrasound test of your aorta.    Hepatitis C screening is also recommended for anyone born between [id number] through 1965.    A shingles vaccine is also recommended once in a lifetime after age 70.     Your Medicare Wellness Exam is recommended annually.    Here is a list of your current Health Maintenance items with a due date:  Health Maintenance Due   Topic Date Due   ??? DTaP/Tdap/Td  (1 - Tdap) 09/17/2001   ??? Bone Mineral Density   04/16/2011   ??? Annual Well Visit  04/16/2011   ??? Glaucoma Screening   05/08/2015     Shingrix Shingles shot     Try to lose 10 pounds over next 3 months    Same medications     Return in 1 year or sooner as needed

## 2016-05-08 NOTE — Progress Notes (Signed)
Jeffrey NonesJames A Soto is a 71 y.o. male    HPI     Feels well.  GERD well controlled.     Denies cp , palpations.  Nuc Stress test neg 6 m ago     Urination stable. Nocturia 0-1 /night . Slow stream . Recent favorable visit  Steele BergKent Rollins MD       Past Medical History:   Diagnosis Date   ??? Arrhythmia     Hx PAT. dr. Canary BrimSperry evaluated.    ??? Depression    ??? Elevated lipids 12/16/2009   ??? Elevated PSA     Dr. Steele BergKent Rollins    ??? Fatty liver    ??? Gallbladder polyp    ??? GERD (gastroesophageal reflux disease) 06/26/03    EGD: duodenitis , dilation of Esophageal ring , DR. [name]   ??? HTN (hypertension) 12/16/2009   ??? HX OTHER MEDICAL     cubital tunnel,left elbow     Past Surgical History:   Procedure Laterality Date   ??? ENDOSCOPY, COLON, DIAGNOSTIC  06/26/03    diverticulosis, polypectomy, f/u dr. Ardine Engiehl    ??? HX GI      right inguinal hernia repair x3   ??? HX OTHER SURGICAL      removal pilonidal cyst   ??? HX UROLOGICAL      right orchiectomy for benign growth     Current Outpatient Prescriptions   Medication Sig Dispense Refill   ??? lisinopril (PRINIVIL, ZESTRIL) 20 mg tablet TAKE ONE TABLET BY MOUTH EVERY MORNING 90 Tab 0   ??? atorvastatin (LIPITOR) 10 mg tablet TAKE ONE TABLET BY MOUTH EVERY EVENING 90 Tab 0   ??? CARTIA XT 300 mg ER capsule TAKE ONE CAPSULE BY MOUTH DAILY 90 Cap 2   ??? diphenhydrAMINE (BENADRYL) 50 mg tablet Take 50 mg by mouth nightly.     ??? diltiazem CD (CARTIA XT) 300 mg ER capsule TAKE ONE CAPSULE BY MOUTH DAILY 90 Cap 3   ??? lansoprazole (PREVACID) 15 mg capsule Take  by mouth Daily (before breakfast).     ??? aspirin delayed-release 81 mg tablet Take  by mouth daily.       No Known Allergies  Family History   Problem Relation Age of Onset   ??? Cancer Sister      lung   ??? Cancer Sister      lung   ??? Lung Disease Father      emphysema   ??? Heart Disease Brother 52     sudden death  in sleep     History   Smoking Status   ??? Current Some Day Smoker   ??? Types: Pipe   Smokeless Tobacco   ??? Never Used        Review of Systems after fall on left shoulder 2 y ago, has had chronic intermittent left shoulder pain , helped by Aleve 440 mg  Denies pain during golf     Low back strain helped by playing golf     Left Ach Tendonitis, treated by specialist and helped by PT      Physical Examination:  Visit Vitals   ??? BP 139/70   ??? Ht 6\' 2"  (1.88 m)   ??? Wt 230 lb (104.3 kg)   ??? BMI 29.53 kg/m2     Physical Exam  General:   Appears in no acute distress.   HEENT:   Negative  Lungs:   Clear        Heart:  Regular without murmur, gallop or rub       Abdomen:   Benign exam without organomegaly or mass palpable    GU: lone testicle. O/Letticia Bhattacharyya nl genitalia Lula Michaux/o hernia . Marland Kitchen             Rectal:  per Urology   Extremities:  shoulder slght crep; neg impiingement   Lt Achilles Tendon , intact slight tenderness      No  edema   Neurologic: Grossly nonfocal      No results found for this or any previous visit (from the past 8 hour(s)).                Assessment/Plan    ICD-10-CM ICD-9-CM    1. Essential hypertension I10 401.9 AMB POC LIPID PROFILE      METABOLIC PANEL, COMPREHENSIVE      COLLECTION VENOUS BLOOD,VENIPUNCTURE      AMB POC COMPLETE CBC,AUTOMATED ENTER   2. Initial Medicare annual wellness visit Z00.00 V70.0 AMB POC LIPID PROFILE      METABOLIC PANEL, COMPREHENSIVE      COLLECTION VENOUS BLOOD,VENIPUNCTURE      AMB POC COMPLETE CBC,AUTOMATED ENTER   3. Hypercholesterolemia E78.00 272.0 AMB POC LIPID PROFILE      METABOLIC PANEL, COMPREHENSIVE      COLLECTION VENOUS BLOOD,VENIPUNCTURE      AMB POC COMPLETE CBC,AUTOMATED ENTER   4. Encounter for long-term (current) drug use Z79.899 V58.69 AMB POC LIPID PROFILE      METABOLIC PANEL, COMPREHENSIVE      COLLECTION VENOUS BLOOD,VENIPUNCTURE      AMB POC COMPLETE CBC,AUTOMATED ENTER   5. Achilles tendinitis of left lower extremity M76.62 726.71 AMB POC LIPID PROFILE      METABOLIC PANEL, COMPREHENSIVE      COLLECTION VENOUS BLOOD,VENIPUNCTURE       AMB POC COMPLETE CBC,AUTOMATED ENTER   6. Left shoulder pain, unspecified chronicity M25.512 719.41    7. Bilateral low back pain without sciatica, unspecified chronicity M54.5 724.2    8. Elevated PSA R97.20 790.93      1. HTN well controlled. Same rx   2. See Wellness note  3. Non HDL 143. Restart Fish oil . Continue Lipitor   4. Slight ALT elevation probably form hepatic steatosis  5. Achilles Tendonitis. P: supportive shoes.   6. 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 P; sx rx prn   7. Mechanical low back pain. P : stretch.     RV 1 or prn sooner       Author:  Ysidro Evert, MD 10:53 AM3/11/2016

## 2016-05-09 LAB — METABOLIC PANEL, COMPREHENSIVE
A-G Ratio: 1.8 (ref 1.2–2.2)
ALT (SGPT): 39 IU/L (ref 0–44)
AST (SGOT): 27 IU/L (ref 0–40)
Albumin: 4.7 g/dL (ref 3.5–4.8)
Alk. phosphatase: 64 IU/L (ref 39–117)
BUN/Creatinine ratio: 21 (ref 10–24)
BUN: 20 mg/dL (ref 8–27)
Bilirubin, total: 0.8 mg/dL (ref 0.0–1.2)
CO2: 27 mmol/L (ref 18–29)
Calcium: 9.5 mg/dL (ref 8.6–10.2)
Chloride: 100 mmol/L (ref 96–106)
Creatinine: 0.94 mg/dL (ref 0.76–1.27)
GFR est AA: 95 mL/min/{1.73_m2} (ref >59)
GFR est non-AA: 82 mL/min/{1.73_m2} (ref >59)
GLOBULIN, TOTAL: 2.6 g/dL (ref 1.5–4.5)
Glucose: 96 mg/dL (ref 65–99)
Potassium: 4.5 mmol/L (ref 3.5–5.2)
Protein, total: 7.3 g/dL (ref 6.0–8.5)
Sodium: 142 mmol/L (ref 134–144)

## 2016-06-23 MED ORDER — CARTIA XT 300 MG CAPSULE,EXTENDED RELEASE
300 mg | ORAL_CAPSULE | ORAL | 3 refills | Status: DC
Start: 2016-06-23 — End: 2017-07-06

## 2016-07-28 MED ORDER — LISINOPRIL 20 MG TAB
20 mg | ORAL_TABLET | ORAL | 3 refills | Status: DC
Start: 2016-07-28 — End: 2017-07-29

## 2016-07-28 MED ORDER — ATORVASTATIN 10 MG TAB
10 mg | ORAL_TABLET | ORAL | 3 refills | Status: DC
Start: 2016-07-28 — End: 2017-08-25

## 2017-04-16 ENCOUNTER — Ambulatory Visit: Admit: 2017-04-16 | Attending: Internal Medicine | Primary: Internal Medicine

## 2017-04-16 DIAGNOSIS — M5431 Sciatica, right side: Secondary | ICD-10-CM

## 2017-04-16 NOTE — Progress Notes (Signed)
Jeffrey Soto is a 72 y.o. Jeffrey Soto/m CC: Rt sciatica     SUBJECTIVE:    6 Jeffrey Soto rt sciatic hip pain , +interm rt thigh numbness . Flared  Up more  1 week ago with  rt thigh pain   Past Medical History:   Diagnosis Date   ??? Arrhythmia     Hx PAT. dr. Ezequiel Ganser evaluated.    ??? Depression    ??? Elevated lipids 12/16/2009   ??? Elevated PSA     Dr. Ceasar Mons    ??? Fatty liver    ??? Gallbladder polyp    ??? GERD (gastroesophageal reflux disease) 06/26/03    EGD: duodenitis , dilation of Esophageal ring , DR. [name]   ??? HTN (hypertension) 12/16/2009   ??? HX OTHER MEDICAL     cubital tunnel,left elbow     Current Outpatient Medications   Medication Sig Dispense Refill   ??? lisinopril (PRINIVIL, ZESTRIL) 20 mg tablet TAKE ONE TABLET BY MOUTH EVERY MORNING 90 Tab 3   ??? atorvastatin (LIPITOR) 10 mg tablet TAKE ONE TABLET BY MOUTH EVERY EVENING 90 Tab 3   ??? CARTIA XT 300 mg ER capsule TAKE ONE CAPSULE BY MOUTH DAILY 90 Cap 3   ??? diphenhydrAMINE (BENADRYL) 50 mg tablet Take 50 mg by mouth nightly.     ??? diltiazem CD (CARTIA XT) 300 mg ER capsule TAKE ONE CAPSULE BY MOUTH DAILY 90 Cap 3   ??? lansoprazole (PREVACID) 15 mg capsule Take  by mouth Daily (before breakfast).     ??? aspirin delayed-release 81 mg tablet Take  by mouth daily.       No Known Allergies  .  Social History     Tobacco Use   Smoking Status Current Some Day Smoker   ??? Types: Pipe   Smokeless Tobacco Never Used         Review of Systems  Review of Systems - negative except as per HPI    Felt better with stretch.     Cough last month resolved   Physical Examination:  Visit Vitals  BP 122/72   Ht '6\' 1"'  (1.854 m)   Wt 211 lb (95.7 kg)   BMI 27.84 kg/m??     Physical Exam    General: Appears in no acute distress  Lung-clear   Heart - reg  Abdomen -  Extremities - neg right  st leg raise . sign  Hips FROM .     Leg strength LT intact     Labs: No results found for this or any previous visit (from the past 8 hour(s)).    Marland Kitchen   Results for orders placed or performed in visit on 93/23/55    METABOLIC PANEL, COMPREHENSIVE   Result Value Ref Range    Glucose 96 65 - 99 mg/dL    BUN 20 8 - 27 mg/dL    Creatinine 0.94 0.76 - 1.27 mg/dL    GFR est non-AA 82 >59 mL/min/1.73    GFR est AA 95 >59 mL/min/1.73    BUN/Creatinine ratio 21 10 - 24    Sodium 142 134 - 144 mmol/L    Potassium 4.5 3.5 - 5.2 mmol/L    Chloride 100 96 - 106 mmol/L    CO2 27 18 - 29 mmol/L    Calcium 9.5 8.6 - 10.2 mg/dL    Protein, total 7.3 6.0 - 8.5 g/dL    Albumin 4.7 3.5 - 4.8 g/dL    GLOBULIN, TOTAL 2.6  1.5 - 4.5 g/dL    A-G Ratio 1.8 1.2 - 2.2    Bilirubin, total 0.8 0.0 - 1.2 mg/dL    Alk. phosphatase 64 39 - 117 IU/L    AST (SGOT) 27 0 - 40 IU/L    ALT (SGPT) 39 0 - 44 IU/L   AMB POC LIPID PROFILE   Result Value Ref Range    Cholesterol (POC) 174 100 - 199 mg/dL    Triglycerides (POC) 205 (A) 0 - 150 mg/dL    HDL Cholesterol (POC) 31 (A) 35 - 150 mg/dL    LDL Cholesterol (POC) 102 0 - 129 mg/dL    Non-HDL Goal (POC) 143     TChol/HDL Ratio (POC) 5.5 (A) 0.0 - 5.0 CALC   AMB POC COMPLETE CBC,AUTOMATED ENTER   Result Value Ref Range    WBC (POC) 6.8 4.5 - 10.5 K/uL    LYMPHOCYTES (POC) 33.4 20.5 - 51.1 %    MONOCYTES (POC) 10.3 (A) 1.7 - 9.3 %    GRANULOCYTES (POC) 56.3 42.2 - 75.2 %    ABS. LYMPHS (POC) 2.3 1.2 - 3.4 K/uL    ABS. MONOS (POC) 0.7 (A) 0.1 - 0.6 10^3/ul    ABS. GRANS (POC) 3.8 1.4 - 6.5 10^3/ul    RBC (POC) 5.38 4.00 - 6.00 M/uL    HGB (POC) 15.9 11 - 18 g/dL    HCT (POC) 46.5 35.0 - 60.0 %    MCV (POC) 86.4 80.0 - 99.9 fL    MCH (POC) 29.5 27.0 - 31.0 pg    MCHC (POC) 34.1 33.0 - 37.0 g/dL    RDW (POC) 13.4 11.6 - 13.7 %    PLATELET (POC) 199 150 - 450 K/uL    MPV (POC) 7.8 7.8 - 11 fL        Assessment/Plan    ICD-10-CM ICD-9-CM    1. Right sided sciatica M54.31 724.3    2. Essential hypertension I10 401.9    3. Cough R05 786.2      1. P: rest , Tylenol /Aleve prn , gentle back stretches(sheet given)  2. HTN well controlled.   3. Possible URI. Resolved     Follow up PRN.     Author:  Darlina Rumpf, MD 3:40 PM2/16/2019

## 2017-04-16 NOTE — Patient Instructions (Signed)
No golf for 1 week . Do back stretches. Use Tylenol or Aleve as needed     Call back if not improving

## 2017-05-31 ENCOUNTER — Ambulatory Visit: Admit: 2017-05-31 | Attending: Internal Medicine | Primary: Internal Medicine

## 2017-05-31 DIAGNOSIS — I10 Essential (primary) hypertension: Secondary | ICD-10-CM

## 2017-05-31 LAB — AMB POC COMPLETE CBC,AUTOMATED ENTER
ABS. GRANS (POC): 5.6 10*3/uL (ref 1.4–6.5)
ABS. LYMPHS (POC): 1.7 10*3/uL (ref 1.2–3.4)
ABS. MONOS (POC): 0.7 10*3/uL — AB (ref 0.1–0.6)
GRANULOCYTES (POC): 70.6 % (ref 42.2–75.2)
HCT (POC): 42.3 % (ref 35.0–60.0)
HGB (POC): 14.7 g/dL (ref 11–18)
LYMPHOCYTES (POC): 21.2 % (ref 20.5–51.1)
MCH (POC): 30.4 pg (ref 27.0–31.0)
MCHC (POC): 34.7 g/dL (ref 33.0–37.0)
MCV (POC): 87.7 fL (ref 80.0–99.9)
MONOCYTES (POC): 8.2 % (ref 1.7–9.3)
MPV (POC): 7.6 fL — AB (ref 7.8–11)
PLATELET (POC): 234 10*3/uL (ref 150–450)
RBC (POC): 4.83 M/uL (ref 4.00–6.00)
RDW (POC): 12.7 % (ref 11.6–13.7)
WBC (POC): 8 10*3/uL (ref 4.5–10.5)

## 2017-05-31 LAB — AMB POC LIPID PROFILE
Cholesterol (POC): 160 mg/dL (ref 100–199)
HDL Cholesterol (POC): 37 mg/dL (ref 35–150)
LDL Cholesterol (POC): 90 mg/dL (ref 0–129)
Non-HDL Goal (POC): 123
TChol/HDL Ratio (POC): 4.4 CALC (ref 0.0–5.0)
Triglycerides (POC): 166 mg/dL — AB (ref 0–150)

## 2017-05-31 NOTE — Progress Notes (Signed)
Pc. Gave results. F/u prn

## 2017-05-31 NOTE — Patient Instructions (Addendum)
Medicare Wellness Visit, Male    The best way to live healthy is to have a lifestyle where you eat a well-balanced diet, exercise regularly, limit alcohol use, and quit all forms of tobacco/nicotine, if applicable.   Regular preventive services are another way to keep healthy. Preventive services (vaccines, screening tests, monitoring & exams) can help personalize your care plan, which helps you manage your own care. Screening tests can find health problems at the earliest stages, when they are easiest to treat.   [facility] follows the current, evidence-based guidelines published by the [place] [employer] Services Task Force (USPSTF) when recommending preventive services for our patients. Because we follow these guidelines, sometimes recommendations change over time as research supports it. (For example, a prostate screening blood test is no longer routinely recommended for men with no symptoms.)  Of course, you and your doctor may decide to screen more often for some diseases, based on your risk and co-morbidities (chronic disease you are already diagnosed with).   Preventive services for you include:  - Medicare offers their members a free annual wellness visit, which is time for you and your primary care provider to discuss and plan for your preventive service needs. Take advantage of this benefit every year!  -All adults over age 71 should receive the recommended pneumonia vaccines. Current USPSTF guidelines recommend a series of two vaccines for the best pneumonia protection.   -All adults should have a flu vaccine yearly and an ECG. All adults age 71 and older should receive a shingles vaccine once in their lifetime.    -All adults age 71-70 who are overweight should have a diabetes screening test once every three years.   -Other screening tests & preventive services for persons with diabetes include: an eye exam to screen for diabetic retinopathy, a kidney function  test, a foot exam, and stricter control over your cholesterol.   -Cardiovascular screening for adults with routine risk involves an electrocardiogram (ECG) at intervals determined by the provider.   -Colorectal cancer screening should be done for adults age 71-75 with no increased risk factors for colorectal cancer.  There are a number of acceptable methods of screening for this type of cancer. Each test has its own benefits and drawbacks. Discuss with your provider what is most appropriate for you during your annual wellness visit. The different tests include: colonoscopy (considered the best screening method), a fecal occult blood test, a fecal DNA test, and sigmoidoscopy.  -All adults born between 1945 and 1965 should be screened once for Hepatitis C.  -An Abdominal Aortic Aneurysm (AAA) Screening is recommended for men age 71-75 who has ever smoked in their lifetime.     Here is a list of your current Health Maintenance items (your personalized list of preventive services) with a due date:  Health Maintenance Due   Topic Date Due   ??? Shingles Vaccine (1 of 2) 04/15/1996   ??? DTaP/Tdap/Td  (1 - Tdap) 09/17/2001   ??? Glaucoma Screening   05/08/2015   ??? Annual Well Visit  05/09/2017     Prevnar 13 Pneumonia shot in the pharmacy when feeling well.     Avoid lying down after eating. Avoid eating after 7 pm     See Dr. Selena BattenKim about back and hip pain     Return here in 1 year for next Physical or sooner as needed

## 2017-05-31 NOTE — Progress Notes (Signed)
Jeffrey Soto is a 72 y.o. Jeffrey Soto/m: CC; Rt hip pain     HPI    2 m rt sciatic  Hip pain . DR  Norlene Duel ordered  PT; pt got mild rt poplital pain improving . B/l shoulder pain pushing out of chair , Meloxicam helped. Today rt buttock pain   Past Medical History:   Diagnosis Date   ??? Arrhythmia     Hx PAT. dr. [name] evaluated.    ??? Depression    ??? Elevated lipids 12/16/2009   ??? Elevated PSA     Dr. [name]    ??? Fatty liver    ??? Gallbladder polyp    ??? GERD (gastroesophageal reflux disease) 06/26/03    EGD: duodenitis , dilation of Esophageal ring , DR. [name]   ??? HTN (hypertension) 12/16/2009   ??? HX OTHER MEDICAL     cubital tunnel,left elbow     Past Surgical History:   Procedure Laterality Date   ??? ENDOSCOPY, COLON, DIAGNOSTIC  06/26/03    diverticulosis, polypectomy, f/u dr. Ardine Eng    ??? HX GI      right inguinal hernia repair x3   ??? HX OTHER SURGICAL      removal pilonidal cyst   ??? HX UROLOGICAL      right orchiectomy for benign growth   ??? HX UROLOGICAL  05/25/2017    Prostate biopsy by [name] MD     Current Outpatient Medications   Medication Sig Dispense Refill   ??? meloxicam (MOBIC) 15 mg tablet Take 15 mg by mouth daily as needed for Pain.     ??? lisinopril (PRINIVIL, ZESTRIL) 20 mg tablet TAKE ONE TABLET BY MOUTH EVERY MORNING 90 Tab 3   ??? atorvastatin (LIPITOR) 10 mg tablet TAKE ONE TABLET BY MOUTH EVERY EVENING 90 Tab 3   ??? CARTIA XT 300 mg ER capsule TAKE ONE CAPSULE BY MOUTH DAILY 90 Cap 3   ??? diphenhydrAMINE (BENADRYL) 50 mg tablet Take 50 mg by mouth nightly.     ??? diltiazem CD (CARTIA XT) 300 mg ER capsule TAKE ONE CAPSULE BY MOUTH DAILY 90 Cap 3   ??? lansoprazole (PREVACID) 15 mg capsule Take  by mouth Daily (before breakfast).     ??? aspirin delayed-release 81 mg tablet Take  by mouth daily.       No Known Allergies  Family History   Problem Relation Age of Onset   ??? Cancer Sister         lung   ??? Cancer Sister         lung   ??? Lung Disease Father         emphysema   ??? Heart Disease Brother 52         sudden death  in sleep     Social History     Tobacco Use   Smoking Status Current Some Day Smoker   ??? Types: Pipe   Smokeless Tobacco Never Used       Review of Systems  While Singing sometimes, gets sob.denies cough    Cardiac:  Denies exertional  chest pain  GI:  Hx GERD;  at times chest pain lying down. Bowels move regularly without hematochezia.. After ;ast week's lprostate bx , perianal  itch and slight bleed .  Urination slow stream chronically .   Mild frustration Jeffrey Soto/ health but passes Depression screen. Denies falling down in past 1 y    Physical Examination:  Visit Vitals  BP 134/78   Ht 6\' 1"  (1.854 m)   Wt 209 lb (94.8 kg)   BMI 27.57 kg/m??     Physical Exam  General:   Appears in no acute distress.   HEENT:   Negative                   Lungs:   Clear        Heart:  Regular without murmur, gallop or rub       Abdomen:   Benign exam without organomegaly or mass palpable    GU: nl genitalia Jeffrey Soto/o hernia            Rectal:  perianal slight irritation. No active bleeding seen    Extremities: Minor shoulder crepitance b/l  Shoulders mild decr rom   Hips FROM  Neg rt st leg raise sign  No edema   Neurologic: Grossly nonfocal    EKG: NSR. Left Axis         No results found for this or any previous visit (from the past 8 hour(s)).              Results for orders placed or performed in visit on 05/31/17   AMB POC LIPID PROFILE   Result Value Ref Range    Cholesterol (POC) 160 100 - 199 mg/dL    Triglycerides (POC) 166 (A) 0 - 150 mg/dL    HDL Cholesterol (POC) 37 35 - 150 mg/dL    LDL Cholesterol (POC) 90 0 - 129 mg/dL    Non-HDL Goal (POC) 981123     TChol/HDL Ratio (POC) 4.4 0.0 - 5.0 CALC   AMB POC COMPLETE CBC,AUTOMATED ENTER   Result Value Ref Range    WBC (POC) 8.0 4.5 - 10.5 K/uL    LYMPHOCYTES (POC) 21.2 20.5 - 51.1 %    MONOCYTES (POC) 8.2 1.7 - 9.3 %    GRANULOCYTES (POC) 70.6 42.2 - 75.2 %    ABS. LYMPHS (POC) 1.7 1.2 - 3.4 K/uL    ABS. MONOS (POC) 0.7 (A) 0.1 - 0.6 10^3/ul     ABS. GRANS (POC) 5.6 1.4 - 6.5 10^3/ul    RBC (POC) 4.83 4.00 - 6.00 M/uL    HGB (POC) 14.7 11 - 18 g/dL    HCT (POC) 19.142.3 47.835.0 - 60.0 %    MCV (POC) 87.7 80.0 - 99.9 fL    MCH (POC) 30.4 27.0 - 31.0 pg    MCHC (POC) 34.7 33.0 - 37.0 g/dL    RDW (POC) 29.512.7 62.111.6 - 13.7 %    PLATELET (POC) 234 150 - 450 K/uL    MPV (POC) 7.6 (A) 7.8 - 11 fL     Assessment/Plan    ICD-10-CM ICD-9-CM    1. Essential hypertension I10 401.9 AMB POC LIPID PROFILE      METABOLIC PANEL, COMPREHENSIVE      AMB POC EKG ROUTINE Jeffrey Soto/ 12 LEADS, INTER & REP      COLLECTION VENOUS BLOOD,VENIPUNCTURE      AMB POC COMPLETE CBC,AUTOMATED ENTER   2. Right sided sciatica M54.31 724.3 AMB POC LIPID PROFILE      METABOLIC PANEL, COMPREHENSIVE      AMB POC EKG ROUTINE Jeffrey Soto/ 12 LEADS, INTER & REP      COLLECTION VENOUS BLOOD,VENIPUNCTURE      AMB POC COMPLETE CBC,AUTOMATED ENTER   3. Hypercholesterolemia E78.00 272.0 AMB POC LIPID PROFILE      METABOLIC PANEL, COMPREHENSIVE      AMB  POC EKG ROUTINE Jeffrey Soto/ 12 LEADS, INTER & REP      COLLECTION VENOUS BLOOD,VENIPUNCTURE      AMB POC COMPLETE CBC,AUTOMATED ENTER   4. Medicare annual wellness visit, subsequent Z00.00 V70.0 AMB POC LIPID PROFILE      METABOLIC PANEL, COMPREHENSIVE      AMB POC EKG ROUTINE Jeffrey Soto/ 12 LEADS, INTER & REP      COLLECTION VENOUS BLOOD,VENIPUNCTURE      AMB POC COMPLETE CBC,AUTOMATED ENTER   5. Encounter for long-term (current) drug use Z79.899 V58.69 AMB POC LIPID PROFILE      METABOLIC PANEL, COMPREHENSIVE      AMB POC EKG ROUTINE Jeffrey Soto/ 12 LEADS, INTER & REP      COLLECTION VENOUS BLOOD,VENIPUNCTURE      AMB POC COMPLETE CBC,AUTOMATED ENTER   6. Acute pain of both shoulders M25.511 719.41     M25.512     7. Gastroesophageal reflux disease, esophagitis presence not specified K21.9 530.81      Rt sciatica.  B/l shoulder mm strain +/- underlying DJD.   P:  Use Meloxicam sparingly as possible.  HTN, lipids well controlled.   GERD controlled. Same rx    RV 1 y here or prn sooner      Author:  Ysidro Evert, MD 8:46 AM4/03/2017

## 2017-05-31 NOTE — Progress Notes (Signed)
This is the Subsequent Medicare Annual Wellness Exam, performed 12 months or more after the Initial AWV or the last Subsequent AWV    I have reviewed the patient's medical history in detail and updated the computerized patient record.     History     Past Medical History:   Diagnosis Date   ??? Arrhythmia     Hx PAT. dr. Canary BrimSperry evaluated.    ??? Depression    ??? Elevated lipids 12/16/2009   ??? Elevated PSA     Dr. Steele BergKent Rollins    ??? Fatty liver    ??? Gallbladder polyp    ??? GERD (gastroesophageal reflux disease) 06/26/03    EGD: duodenitis , dilation of Esophageal ring , DR. [name]   ??? HTN (hypertension) 12/16/2009   ??? HX OTHER MEDICAL     cubital tunnel,left elbow      Past Surgical History:   Procedure Laterality Date   ??? ENDOSCOPY, COLON, DIAGNOSTIC  06/26/03    diverticulosis, polypectomy, f/u dr. Ardine Engiehl    ??? HX GI      right inguinal hernia repair x3   ??? HX OTHER SURGICAL      removal pilonidal cyst   ??? HX UROLOGICAL      right orchiectomy for benign growth   ??? HX UROLOGICAL  05/25/2017    Prostate biopsy by [name] MD     Current Outpatient Medications   Medication Sig Dispense Refill   ??? meloxicam (MOBIC) 15 mg tablet Take 15 mg by mouth daily as needed for Pain.     ??? lisinopril (PRINIVIL, ZESTRIL) 20 mg tablet TAKE ONE TABLET BY MOUTH EVERY MORNING 90 Tab 3   ??? atorvastatin (LIPITOR) 10 mg tablet TAKE ONE TABLET BY MOUTH EVERY EVENING 90 Tab 3   ??? CARTIA XT 300 mg ER capsule TAKE ONE CAPSULE BY MOUTH DAILY 90 Cap 3   ??? diphenhydrAMINE (BENADRYL) 50 mg tablet Take 50 mg by mouth nightly.     ??? diltiazem CD (CARTIA XT) 300 mg ER capsule TAKE ONE CAPSULE BY MOUTH DAILY 90 Cap 3   ??? lansoprazole (PREVACID) 15 mg capsule Take  by mouth Daily (before breakfast).     ??? aspirin delayed-release 81 mg tablet Take  by mouth daily.       No Known Allergies  Family History   Problem Relation Age of Onset   ??? Cancer Sister         lung   ??? Cancer Sister         lung   ??? Lung Disease Father         emphysema    ??? Heart Disease Brother 52        sudden death  in sleep     Social History     Tobacco Use   ??? Smoking status: Current Some Day Smoker     Types: Pipe   ??? Smokeless tobacco: Never Used   Substance Use Topics   ??? Alcohol use: Yes     Comment: 1 drink/month     Patient Active Problem List   Diagnosis Code   ??? HTN (hypertension) I10   ??? Elevated lipids E78.5   ??? Fatty liver K76.0   ??? Elevated PSA R97.20       Depression Risk Factor Screening:     3 most recent PHQ Screens 05/31/2017   Little interest or pleasure in doing things Not at all   Feeling down, depressed, irritable, or hopeless  Not at all   Total Score PHQ 2 0     Alcohol Risk Factor Screening:   You do not drink alcohol or very rarely.    Functional Ability and Level of Safety:   Hearing Loss  The patient needs further evaluation.    Activities of Daily Living  The home contains: grab bars  Patient does total self care    Fall Risk  Fall Risk Assessment, last 12 mths 05/31/2017   Able to walk? Yes   Fall in past 12 months? No   Fall with injury? -   Number of falls in past 12 months -   Fall Risk Score -       Abuse Screen  Patient is not abused    Cognitive Screening   Evaluation of Cognitive Function:  Has your family/caregiver stated any concerns about your memory: no  Normal    Patient Care Team   Patient Care Team:  Ysidro Evert, MD as PCP - General (Internal Medicine)    Assessment/Plan   Education and counseling provided:  Are appropriate based on today's review and evaluation    Diagnoses and all orders for this visit:    1. Essential hypertension    2. Right sided sciatica    3. Hypercholesterolemia        Health Maintenance Due   Topic Date Due   ??? Shingrix Vaccine Age 58> (1 of 2) 04/15/1996   ??? DTaP/Tdap/Td series (1 - Tdap) 09/17/2001   ??? GLAUCOMA SCREENING Q2Y  05/08/2015   ??? MEDICARE YEARLY EXAM  05/09/2017

## 2017-06-01 LAB — METABOLIC PANEL, COMPREHENSIVE
A-G Ratio: 1.7 (ref 1.2–2.2)
ALT (SGPT): 11 IU/L (ref 0–44)
AST (SGOT): 14 IU/L (ref 0–40)
Albumin: 4.5 g/dL (ref 3.5–4.8)
Alk. phosphatase: 70 IU/L (ref 39–117)
BUN/Creatinine ratio: 20 (ref 10–24)
BUN: 17 mg/dL (ref 8–27)
Bilirubin, total: 0.5 mg/dL (ref 0.0–1.2)
CO2: 22 mmol/L (ref 20–29)
Calcium: 9.5 mg/dL (ref 8.6–10.2)
Chloride: 101 mmol/L (ref 96–106)
Creatinine: 0.85 mg/dL (ref 0.76–1.27)
GFR est AA: 101 mL/min/{1.73_m2} (ref >59)
GFR est non-AA: 88 mL/min/{1.73_m2} (ref >59)
GLOBULIN, TOTAL: 2.7 g/dL (ref 1.5–4.5)
Glucose: 93 mg/dL (ref 65–99)
Potassium: 4.1 mmol/L (ref 3.5–5.2)
Protein, total: 7.2 g/dL (ref 6.0–8.5)
Sodium: 141 mmol/L (ref 134–144)

## 2017-07-05 NOTE — Telephone Encounter (Signed)
Pt off Lipitor only 3 days. Call back here in 2 weeks with progress. F/u Dr. Norlene Duel. Ortho

## 2017-07-05 NOTE — Telephone Encounter (Signed)
Pt would like to inform the nurse that he has stopped the medication and has not seen a change     Ph# [phone]

## 2017-07-05 NOTE — Telephone Encounter (Signed)
He stopped taking cholesterol med's. hasn't noticed a difference. Maybe just a little better.

## 2017-07-05 NOTE — Telephone Encounter (Signed)
Pt would like to speak with the nurse please, he sated he has stopped taking on of his medications     Ph# [phone]

## 2017-07-05 NOTE — Telephone Encounter (Signed)
Called patient back @ 10:10am

## 2017-07-06 MED ORDER — CARTIA XT 300 MG CAPSULE,EXTENDED RELEASE
300 mg | ORAL_CAPSULE | ORAL | 2 refills | Status: DC
Start: 2017-07-06 — End: 2018-04-04

## 2017-07-29 MED ORDER — LISINOPRIL 20 MG TAB
20 mg | ORAL_TABLET | ORAL | 2 refills | Status: DC
Start: 2017-07-29 — End: 2018-04-26

## 2017-08-23 ENCOUNTER — Inpatient Hospital Stay: Admit: 2017-08-23 | Payer: MEDICARE | Attending: Internal Medicine | Primary: Internal Medicine

## 2017-08-23 ENCOUNTER — Ambulatory Visit: Admit: 2017-08-23 | Attending: Internal Medicine | Primary: Internal Medicine

## 2017-08-23 ENCOUNTER — Ambulatory Visit: Attending: Internal Medicine | Primary: Internal Medicine

## 2017-08-23 DIAGNOSIS — M659 Synovitis and tenosynovitis, unspecified: Secondary | ICD-10-CM

## 2017-08-23 MED ORDER — TRIAMCINOLONE ACETONIDE 10 MG/ML SUSP FOR INJECTION
10 mg/mL | Freq: Once | INTRAMUSCULAR | 0 refills | Status: AC
Start: 2017-08-23 — End: 2017-08-23

## 2017-08-23 MED ORDER — HYDROCODONE-ACETAMINOPHEN 7.5 MG-325 MG TAB
ORAL_TABLET | Freq: Four times a day (QID) | ORAL | 0 refills | Status: AC | PRN
Start: 2017-08-23 — End: 2017-08-26

## 2017-08-23 NOTE — Progress Notes (Signed)
[id number] golf despite bil shoulder pain left >r with abd to 80 degrees  In past 36 hours acute synovitis left wrist, no hx of gout psoriasis and ibd. No photo sens no raynauds and neg fm hx of CTD    Physical Examination:  Visit Vitals  BP (!) 120/92   Temp 99.2 ??F (37.3 ??Vivienne Sangiovanni)   Ht 6' 1"  (1.854 m)   Wt 209 lb (94.8 kg)   BMI 27.57 kg/m??     General:  HEENT:  NECK:  Lungs:  Heart:  Breasts:  Abdomen:  Rectal:  Extremities:  Neurologic:    Prior to Admission medications    Medication Sig Start Date End Date Taking? Authorizing Provider   HYDROcodone-acetaminophen (NORCO) 7.5-325 mg per tablet Take 1 Tab by mouth every six (6) hours as needed for Pain for up to 3 days. Max Daily Amount: 4 Tabs. 08/23/17 08/26/17 Yes Kandis Nab, MD   triamcinolone acetonide (KENALOG) 10 mg/mL injection 2 mL by Intra artICUlar route once for 1 dose. 08/23/17 08/23/17 Yes Kandis Nab, MD   lisinopril (PRINIVIL, ZESTRIL) 20 mg tablet TAKE ONE TABLET BY MOUTH EVERY MORNING 07/29/17   Mamie Levers IV, MD   CARTIA XT 300 mg ER capsule TAKE ONE CAPSULE BY MOUTH DAILY 07/06/17   Mamie Levers IV, MD   meloxicam [facility]) 15 mg tablet Take 15 mg by mouth daily as needed for Pain.    Provider, Historical   atorvastatin (LIPITOR) 10 mg tablet TAKE ONE TABLET BY MOUTH EVERY EVENING 07/28/16   Mamie Levers IV, MD   diphenhydrAMINE (BENADRYL) 50 mg tablet Take 50 mg by mouth nightly.    Provider, Historical   diltiazem CD (CARTIA XT) 300 mg ER capsule TAKE ONE CAPSULE BY MOUTH DAILY 09/27/14   Mamie Levers IV, MD   lansoprazole (PREVACID) 15 mg capsule Take  by mouth Daily (before breakfast).    Provider, Historical   aspirin delayed-release 81 mg tablet Take  by mouth daily.    Provider, Historical     No Known Allergies    RESULTS:  Results for orders placed or performed in visit on 62/13/08   METABOLIC PANEL, COMPREHENSIVE   Result Value Ref Range    Glucose 93 65 - 99 mg/dL    BUN 17 8 - 27 mg/dL    Creatinine 0.85 0.76 -  1.27 mg/dL    GFR est non-AA 88 >59 mL/min/1.73    GFR est AA 101 >59 mL/min/1.73    BUN/Creatinine ratio 20 10 - 24    Sodium 141 134 - 144 mmol/L    Potassium 4.1 3.5 - 5.2 mmol/L    Chloride 101 96 - 106 mmol/L    CO2 22 20 - 29 mmol/L    Calcium 9.5 8.6 - 10.2 mg/dL    Protein, total 7.2 6.0 - 8.5 g/dL    Albumin 4.5 3.5 - 4.8 g/dL    GLOBULIN, TOTAL 2.7 1.5 - 4.5 g/dL    A-G Ratio 1.7 1.2 - 2.2    Bilirubin, total 0.5 0.0 - 1.2 mg/dL    Alk. phosphatase 70 39 - 117 IU/L    AST (SGOT) 14 0 - 40 IU/L    ALT (SGPT) 11 0 - 44 IU/L   AMB POC LIPID PROFILE   Result Value Ref Range    Cholesterol (POC) 160 100 - 199 mg/dL    Triglycerides (POC) 166 (A) 0 - 150 mg/dL    HDL Cholesterol (POC)  37 35 - 150 mg/dL    LDL Cholesterol (POC) 90 0 - 129 mg/dL    Non-HDL Goal (POC) 123     TChol/HDL Ratio (POC) 4.4 0.0 - 5.0 CALC   AMB POC COMPLETE CBC,AUTOMATED ENTER   Result Value Ref Range    WBC (POC) 8.0 4.5 - 10.5 K/uL    LYMPHOCYTES (POC) 21.2 20.5 - 51.1 %    MONOCYTES (POC) 8.2 1.7 - 9.3 %    GRANULOCYTES (POC) 70.6 42.2 - 75.2 %    ABS. LYMPHS (POC) 1.7 1.2 - 3.4 K/uL    ABS. MONOS (POC) 0.7 (A) 0.1 - 0.6 10^3/ul    ABS. GRANS (POC) 5.6 1.4 - 6.5 10^3/ul    RBC (POC) 4.83 4.00 - 6.00 M/uL    HGB (POC) 14.7 11 - 18 g/dL    HCT (POC) 42.3 35.0 - 60.0 %    MCV (POC) 87.7 80.0 - 99.9 fL    MCH (POC) 30.4 27.0 - 31.0 pg    MCHC (POC) 34.7 33.0 - 37.0 g/dL    RDW (POC) 12.7 11.6 - 13.7 %    PLATELET (POC) 234 150 - 450 K/uL    MPV (POC) 7.6 (A) 7.8 - 11 fL     Wrist xray no chondrocalcinosis  Assessment/Plan:    ICD-10-CM ICD-9-CM    1. Synovitis M65.9 727.00 XR WRIST LT AP/LAT      RHEUMATOID FACTOR, QL      CYCLIC CITRUL PEPTIDE AB, IGG      ANA W/REFLEX      COLLECTION VENOUS BLOOD,VENIPUNCTURE      HYDROcodone-acetaminophen (NORCO) 7.5-325 mg per tablet   2. Wrist tendonitis M77.8 727.05 triamcinolone acetonide (KENALOG) 10 mg/mL injection      PR DRAIN/INJECT INTERMEDIATE JOINT/BURSA   left wrist inj kenalog 62m and  xylocaine  Aleve or mobic  Discussed pseudogout and shoulder hand synrome  Follow [facility]:CWCBback    Author: CKandis Nab MD 4:41 PM 08/23/2017

## 2017-08-23 NOTE — Progress Notes (Signed)
GUR:KYHCWC golf despite bil shoulder pain left >r with abd to 80 degrees  In past 36 hours acute synovitis left wrist, no hx of gout psoriasis and ibd. No photo sens no raynauds and neg fm hx of CTD    Physical Examination:  Visit Vitals  BP (!) 120/92   Temp 99.2 ??F (37.3 ??Sharone Picchi)   Ht '6\' 1"'  (1.854 m)   Wt 209 lb (94.8 kg)   BMI 27.57 kg/m??     General:  HEENT:  NECK:  Lungs:  Heart:  Breasts:  Abdomen:  Rectal:  Extremities:  Neurologic:    Prior to Admission medications    Medication Sig Start Date End Date Taking? Authorizing Provider   HYDROcodone-acetaminophen (NORCO) 7.5-325 mg per tablet Take 1 Tab by mouth every six (6) hours as needed for Pain for up to 3 days. Max Daily Amount: 4 Tabs. 08/23/17 08/26/17 Yes Kandis Nab, MD   triamcinolone acetonide (KENALOG) 10 mg/mL injection 2 mL by Intra artICUlar route once for 1 dose. 08/23/17 08/23/17 Yes Kandis Nab, MD   lisinopril (PRINIVIL, ZESTRIL) 20 mg tablet TAKE ONE TABLET BY MOUTH EVERY MORNING 07/29/17   Mamie Levers IV, MD   CARTIA XT 300 mg ER capsule TAKE ONE CAPSULE BY MOUTH DAILY 07/06/17   Mamie Levers IV, MD   meloxicam [facility]) 15 mg tablet Take 15 mg by mouth daily as needed for Pain.    Provider, Historical   atorvastatin (LIPITOR) 10 mg tablet TAKE ONE TABLET BY MOUTH EVERY EVENING 07/28/16   Mamie Levers IV, MD   diphenhydrAMINE (BENADRYL) 50 mg tablet Take 50 mg by mouth nightly.    Provider, Historical   diltiazem CD (CARTIA XT) 300 mg ER capsule TAKE ONE CAPSULE BY MOUTH DAILY 09/27/14   Mamie Levers IV, MD   lansoprazole (PREVACID) 15 mg capsule Take  by mouth Daily (before breakfast).    Provider, Historical   aspirin delayed-release 81 mg tablet Take  by mouth daily.    Provider, Historical     No Known Allergies    RESULTS:  Results for orders placed or performed in visit on 37/62/83   METABOLIC PANEL, COMPREHENSIVE   Result Value Ref Range    Glucose 93 65 - 99 mg/dL    BUN 17 8 - 27 mg/dL     Creatinine 0.85 0.76 - 1.27 mg/dL    GFR est non-AA 88 >59 mL/min/1.73    GFR est AA 101 >59 mL/min/1.73    BUN/Creatinine ratio 20 10 - 24    Sodium 141 134 - 144 mmol/L    Potassium 4.1 3.5 - 5.2 mmol/L    Chloride 101 96 - 106 mmol/L    CO2 22 20 - 29 mmol/L    Calcium 9.5 8.6 - 10.2 mg/dL    Protein, total 7.2 6.0 - 8.5 g/dL    Albumin 4.5 3.5 - 4.8 g/dL    GLOBULIN, TOTAL 2.7 1.5 - 4.5 g/dL    A-G Ratio 1.7 1.2 - 2.2    Bilirubin, total 0.5 0.0 - 1.2 mg/dL    Alk. phosphatase 70 39 - 117 IU/L    AST (SGOT) 14 0 - 40 IU/L    ALT (SGPT) 11 0 - 44 IU/L   AMB POC LIPID PROFILE   Result Value Ref Range    Cholesterol (POC) 160 100 - 199 mg/dL    Triglycerides (POC) 166 (A) 0 - 150 mg/dL    HDL Cholesterol (POC)  37 35 - 150 mg/dL    LDL Cholesterol (POC) 90 0 - 129 mg/dL    Non-HDL Goal (POC) 123     TChol/HDL Ratio (POC) 4.4 0.0 - 5.0 CALC   AMB POC COMPLETE CBC,AUTOMATED ENTER   Result Value Ref Range    WBC (POC) 8.0 4.5 - 10.5 K/uL    LYMPHOCYTES (POC) 21.2 20.5 - 51.1 %    MONOCYTES (POC) 8.2 1.7 - 9.3 %    GRANULOCYTES (POC) 70.6 42.2 - 75.2 %    ABS. LYMPHS (POC) 1.7 1.2 - 3.4 K/uL    ABS. MONOS (POC) 0.7 (A) 0.1 - 0.6 10^3/ul    ABS. GRANS (POC) 5.6 1.4 - 6.5 10^3/ul    RBC (POC) 4.83 4.00 - 6.00 M/uL    HGB (POC) 14.7 11 - 18 g/dL    HCT (POC) 42.3 35.0 - 60.0 %    MCV (POC) 87.7 80.0 - 99.9 fL    MCH (POC) 30.4 27.0 - 31.0 pg    MCHC (POC) 34.7 33.0 - 37.0 g/dL    RDW (POC) 12.7 11.6 - 13.7 %    PLATELET (POC) 234 150 - 450 K/uL    MPV (POC) 7.6 (A) 7.8 - 11 fL     Wrist xray no chondrocalcinosis  Assessment/Plan:    ICD-10-CM ICD-9-CM    1. Synovitis M65.9 727.00 XR WRIST LT AP/LAT      RHEUMATOID FACTOR, QL      CYCLIC CITRUL PEPTIDE AB, IGG      ANA W/REFLEX      COLLECTION VENOUS BLOOD,VENIPUNCTURE      HYDROcodone-acetaminophen (NORCO) 7.5-325 mg per tablet   2. Wrist tendonitis M77.8 727.05 triamcinolone acetonide (KENALOG) 10 mg/mL injection      PR DRAIN/INJECT INTERMEDIATE JOINT/BURSA    left wrist inj kenalog 57m and xylocaine  Aleve or mobic  Discussed pseudogout and shoulder hand synrome  Follow [facility]:QMVHback    Author: CKandis Nab MD 4:41 PM 08/23/2017

## 2017-08-25 LAB — RHEUMATOID FACTOR, QL: Rheumatoid factor: 10.5 IU/mL (ref 0.0–13.9)

## 2017-08-25 LAB — CYCLIC CITRUL PEPTIDE AB, IGG
CCP ANTIBODIES IGG/IGA: 7 units (ref 0–19)
CCP Antibodies IgG/IgA: 7 units (ref 0–19)

## 2017-08-25 LAB — ANA, RFLX TO 5-BIOMARKER PROFILE(ENA)
ANA, DIRECT: NEGATIVE
Antinuclear Antibodies Direct: NEGATIVE

## 2017-08-25 LAB — RHEUMATOID FACTOR, QUALITATIVE: Rheumatoid Factor: 10.5 IU/mL (ref 0.0–13.9)

## 2017-08-25 MED ORDER — ATORVASTATIN 10 MG TAB
10 mg | ORAL_TABLET | ORAL | 2 refills | Status: DC
Start: 2017-08-25 — End: 2017-08-25

## 2017-08-25 MED ORDER — ATORVASTATIN 10 MG TAB
10 mg | ORAL_TABLET | ORAL | 2 refills | Status: DC
Start: 2017-08-25 — End: 2018-07-29

## 2017-08-25 NOTE — Progress Notes (Signed)
Left wrist much improved probable pseudogout

## 2017-08-25 NOTE — Progress Notes (Signed)
Left wrist much improved probable pseudogout

## 2017-08-25 NOTE — Telephone Encounter (Signed)
rx refilled

## 2017-08-25 NOTE — Telephone Encounter (Signed)
Patient needs a new order for his    Atorvastatin 10 mg tab 1 tab every evening    [facility] ph# [phone]    Pt ph# [phone]

## 2017-08-26 MED ORDER — ATORVASTATIN 10 MG TAB
10 mg | ORAL_TABLET | ORAL | 2 refills | Status: DC
Start: 2017-08-26 — End: 2018-07-29

## 2017-09-01 ENCOUNTER — Ambulatory Visit: Admit: 2017-09-01 | Attending: Internal Medicine | Primary: Internal Medicine

## 2017-09-01 ENCOUNTER — Ambulatory Visit: Attending: Internal Medicine | Primary: Internal Medicine

## 2017-09-01 DIAGNOSIS — M7551 Bursitis of right shoulder: Secondary | ICD-10-CM

## 2017-09-01 MED ORDER — TRIAMCINOLONE ACETONIDE 40 MG/ML SUSP FOR INJECTION
40 mg/mL | Freq: Once | INTRAMUSCULAR | 0 refills | Status: AC
Start: 2017-09-01 — End: 2017-09-01

## 2017-09-01 NOTE — Progress Notes (Signed)
AVW:[id number]:left wrist cont to be improved with sl swell after cortisone injection Now increasing bil shoulder pain L>R with from No other synovitis    Physical Examination:  Visit Vitals  BP 126/80   Ht 6\' 1"  (1.854 m)   Wt 212 lb (96.2 kg)   BMI 27.97 kg/m??     General:  HEENT:  NECK:  Lungs:  Heart:  Breasts:  Abdomen:  Rectal:  Extremities:  Neurologic:    Prior to Admission medications    Medication Sig Start Date End Date Taking? Authorizing Provider   triamcinolone acetonide (KENALOG) 40 mg/mL injection 1 mL by Intra artICUlar route once for 1 dose. 09/01/17 09/01/17 Yes Prudencio Pairitus, Channon Brougher Kent, MD   triamcinolone acetonide (KENALOG) 40 mg/mL injection 1 mL by Intra artICUlar route once for 1 dose. 09/01/17 09/01/17 Yes Prudencio Pairitus, Kerriann Kamphuis Kent, MD   atorvastatin (LIPITOR) 10 mg tablet TAKE ONE TABLET BY MOUTH EVERY EVENING 08/26/17   Coralyn PearHendrix, W. Clifford IV, MD   atorvastatin (LIPITOR) 10 mg tablet TAKE ONE TABLET BY MOUTH EVERY EVENING 08/25/17   Coralyn PearHendrix, W. Clifford IV, MD   lisinopril (PRINIVIL, ZESTRIL) 20 mg tablet TAKE ONE TABLET BY MOUTH EVERY MORNING 07/29/17   Coralyn PearHendrix, W. Clifford IV, MD   CARTIA XT 300 mg ER capsule TAKE ONE CAPSULE BY MOUTH DAILY 07/06/17   Coralyn PearHendrix, W. Clifford IV, MD   meloxicam (MOBIC) 15 mg tablet Take 15 mg by mouth daily as needed for Pain.    Provider, Historical   diphenhydrAMINE (BENADRYL) 50 mg tablet Take 50 mg by mouth nightly.    Provider, Historical   diltiazem CD (CARTIA XT) 300 mg ER capsule TAKE ONE CAPSULE BY MOUTH DAILY 09/27/14   Coralyn PearHendrix, W. Clifford IV, MD   lansoprazole (PREVACID) 15 mg capsule Take  by mouth Daily (before breakfast).    Provider, Historical   aspirin delayed-release 81 mg tablet Take  by mouth daily.    Provider, Historical     No Known Allergies    RESULTS:  Results for orders placed or performed in visit on 08/23/17   RHEUMATOID FACTOR, QL   Result Value Ref Range    Rheumatoid factor 10.5 0.0 - 13.9 IU/mL   CYCLIC CITRUL PEPTIDE AB, IGG   Result Value Ref Range    CCP Antibodies  IgG/IgA 7 0 - 19 units   ANA W/REFLEX   Result Value Ref Range    Antinuclear Antibodies Direct Negative Negative       Assessment/Plan:    ICD-10-CM ICD-9-CM    1. Bursitis of right shoulder M75.51 726.10 triamcinolone acetonide (KENALOG) 40 mg/mL injection      PR DRAIN/INJECT LARGE JOINT/BURSA   2. 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

## 2017-09-01 NOTE — Progress Notes (Signed)
RUE:[facility]:left wrist cont to be improved with sl swell after cortisone injection Now increasing bil shoulder pain L>R with from No other synovitis    Physical Examination:  Visit Vitals  BP 126/80   Ht 6\' 1"  (1.854 m)   Wt 212 lb (96.2 kg)   BMI 27.97 kg/m??     General:  HEENT:  NECK:  Lungs:  Heart:  Breasts:  Abdomen:  Rectal:  Extremities:  Neurologic:    Prior to Admission medications    Medication Sig Start Date End Date Taking? Authorizing Provider   triamcinolone acetonide (KENALOG) 40 mg/mL injection 1 mL by Intra artICUlar route once for 1 dose. 09/01/17 09/01/17 Yes Prudencio Pairitus, Senia Even Kent, MD   triamcinolone acetonide (KENALOG) 40 mg/mL injection 1 mL by Intra artICUlar route once for 1 dose. 09/01/17 09/01/17 Yes Prudencio Pairitus, Rosalyn Archambault Kent, MD   atorvastatin (LIPITOR) 10 mg tablet TAKE ONE TABLET BY MOUTH EVERY EVENING 08/26/17   Coralyn PearHendrix, W. Clifford IV, MD   atorvastatin (LIPITOR) 10 mg tablet TAKE ONE TABLET BY MOUTH EVERY EVENING 08/25/17   Coralyn PearHendrix, W. Clifford IV, MD   lisinopril (PRINIVIL, ZESTRIL) 20 mg tablet TAKE ONE TABLET BY MOUTH EVERY MORNING 07/29/17   Coralyn PearHendrix, W. Clifford IV, MD   CARTIA XT 300 mg ER capsule TAKE ONE CAPSULE BY MOUTH DAILY 07/06/17   Coralyn PearHendrix, W. Clifford IV, MD   meloxicam (MOBIC) 15 mg tablet Take 15 mg by mouth daily as needed for Pain.    Provider, Historical   diphenhydrAMINE (BENADRYL) 50 mg tablet Take 50 mg by mouth nightly.    Provider, Historical   diltiazem CD (CARTIA XT) 300 mg ER capsule TAKE ONE CAPSULE BY MOUTH DAILY 09/27/14   Coralyn PearHendrix, W. Clifford IV, MD   lansoprazole (PREVACID) 15 mg capsule Take  by mouth Daily (before breakfast).    Provider, Historical   aspirin delayed-release 81 mg tablet Take  by mouth daily.    Provider, Historical     No Known Allergies    RESULTS:  Results for orders placed or performed in visit on 08/23/17   RHEUMATOID FACTOR, QL   Result Value Ref Range    Rheumatoid factor 10.5 0.0 - 13.9 IU/mL   CYCLIC CITRUL PEPTIDE AB, IGG   Result Value Ref Range     CCP Antibodies IgG/IgA 7 0 - 19 units   ANA W/REFLEX   Result Value Ref Range    Antinuclear Antibodies Direct Negative Negative       Assessment/Plan:    ICD-10-CM ICD-9-CM    1. Bursitis of right shoulder M75.51 726.10 triamcinolone acetonide (KENALOG) 40 mg/mL injection      PR DRAIN/INJECT LARGE JOINT/BURSA   2. 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

## 2018-04-04 MED ORDER — CARTIA XT 300 MG CAPSULE,EXTENDED RELEASE
300 mg | ORAL_CAPSULE | ORAL | 1 refills | Status: DC
Start: 2018-04-04 — End: 2018-09-27

## 2018-04-26 MED ORDER — LISINOPRIL 20 MG TAB
20 mg | ORAL_TABLET | ORAL | 0 refills | Status: DC
Start: 2018-04-26 — End: 2018-07-27

## 2018-04-26 NOTE — Telephone Encounter (Signed)
Sent 90- note to pharmacy that pt needs appt soon.

## 2018-04-26 NOTE — Telephone Encounter (Signed)
Pt would like a refill on his      Lisinopril 20 mg      Ph# [phone]  [facility] Ph# [phone]

## 2018-05-04 ENCOUNTER — Encounter

## 2018-05-04 ENCOUNTER — Inpatient Hospital Stay: Admit: 2018-05-04 | Payer: MEDICARE | Attending: Pulmonary Disease | Primary: Internal Medicine

## 2018-05-04 DIAGNOSIS — R0602 Shortness of breath: Secondary | ICD-10-CM

## 2018-05-25 NOTE — Telephone Encounter (Signed)
Pt would like to speak with the Dr about his Atorvastatin, he stated that he has been taking 1 tablet sense he has been taking the prescription and he just looked at the bottle and it now says 2 tablets, he would like to know what he needs to be taking and if the Dr changed that or if the Pharmacy made a mistake     Ph# [phone]

## 2018-05-25 NOTE — Telephone Encounter (Signed)
Called pt ; take Lipitor 10 mg daily . F/u prn

## 2018-06-06 ENCOUNTER — Ambulatory Visit: Attending: Internal Medicine | Primary: Internal Medicine

## 2018-06-06 DIAGNOSIS — I10 Essential (primary) hypertension: Secondary | ICD-10-CM

## 2018-06-06 NOTE — Progress Notes (Signed)
No show

## 2018-07-27 MED ORDER — LISINOPRIL 20 MG TAB
20 mg | ORAL_TABLET | ORAL | 0 refills | Status: DC
Start: 2018-07-27 — End: 2018-08-09

## 2018-07-29 ENCOUNTER — Ambulatory Visit: Admit: 2018-07-29 | Attending: Internal Medicine | Primary: Internal Medicine

## 2018-07-29 ENCOUNTER — Ambulatory Visit: Attending: Internal Medicine | Primary: Internal Medicine

## 2018-07-29 DIAGNOSIS — I10 Essential (primary) hypertension: Secondary | ICD-10-CM

## 2018-07-29 MED ORDER — TRAMADOL 50 MG TAB
50 mg | ORAL_TABLET | Freq: Every day | ORAL | 0 refills | Status: DC | PRN
Start: 2018-07-29 — End: 2018-08-23

## 2018-07-29 NOTE — Progress Notes (Signed)
Jeffrey Soto is a 73 y.o. 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 Shoulder pain and wrist pain responded to infrequent Norco 7.5 mg which we discussed.  Fingers feel  tight but makes fist  Denies sig am sitffness  Past Medical History:   Diagnosis Date   ??? Arrhythmia     Hx PAT. dr. Ezequiel Ganser evaluated.    ??? Depression    ??? Elevated lipids 12/16/2009   ??? Elevated PSA     Dr. Ceasar Mons    ??? Fatty liver    ??? Gallbladder polyp    ??? GERD (gastroesophageal reflux disease) 06/26/03    EGD: duodenitis , dilation of Esophageal ring , DR. [name]   ??? HTN (hypertension) 12/16/2009   ??? HX OTHER MEDICAL     cubital tunnel,left elbow   ??? Prostate cancer ([place]) 06/02/2017    Low grade by report . 2019 . Dr. Ceasar Mons      Current Outpatient Medications   Medication Sig Dispense Refill   ??? atorvastatin (Lipitor) 10 mg tablet Take  by mouth daily.     ??? traMADoL (ULTRAM) 50 mg tablet Take 1 Tab by mouth daily as needed for Pain for up to 20 days. 20 Tab 0   ??? lisinopriL (PRINIVIL, ZESTRIL) 20 mg tablet TAKE ONE TABLET BY MOUTH EVERY MORNING 10 Tab 0   ??? CARTIA XT 300 mg ER capsule TAKE ONE CAPSULE BY MOUTH DAILY 90 Cap 1   ??? meloxicam (MOBIC) 15 mg tablet Take 15 mg by mouth daily as needed for Pain.     ??? diphenhydrAMINE (BENADRYL) 50 mg tablet Take 50 mg by mouth nightly.     ??? diltiazem CD (CARTIA XT) 300 mg ER capsule TAKE ONE CAPSULE BY MOUTH DAILY 90 Cap 3   ??? lansoprazole (PREVACID) 15 mg capsule Take  by mouth Daily (before breakfast).     ??? aspirin delayed-release 81 mg tablet Take  by mouth daily.       No Known Allergies  .  Social History     Tobacco Use   Smoking Status Current Some Day Smoker   ??? Types: Pipe   Smokeless Tobacco Never Used     The patient denies in past 1 yr a history of falls. .  Depression screen .  negative  Review of Systems  Review of Systems - negative except as per HPI    Left > R ankle swell. Ate salt .Left foot tingle at times briefly   Hx GERD    Physical Examination:  Visit Vitals  BP 138/84   Ht 6' 1"  (1.854 m)   Wt 224 lb (101.6 kg)   BMI 29.55 kg/m??     Physical Exam    General: Appears in no acute distress  Lung-clear  Heart -reg Aizen Duval/o mgr    Abdomen -bs+ soft, nt   Extremities - wrist full rom   Shoulder crepitance  and mild decr rom  2+ PT pulses  No pedal edema  LT sens intact in  ankles .  Feet DF/PT stength intact     Labs: No results found for this or any previous visit (from the past 8 hour(s)).    Marland Kitchen   Results for orders placed or performed in visit on 92/11/94   METABOLIC PANEL, COMPREHENSIVE   Result Value Ref Range    Glucose 108 (H) 65 - 99 mg/dL    BUN 16 8 -  27 mg/dL    Creatinine 1.02 0.76 - 1.27 mg/dL    GFR est non-AA 73 >59 mL/min/1.73    GFR est AA 84 >59 mL/min/1.73    BUN/Creatinine ratio 16 10 - 24    Sodium 138 134 - 144 mmol/L    Potassium 4.3 3.5 - 5.2 mmol/L    Chloride 100 96 - 106 mmol/L    CO2 22 20 - 29 mmol/L    Calcium 9.2 8.6 - 10.2 mg/dL    Protein, total 6.9 6.0 - 8.5 g/dL    Albumin 4.5 3.7 - 4.7 g/dL    GLOBULIN, TOTAL 2.4 1.5 - 4.5 g/dL    A-G Ratio 1.9 1.2 - 2.2    Bilirubin, total 0.7 0.0 - 1.2 mg/dL    Alk. phosphatase 71 39 - 117 IU/L    AST (SGOT) 28 0 - 40 IU/L    ALT (SGPT) 36 0 - 44 IU/L   LIPID PANEL   Result Value Ref Range    Cholesterol, total 168 100 - 199 mg/dL    Triglyceride 218 (H) 0 - 149 mg/dL    HDL Cholesterol 30 (L) >39 mg/dL    VLDL, calculated 44 (H) 5 - 40 mg/dL    LDL, calculated 94 0 - 99 mg/dL   CBC WITH AUTOMATED DIFF   Result Value Ref Range    WBC 5.6 3.4 - 10.8 x10E3/uL    RBC 4.97 4.14 - 5.80 x10E6/uL    HGB 14.6 13.0 - 17.7 g/dL    HCT 44.9 37.5 - 51.0 %    MCV 90 79 - 97 fL    MCH 29.4 26.6 - 33.0 pg    MCHC 32.5 31.5 - 35.7 g/dL    RDW 13.2 11.6 - 15.4 %    PLATELET 211 150 - 450 x10E3/uL    NEUTROPHILS 52 Not Estab. %    Lymphocytes 30 Not Estab. %    MONOCYTES 14 Not Estab. %    EOSINOPHILS 3 Not Estab. %    BASOPHILS 1 Not Estab. %    ABS. NEUTROPHILS 2.9 1.4 - 7.0 x10E3/uL     Abs Lymphocytes 1.7 0.7 - 3.1 x10E3/uL    ABS. MONOCYTES 0.8 0.1 - 0.9 x10E3/uL    ABS. EOSINOPHILS 0.2 0.0 - 0.4 x10E3/uL    ABS. BASOPHILS 0.1 0.0 - 0.2 x10E3/uL    IMMATURE GRANULOCYTES 0 Not Estab. %    ABS. IMM. GRANS. 0.0 0.0 - 0.1 x10E3/uL        Assessment/Plan    ICD-10-CM ICD-9-CM    1. Essential hypertension F62 130.8 METABOLIC PANEL, COMPREHENSIVE      COLLECTION VENOUS BLOOD,VENIPUNCTURE      LIPID PANEL      CBC WITH AUTOMATED DIFF      traMADoL (ULTRAM) 50 mg tablet   2. Hypercholesterolemia M57.84 696.2 METABOLIC PANEL, COMPREHENSIVE      COLLECTION VENOUS BLOOD,VENIPUNCTURE      LIPID PANEL      CBC WITH AUTOMATED DIFF      traMADoL (ULTRAM) 50 mg tablet   3. Encounter for long-term (current) use of other medications X52.841 L24.40 METABOLIC PANEL, COMPREHENSIVE      COLLECTION VENOUS BLOOD,VENIPUNCTURE      LIPID PANEL      CBC WITH AUTOMATED DIFF      traMADoL (ULTRAM) 50 mg tablet   4. Pain in finger of both hands M79.645 729.5 traMADoL (ULTRAM) 50 mg tablet    M79.644  SED RATE (ESR)  5. 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 Check ESR. Discussed Tramadol -erx sent  HTN well controlled. Same rx   Lipids controlled. Same rx   Suspect left foot tingling referred from lumbar spine vs related to described intermittent edema.   RV 3 m cpe or prn sooner   .    Author:  Darlina Rumpf, MD 8:52 AM5/31/2020

## 2018-07-29 NOTE — Progress Notes (Signed)
Left VM results. Advised ADA diet , same statin rx, call back here if significant arthralgia or am stiffness . F/u sooner prn

## 2018-07-29 NOTE — Patient Instructions (Signed)
Tramadol as little as possible for joint pain   Avoid salt.   Return in 3 months for a full physical or sooner as needed

## 2018-07-29 NOTE — Progress Notes (Signed)
Jeffrey Soto is a 73 y.o. 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 Shoulder pain and wrist pain responded to infrequent Norco 7.5 mg which we discussed.  Fingers feel  tight but makes fist  Denies sig am sitffness  Past Medical History:   Diagnosis Date   ??? Arrhythmia     Hx PAT. dr. Sperry evaluated.    ??? Depression    ??? Elevated lipids 12/16/2009   ??? Elevated PSA     Dr. Kent Rollins    ??? Fatty liver    ??? Gallbladder polyp    ??? GERD (gastroesophageal reflux disease) 06/26/03    EGD: duodenitis , dilation of Esophageal ring , DR. [name]   ??? HTN (hypertension) 12/16/2009   ??? HX OTHER MEDICAL     cubital tunnel,left elbow   ??? Prostate cancer ([facility]) 06/02/2017    Low grade by report . 2019 . Dr. Kent Rollins      Current Outpatient Medications   Medication Sig Dispense Refill   ??? atorvastatin (Lipitor) 10 mg tablet Take  by mouth daily.     ??? traMADoL (ULTRAM) 50 mg tablet Take 1 Tab by mouth daily as needed for Pain for up to 20 days. 20 Tab 0   ??? lisinopriL (PRINIVIL, ZESTRIL) 20 mg tablet TAKE ONE TABLET BY MOUTH EVERY MORNING 10 Tab 0   ??? CARTIA XT 300 mg ER capsule TAKE ONE CAPSULE BY MOUTH DAILY 90 Cap 1   ??? meloxicam (MOBIC) 15 mg tablet Take 15 mg by mouth daily as needed for Pain.     ??? diphenhydrAMINE (BENADRYL) 50 mg tablet Take 50 mg by mouth nightly.     ??? diltiazem CD (CARTIA XT) 300 mg ER capsule TAKE ONE CAPSULE BY MOUTH DAILY 90 Cap 3   ??? lansoprazole (PREVACID) 15 mg capsule Take  by mouth Daily (before breakfast).     ??? aspirin delayed-release 81 mg tablet Take  by mouth daily.       No Known Allergies  .  Social History     Tobacco Use   Smoking Status Current Some Day Smoker   ??? Types: Pipe   Smokeless Tobacco Never Used     The patient denies in past 1 yr a history of falls. .  Depression screen .  negative  Review of Systems  Review of Systems - negative except as per HPI    Left > R ankle swell. Ate salt .Left foot tingle at times briefly   Hx GERD    Physical Examination:  Visit Vitals  BP 138/84   Ht 6' 1" (1.854 m)   Wt 224 lb (101.6 kg)   BMI 29.55 kg/m??     Physical Exam    General: Appears in no acute distress  Lung-clear  Heart -reg Bharath Bernstein/o mgr    Abdomen -bs+ soft, nt   Extremities - wrist full rom   Shoulder crepitance  and mild decr rom  2+ PT pulses  No pedal edema  LT sens intact in  ankles .  Feet DF/PT stength intact     Labs: No results found for this or any previous visit (from the past 8 hour(s)).    .   Results for orders placed or performed in visit on 07/29/18   METABOLIC PANEL, COMPREHENSIVE   Result Value Ref Range    Glucose 108 (H) 65 - 99 mg/dL    BUN 16 8 -   27 mg/dL    Creatinine 1.02 0.76 - 1.27 mg/dL    GFR est non-AA 73 >59 mL/min/1.73    GFR est AA 84 >59 mL/min/1.73    BUN/Creatinine ratio 16 10 - 24    Sodium 138 134 - 144 mmol/L    Potassium 4.3 3.5 - 5.2 mmol/L    Chloride 100 96 - 106 mmol/L    CO2 22 20 - 29 mmol/L    Calcium 9.2 8.6 - 10.2 mg/dL    Protein, total 6.9 6.0 - 8.5 g/dL    Albumin 4.5 3.7 - 4.7 g/dL    GLOBULIN, TOTAL 2.4 1.5 - 4.5 g/dL    A-G Ratio 1.9 1.2 - 2.2    Bilirubin, total 0.7 0.0 - 1.2 mg/dL    Alk. phosphatase 71 39 - 117 IU/L    AST (SGOT) 28 0 - 40 IU/L    ALT (SGPT) 36 0 - 44 IU/L   LIPID PANEL   Result Value Ref Range    Cholesterol, total 168 100 - 199 mg/dL    Triglyceride 218 (H) 0 - 149 mg/dL    HDL Cholesterol 30 (L) >39 mg/dL    VLDL, calculated 44 (H) 5 - 40 mg/dL    LDL, calculated 94 0 - 99 mg/dL   CBC WITH AUTOMATED DIFF   Result Value Ref Range    WBC 5.6 3.4 - 10.8 x10E3/uL    RBC 4.97 4.14 - 5.80 x10E6/uL    HGB 14.6 13.0 - 17.7 g/dL    HCT 44.9 37.5 - 51.0 %    MCV 90 79 - 97 fL    MCH 29.4 26.6 - 33.0 pg    MCHC 32.5 31.5 - 35.7 g/dL    RDW 13.2 11.6 - 15.4 %    PLATELET 211 150 - 450 x10E3/uL    NEUTROPHILS 52 Not Estab. %    Lymphocytes 30 Not Estab. %    MONOCYTES 14 Not Estab. %    EOSINOPHILS 3 Not Estab. %    BASOPHILS 1 Not Estab. %    ABS. NEUTROPHILS 2.9 1.4 - 7.0 x10E3/uL     Abs Lymphocytes 1.7 0.7 - 3.1 x10E3/uL    ABS. MONOCYTES 0.8 0.1 - 0.9 x10E3/uL    ABS. EOSINOPHILS 0.2 0.0 - 0.4 x10E3/uL    ABS. BASOPHILS 0.1 0.0 - 0.2 x10E3/uL    IMMATURE GRANULOCYTES 0 Not Estab. %    ABS. IMM. GRANS. 0.0 0.0 - 0.1 x10E3/uL        Assessment/Plan    ICD-10-CM ICD-9-CM    1. Essential hypertension I10 401.9 METABOLIC PANEL, COMPREHENSIVE      COLLECTION VENOUS BLOOD,VENIPUNCTURE      LIPID PANEL      CBC WITH AUTOMATED DIFF      traMADoL (ULTRAM) 50 mg tablet   2. Hypercholesterolemia E78.00 272.0 METABOLIC PANEL, COMPREHENSIVE      COLLECTION VENOUS BLOOD,VENIPUNCTURE      LIPID PANEL      CBC WITH AUTOMATED DIFF      traMADoL (ULTRAM) 50 mg tablet   3. Encounter for long-term (current) use of other medications Z79.899 V58.69 METABOLIC PANEL, COMPREHENSIVE      COLLECTION VENOUS BLOOD,VENIPUNCTURE      LIPID PANEL      CBC WITH AUTOMATED DIFF      traMADoL (ULTRAM) 50 mg tablet   4. Pain in finger of both hands M79.645 729.5 traMADoL (ULTRAM) 50 mg tablet    M79.644  SED RATE (ESR)     5. 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 Check ESR. Discussed Tramadol -erx sent  HTN well controlled. Same rx   Lipids controlled. Same rx   Suspect left foot tingling referred from lumbar spine vs related to described intermittent edema.   RV 3 m cpe or prn sooner   .    Author:  Emberleigh Reily. Clifford Hendrix IV, MD 8:52 AM5/31/2020

## 2018-07-29 NOTE — Progress Notes (Signed)
Left VM results. Advised ADA diet , same statin rx, call back here if significant arthralgia or am stiffness . F/u sooner prn

## 2018-07-30 LAB — CBC WITH AUTOMATED DIFF
ABS. BASOPHILS: 0.1 10*3/uL (ref 0.0–0.2)
ABS. EOSINOPHILS: 0.2 10*3/uL (ref 0.0–0.4)
ABS. IMM. GRANS.: 0 10*3/uL (ref 0.0–0.1)
ABS. MONOCYTES: 0.8 10*3/uL (ref 0.1–0.9)
ABS. NEUTROPHILS: 2.9 10*3/uL (ref 1.4–7.0)
Abs Lymphocytes: 1.7 10*3/uL (ref 0.7–3.1)
BASOPHILS: 1 %
EOSINOPHILS: 3 %
HCT: 44.9 % (ref 37.5–51.0)
HGB: 14.6 g/dL (ref 13.0–17.7)
IMMATURE GRANULOCYTES: 0 %
Lymphocytes: 30 %
MCH: 29.4 pg (ref 26.6–33.0)
MCHC: 32.5 g/dL (ref 31.5–35.7)
MCV: 90 fL (ref 79–97)
MONOCYTES: 14 %
NEUTROPHILS: 52 %
PLATELET: 211 10*3/uL (ref 150–450)
RBC: 4.97 x10E6/uL (ref 4.14–5.80)
RDW: 13.2 % (ref 11.6–15.4)
WBC: 5.6 10*3/uL (ref 3.4–10.8)

## 2018-07-30 LAB — METABOLIC PANEL, COMPREHENSIVE
A-G Ratio: 1.9 (ref 1.2–2.2)
ALT (SGPT): 36 IU/L (ref 0–44)
AST (SGOT): 28 IU/L (ref 0–40)
Albumin: 4.5 g/dL (ref 3.7–4.7)
Alk. phosphatase: 71 IU/L (ref 39–117)
BUN/Creatinine ratio: 16 (ref 10–24)
BUN: 16 mg/dL (ref 8–27)
Bilirubin, total: 0.7 mg/dL (ref 0.0–1.2)
CO2: 22 mmol/L (ref 20–29)
Calcium: 9.2 mg/dL (ref 8.6–10.2)
Chloride: 100 mmol/L (ref 96–106)
Creatinine: 1.02 mg/dL (ref 0.76–1.27)
GFR est AA: 84 mL/min/{1.73_m2} (ref >59)
GFR est non-AA: 73 mL/min/{1.73_m2} (ref >59)
GLOBULIN, TOTAL: 2.4 g/dL (ref 1.5–4.5)
Glucose: 108 mg/dL — ABNORMAL HIGH (ref 65–99)
Potassium: 4.3 mmol/L (ref 3.5–5.2)
Protein, total: 6.9 g/dL (ref 6.0–8.5)
Sodium: 138 mmol/L (ref 134–144)

## 2018-07-30 LAB — LIPID PANEL
Cholesterol, Total: 168 mg/dL (ref 100–199)
Cholesterol, total: 168 mg/dL (ref 100–199)
HDL Cholesterol: 30 mg/dL — ABNORMAL LOW (ref >39)
HDL: 30 mg/dL — ABNORMAL LOW (ref >39)
LDL Calculated: 94 mg/dL (ref 0–99)
LDL, calculated: 94 mg/dL (ref 0–99)
Triglyceride: 218 mg/dL — ABNORMAL HIGH (ref 0–149)
Triglycerides: 218 mg/dL — ABNORMAL HIGH (ref 0–149)
VLDL Cholesterol Calculated: 44 mg/dL — ABNORMAL HIGH (ref 5–40)
VLDL, calculated: 44 mg/dL — ABNORMAL HIGH (ref 5–40)

## 2018-07-30 LAB — COMPREHENSIVE METABOLIC PANEL
ALT: 36 IU/L (ref 0–44)
AST: 28 IU/L (ref 0–40)
Albumin/Globulin Ratio: 1.9 NA (ref 1.2–2.2)
Albumin: 4.5 g/dL (ref 3.7–4.7)
Alkaline Phosphatase: 71 IU/L (ref 39–117)
BUN: 16 mg/dL (ref 8–27)
Bun/Cre Ratio: 16 NA (ref 10–24)
CO2: 22 mmol/L (ref 20–29)
Calcium: 9.2 mg/dL (ref 8.6–10.2)
Chloride: 100 mmol/L (ref 96–106)
Creatinine: 1.02 mg/dL (ref 0.76–1.27)
EGFR IF NonAfrican American: 73 mL/min/{1.73_m2} (ref >59)
GFR African American: 84 mL/min/{1.73_m2} (ref >59)
Globulin, Total: 2.4 g/dL (ref 1.5–4.5)
Glucose: 108 mg/dL — ABNORMAL HIGH (ref 65–99)
Potassium: 4.3 mmol/L (ref 3.5–5.2)
Sodium: 138 mmol/L (ref 134–144)
Total Bilirubin: 0.7 mg/dL (ref 0.0–1.2)
Total Protein: 6.9 g/dL (ref 6.0–8.5)

## 2018-07-30 LAB — CBC WITH AUTO DIFFERENTIAL
Basophils %: 1 %
Basophils Absolute: 0.1 10*3/uL (ref 0.0–0.2)
Eosinophils %: 3 %
Eosinophils Absolute: 0.2 10*3/uL (ref 0.0–0.4)
Granulocyte Absolute Count: 0 10*3/uL (ref 0.0–0.1)
Hematocrit: 44.9 % (ref 37.5–51.0)
Hemoglobin: 14.6 g/dL (ref 13.0–17.7)
Immature Granulocytes: 0 %
Lymphocytes %: 30 %
Lymphocytes Absolute: 1.7 10*3/uL (ref 0.7–3.1)
MCH: 29.4 pg (ref 26.6–33.0)
MCHC: 32.5 g/dL (ref 31.5–35.7)
MCV: 90 fL (ref 79–97)
Monocytes %: 14 %
Monocytes Absolute: 0.8 10*3/uL (ref 0.1–0.9)
Neutrophils %: 52 %
Neutrophils Absolute: 2.9 10*3/uL (ref 1.4–7.0)
Platelets: 211 10*3/uL (ref 150–450)
RBC: 4.97 x10E6/uL (ref 4.14–5.80)
RDW: 13.2 % (ref 11.6–15.4)
WBC: 5.6 10*3/uL (ref 3.4–10.8)

## 2018-08-02 LAB — SPECIMEN STATUS REPORT

## 2018-08-09 MED ORDER — LISINOPRIL 20 MG TAB
20 mg | ORAL_TABLET | ORAL | 0 refills | Status: DC
Start: 2018-08-09 — End: 2018-08-12

## 2018-08-12 MED ORDER — LISINOPRIL 20 MG TAB
20 mg | ORAL_TABLET | ORAL | 3 refills | Status: DC
Start: 2018-08-12 — End: 2019-08-14

## 2018-08-23 ENCOUNTER — Encounter

## 2018-08-23 MED ORDER — TRAMADOL 50 MG TAB
50 mg | ORAL_TABLET | ORAL | 0 refills | Status: AC
Start: 2018-08-23 — End: 2018-09-22

## 2018-09-27 MED ORDER — CARTIA XT 300 MG CAPSULE,EXTENDED RELEASE
300 mg | ORAL_CAPSULE | ORAL | 3 refills | Status: DC
Start: 2018-09-27 — End: 2019-09-28

## 2018-11-01 ENCOUNTER — Ambulatory Visit: Admit: 2018-11-01 | Attending: Internal Medicine | Primary: Internal Medicine

## 2018-11-01 ENCOUNTER — Ambulatory Visit: Attending: Internal Medicine | Primary: Internal Medicine

## 2018-11-01 DIAGNOSIS — I10 Essential (primary) hypertension: Secondary | ICD-10-CM

## 2018-11-01 LAB — AMB POC COMPLETE CBC,AUTOMATED ENTER
ABS. LYMPHS (POC): 2 10*3/uL (ref 1.2–3.4)
HCT (POC): 44.4 % (ref 35.0–60.0)
HGB (POC): 14.9 g/dL (ref 11–18)
Hematocrit, POC: 44.4 % (ref 35.0–60.0)
Hemoglobin, POC: 14.9 g/dL (ref 11–18)
LYMPHOCYTES (POC): 29 % (ref 20.5–51.1)
Lymphocyte %: 29 % (ref 20.5–51.1)
Lymphs Abs: 2 10*3/uL (ref 1.2–3.4)
MCV (POC): 86.4 fL (ref 80.0–99.9)
MCV: 86.4 fL (ref 80.0–99.9)
PLATELET (POC): 203 10*3/uL (ref 150–450)
Platelet Count, POC: 203 10*3/uL (ref 150–450)
RBC (POC): 5.14 M/uL (ref 4.00–6.00)
RBC, POC: 5.14 M/uL (ref 4.00–6.00)
WBC (POC): 8.9 10*3/uL (ref 4.5–10.5)
WBC, POC: 8.9 10*3/uL (ref 4.5–10.5)

## 2018-11-01 NOTE — Progress Notes (Signed)
This is the Subsequent Medicare Annual Wellness Exam, performed 12 months or more after the Initial AWV or the last Subsequent AWV    I have reviewed the patient's medical history in detail and updated the computerized patient record.     History     Patient Active Problem List   Diagnosis Code   ??? HTN (hypertension) I10   ??? Elevated lipids E78.5   ??? Fatty liver K76.0   ??? Elevated PSA R97.20   ??? Prostate cancer ([facility]) C61   ??? GERD (gastroesophageal reflux disease) K21.9     Past Medical History:   Diagnosis Date   ??? Arrhythmia     Hx PAT. dr. Canary BrimSperry evaluated.    ??? Depression    ??? Elevated lipids 12/16/2009   ??? Elevated PSA     Dr. Steele BergKent Rollins    ??? Fatty liver    ??? Gallbladder polyp    ??? GERD (gastroesophageal reflux disease) 06/26/03    EGD: duodenitis , dilation of Esophageal ring , DR. [name]   ??? HTN (hypertension) 12/16/2009   ??? HX OTHER MEDICAL     cubital tunnel,left elbow   ??? Prostate cancer ([facility]) 06/02/2017    Low grade by report . 2019 . Dr. Steele BergKent Rollins       Past Surgical History:   Procedure Laterality Date   ??? ENDOSCOPY, COLON, DIAGNOSTIC  06/26/03    diverticulosis, polypectomy, f/u dr. Ardine Engiehl    ??? HX GI      right inguinal hernia repair x3   ??? HX OTHER SURGICAL      removal pilonidal cyst   ??? HX UROLOGICAL      right orchiectomy for benign growth   ??? HX UROLOGICAL  05/25/2017    Prostate biopsy by [name] MD     Current Outpatient Medications   Medication Sig Dispense Refill   ??? psyllium (METAMUCIL) powd Take  by mouth daily.     ??? Cartia XT 300 mg capsule TAKE ONE CAPSULE BY MOUTH DAILY 90 Cap 3   ??? lisinopriL (PRINIVIL, ZESTRIL) 20 mg tablet TAKE ONE TABLET BY MOUTH EVERY MORNING 90 Tab 3   ??? atorvastatin (Lipitor) 10 mg tablet Take  by mouth daily.     ??? diphenhydrAMINE (BENADRYL) 50 mg tablet Take 50 mg by mouth nightly.     ??? diltiazem CD (CARTIA XT) 300 mg ER capsule TAKE ONE CAPSULE BY MOUTH DAILY 90 Cap 3   ??? lansoprazole (PREVACID) 15 mg capsule Take  by mouth Daily (before breakfast).     ???  aspirin delayed-release 81 mg tablet Take  by mouth daily.     ??? meloxicam (MOBIC) 15 mg tablet Take 15 mg by mouth daily as needed for Pain.       No Known Allergies    Family History   Problem Relation Age of Onset   ??? Cancer Sister         lung   ??? Cancer Sister         lung   ??? Lung Disease Father         emphysema   ??? Heart Disease Brother 52        sudden death  in sleep     Social History     Tobacco Use   ??? Smoking status: Current Some Day Smoker     Types: Pipe   ??? Smokeless tobacco: Never Used   Substance Use Topics   ??? Alcohol use: Yes  Comment: 1 drink/month       Depression Risk Factor Screening:     3 most recent PHQ Screens 11/01/2018   Little interest or pleasure in doing things Not at all   Feeling down, depressed, irritable, or hopeless Not at all   Total Score PHQ 2 0       Alcohol Risk Factor Screening (MALE > 65):   Do you average more 1 drink per night or more than 7 drinks a week: No    In the past three months have you have had more than 4 drinks containing alcohol on one occasion: No      Functional Ability and Level of Safety:   Hearing: The patient wears hearing aids.     Activities of Daily Living:  The home contains: grab bars  Patient does total self care     Ambulation: with no difficulty     Fall Risk:  Fall Risk Assessment, last 12 mths 11/01/2018   Able to walk? Yes   Fall in past 12 months? No   Fall with injury? -   Number of falls in past 12 months -   Fall Risk Score -     Abuse Screen:  Patient is not abused       Cognitive Screening   Has your family/caregiver stated any concerns about your memory: yes - mild difficulty for names     Cognitive Screening: Normal - Clock Drawing Test    Patient Care Team   Patient Care Team:  Darlina Rumpf, MD as PCP - General (Internal Medicine)    Assessment/Plan   Education and counseling provided:  Are appropriate based on today's review and evaluation    Diagnoses and all orders for this visit:    1. Essential  hypertension        Health Maintenance Due   Topic Date Due   ??? DTaP/Tdap/Td series (1 - Tdap) 04/16/1967   ??? GLAUCOMA SCREENING Q2Y  05/08/2015   ??? Medicare Yearly Exam  06/01/2018   ??? Flu Vaccine (1) 11/01/2018

## 2018-11-01 NOTE — ACP (Advance Care Planning) (Signed)
Advance Care Planning       Advance Care Planning (ACP) Physician/NP/PA (Provider) Conversation        Date of ACP Conversation: 11/01/2018    Conversation Conducted with:   [name]    Current Designated Health Care Decision Soto: wife Jeffrey Soto  (If there is a [place] named in the "Donnel[place]" box in the ACP activity, but it is not visible above, be sure to open that field and then select the health care decision Soto relationship (ie "primary") in the blank space to the right of the name.)    Note: Assess and validate information in current ACP documents, as indicated.     If no Authorized Media planner has previously been identified, then patient chooses [place]:  "Who would you like to name as your primary health care decision-Soto?"    Name: Jeffrey Soto  Relationship: wife* Phone number: [phone]  "Can this person be reached easily?" yes   "Who would you like to name as your back-up decision Soto?"   Name: Jeffrey Soto  Relationship: dtr* Phone [phone]  "Can this person be reached easily?" yes    Note: If the relationship of these Decision-Makers to the patient does NOT follow your state's Next of Kin hierarchy, recommend that patient complete ACP document that meets state-specific requirements to allow them to act on the patient's behalf when appropriate.      Care Preferences:    Hospitalization:  "If your health worsens and it becomes clear that your chance of recovery is unlikely, what would your preference be regarding hospitalization?"  Yes hospitalization       Ventilation:  "If you were in your present state of health and suddenly became very ill and were unable to breathe on your own, what would your preference be about the use of a ventilator (breathing machine) if it was available to you?"    Yes     "If your health worsens and it becomes clear that your  chance of recovery is unlikely, what would your preference be about the use of a ventilator (breathing machine) if it was available to you?"   No ventilator       Resuscitation:  "CPR works best to restart the heart when there is a sudden event, like a heart attack, in someone who is otherwise healthy. Unfortunately, CPR does not typically restart the heart for people who have serious health conditions or who are very sick."    "In the event your heart stopped as a result of an underlying serious health condition, would you want attempts to be made to restart your heart (answer "yes" for attempt to resuscitate) or would you prefer a natural death (answer "no" for do not attempt to resuscitate)?"   Yes CPR    NOTE: If the patient has a valid advance directive AND provides care preference(s) that are inconsistent with that prior directive, advise the patient to consider either: creating a new advance directive that complies with state-specific requirements; or, if that is not possible, orally revoking that prior directive in accordance with state-specific requirements, which must be documented in the EHR.    Conversation Outcomes / Follow-Up Plan:   His ACP wishes were reviewed       Length of Voluntary ACP Conversation in minutes:  16 minutes       Dian Minahan. Anibal Henderson, MD

## 2018-11-01 NOTE — Progress Notes (Signed)
Jeffrey NonesJames A Soto is a 73 y.o. Jeffrey Soto/m CC: chest pain     HPI   GERD eval'd by [name] MD . Leanora Soto Prevacid 15 mg daily  Non exertional Chest ache at times. Neg Cardiac [name]/u few yrs ago .   Chronic cough neg chest CTA and nl Spirometry this year eval'd by Pulm   Past Medical History:   Diagnosis Date   ??? Arrhythmia     Hx PAT. dr. Canary BrimSperry evaluated.    ??? Depression    ??? Elevated lipids 12/16/2009   ??? Elevated PSA     Dr. Steele BergKent Rollins    ??? Fatty liver    ??? Gallbladder polyp    ??? GERD (gastroesophageal reflux disease) 06/26/03    EGD: duodenitis , dilation of Esophageal ring , DR. [name]   ??? HTN (hypertension) 12/16/2009   ??? HX OTHER MEDICAL     cubital tunnel,left elbow   ??? Prostate cancer (HCC) 06/02/2017    Low grade by report . 2019 . Dr. Steele BergKent Rollins      Past Surgical History:   Procedure Laterality Date   ??? ENDOSCOPY, COLON, DIAGNOSTIC  06/26/03    diverticulosis, polypectomy, f/u dr. Ardine Engiehl    ??? HX GI      right inguinal hernia repair x3   ??? HX OTHER SURGICAL      removal pilonidal cyst   ??? HX UROLOGICAL      right orchiectomy for benign growth   ??? HX UROLOGICAL  05/25/2017    Prostate biopsy by [name] MD     Current Outpatient Medications   Medication Sig Dispense Refill   ??? psyllium (METAMUCIL) powd Take  by mouth daily.     ??? Cartia XT 300 mg capsule TAKE ONE CAPSULE BY MOUTH DAILY 90 Cap 3   ??? lisinopriL (PRINIVIL, ZESTRIL) 20 mg tablet TAKE ONE TABLET BY MOUTH EVERY MORNING 90 Tab 3   ??? atorvastatin (Lipitor) 10 mg tablet Take  by mouth daily.     ??? diphenhydrAMINE (BENADRYL) 50 mg tablet Take 50 mg by mouth nightly.     ??? diltiazem CD (CARTIA XT) 300 mg ER capsule TAKE ONE CAPSULE BY MOUTH DAILY 90 Cap 3   ??? lansoprazole (PREVACID) 15 mg capsule Take  by mouth Daily (before breakfast).     ??? aspirin delayed-release 81 mg tablet Take  by mouth daily.     ??? meloxicam (MOBIC) 15 mg tablet Take 15 mg by mouth daily as needed for Pain.       No Known Allergies  Family History   Problem Relation Age of Onset   ??? Cancer  Sister         lung   ??? Cancer Sister         lung   ??? Lung Disease Father         emphysema   ??? Heart Disease Brother 52        sudden death  in sleep     Social History     Tobacco Use   Smoking Status Former Smoker   Smokeless Tobacco Never Used       Review of Systems  Denies dyspnea.  Cardiac : see above HPI  GI: Bowels move regularly without hematochezia.. hx of  Prostate CA nocturia 0-4 times. At times hesitancy. 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     Physical Examination:  Visit Vitals  BP 162/88   Temp 97.5 ??F (36.4 ??C)   Ht 6\' 1"  (1.854 m)   Wt 220 lb (99.8 kg)   SpO2 98%   BMI 29.03 kg/m??     Physical Exam  General:   Appears in no acute distress.   HEENT:   Negative    Breast exam no mass or ax LN felt   Chest wall nt                    Lungs:   Clear        Heart:  Regular without murmur, gallop or rub       Abdomen:   Benign exam without organomegaly or mass palpable    GU: nl genitalia Ran Tullis/o hernia             Rectal:  per Urology    Extremities: Wrists : mild decreased rom  no edema   Neurologic: Grossly nonfocal    EKG: NSR. Voltage criteria for LVH Berle Fitz/o ST, T changes, may be normal  .    Recent Results (from the past 8 hour(s))   AMB POC COMPLETE CBC,AUTOMATED ENTER    Collection Time: 11/01/18  3:03 PM   Result Value Ref Range    WBC (POC) 8.9 4.5 - 10.5 K/uL    LYMPHOCYTES (POC) 29.0 20.5 - 51.1 %    MONOCYTES (POC)      GRANULOCYTES (POC)      ABS. LYMPHS (POC) 2.0 1.2 - 3.4 K/uL    ABS. MONOS (POC)      ABS. GRANS (POC)      RBC (POC) 5.14 4.00 - 6.00 M/uL    HGB (POC) 14.9 11 - 18 g/dL    HCT (POC) 44.4 35.0 - 60.0 %    MCV (POC) 86.4 80.0 - 99.9 fL    MCH (POC)      MCHC (POC)      RDW (POC)      PLATELET (POC) 203 150 - 450 K/uL    MPV (POC)                     Assessment/Plan    ICD-10-CM ICD-9-CM    1. Essential hypertension  I10 401.9 COLLECTION VENOUS BLOOD,VENIPUNCTURE       AMB POC COMPLETE CBC,AUTOMATED ENTER      LIPID PANEL      METABOLIC PANEL, COMPREHENSIVE      AMB POC EKG ROUTINE Danika Kluender/ 12 LEADS, INTER & REP   2. Medicare annual wellness visit, subsequent  Z00.00 V70.0 COLLECTION VENOUS BLOOD,VENIPUNCTURE      AMB POC COMPLETE CBC,AUTOMATED ENTER      LIPID PANEL      METABOLIC PANEL, COMPREHENSIVE      AMB POC EKG ROUTINE Dory Demont/ 12 LEADS, INTER & REP   3. Advanced directives, counseling/discussion  Z71.89 V65.49 ADVANCE CARE PLANNING FIRST 30 MINS      COLLECTION VENOUS BLOOD,VENIPUNCTURE      AMB POC COMPLETE CBC,AUTOMATED ENTER      LIPID PANEL      METABOLIC PANEL, COMPREHENSIVE      AMB POC EKG ROUTINE Ruger Saxer/ 12 LEADS, INTER & REP   4. Encounter for long-term (current) use of other medications  Z79.899 V58.69 COLLECTION VENOUS BLOOD,VENIPUNCTURE      AMB POC COMPLETE CBC,AUTOMATED ENTER      LIPID PANEL  METABOLIC PANEL, COMPREHENSIVE      AMB POC EKG ROUTINE Reese Stockman/ 12 LEADS, INTER & REP   5. Hypercholesterolemia  E78.00 272.0 COLLECTION VENOUS BLOOD,VENIPUNCTURE      AMB POC COMPLETE CBC,AUTOMATED ENTER      LIPID PANEL      METABOLIC PANEL, COMPREHENSIVE      AMB POC EKG ROUTINE Geovana Gebel/ 12 LEADS, INTER & REP   6. Cough  R05 786.2 COLLECTION VENOUS BLOOD,VENIPUNCTURE      AMB POC COMPLETE CBC,AUTOMATED ENTER      LIPID PANEL      METABOLIC PANEL, COMPREHENSIVE      AMB POC EKG ROUTINE Kaslyn [place]/ 12 LEADS, INTER & REP   7. Gastroesophageal reflux disease, esophagitis presence not specified  K21.9 530.81 COLLECTION VENOUS BLOOD,VENIPUNCTURE      AMB POC COMPLETE CBC,AUTOMATED ENTER      LIPID PANEL      METABOLIC PANEL, COMPREHENSIVE      AMB POC EKG ROUTINE Xin Klawitter/ 12 LEADS, INTER & REP   8. Prostate cancer (HCC)  C61 185 COLLECTION VENOUS BLOOD,VENIPUNCTURE      AMB POC COMPLETE CBC,AUTOMATED ENTER      LIPID PANEL      METABOLIC PANEL, COMPREHENSIVE      AMB POC EKG ROUTINE Lawrence Roldan/ 12 LEADS, INTER & REP   9. Need for Tdap vaccination  Z23 V06.1    10. Abrasion  T14.8XXA 919.0 TETANUS, DIPHTHERIA  TOXOIDS AND ACELLULAR PERTUSSIS VACCINE (TDAP), IN INDIVIDS. >=7, IM      Hx white coat + HTN . Check bp at home ; call me results     Await lipids     Cough and chest pain may be 2/2 GERD   incr  Prevacid to 30 mg daily     Abrasion in past. Tetanus needs update.   TDAP given .     Prostate ca . F/u [facility]     RV 1 y  Or prn sooner     Author:  Ysidro Evert, MD 2:33 PM9/03/2018

## 2018-11-01 NOTE — Progress Notes (Signed)
Left VM results. Advised wt loss via ADA diet and exercise

## 2018-11-01 NOTE — Patient Instructions (Addendum)
Medicare Wellness Visit, Male    The best way to live healthy is to have a lifestyle where you eat a well-balanced diet, exercise regularly, limit alcohol use, and quit all forms of tobacco/nicotine, if applicable.     Regular preventive services are another way to keep healthy. Preventive services (vaccines, screening tests, monitoring & exams) can help personalize your care plan, which helps you manage your own care. Screening tests can find health problems at the earliest stages, when they are easiest to treat.   [facility] follows the current, evidence-based guidelines published by the [place] [employer] (USPSTF) when recommending preventive services for our patients. Because we follow these guidelines, sometimes recommendations change over time as research supports it. (For example, a prostate screening blood test is no longer routinely recommended for men with no symptoms).  Of course, you and your doctor may decide to screen more often for some diseases, based on your risk and co-morbidities (chronic disease you are already diagnosed with).     Preventive services for you include:  - Medicare offers their members a free annual wellness visit, which is time for you and your primary care provider to discuss and plan for your preventive service needs. Take advantage of this benefit every year!  -All adults over age 57 should receive the recommended pneumonia vaccines. Current USPSTF guidelines recommend a series of two vaccines for the best pneumonia protection.   -All adults should have a flu vaccine yearly and tetanus vaccine every 10 years.  -All adults age 70 and older should receive the shingles vaccines (series of two vaccines).       -All adults age 101-70 who are overweight should have a diabetes screening test once every three years.   -Other screening tests & preventive services for persons with diabetes  include: an eye exam to screen for diabetic retinopathy, a kidney function test, a foot exam, and stricter control over your cholesterol.   -Cardiovascular screening for adults with routine risk involves an electrocardiogram (ECG) at intervals determined by the provider.   -Colorectal cancer screening should be done for adults age 53-75 with no increased risk factors for colorectal cancer.  There are a number of acceptable methods of screening for this type of cancer. Each test has its own benefits and drawbacks. Discuss with your provider what is most appropriate for you during your annual wellness visit. The different tests include: colonoscopy (considered the best screening method), a fecal occult blood test, a fecal DNA test, and sigmoidoscopy.  -All adults born between [place] should be screened once for Hepatitis C.  -An Abdominal Aortic Aneurysm (AAA) Screening is recommended for men age 70-75 who has ever smoked in their lifetime.     Here is a list of your current Health Maintenance items (your personalized list of preventive services) with a due date:  Health Maintenance Due   Topic Date Due   ??? DTaP/Tdap/Td  (1 - Tdap) 04/16/1967   ??? Glaucoma Screening   05/08/2015   ??? Annual Well Visit  06/01/2018   ??? Yearly Flu Vaccine (1) 11/01/2018     In October 2020 , take Flu shot   Take 2 Prevacid 15 mg pills together daily and call me in 1 week with how your chest and cough are doing   Gradually lose weight -continue to exercise and avoid sweets, bread, pasta , rice, and potatoes.  Return in 1 year here or sooner if needed

## 2018-11-01 NOTE — ACP (Advance Care Planning) (Signed)
Advance Care Planning       Advance Care Planning (ACP) Physician/NP/PA (Provider) Conversation        Date of ACP Conversation: 11/01/2018    Conversation Conducted with:   Patient with Jeffrey Soto:Jeffrey Soto    Current Designated Health Care Decision Soto: wife Santiago Stenzel  (If there is a [place] named in the "[place]" box in the ACP activity, but it is not visible above, be sure to open that field and then select the health care decision Soto relationship (ie "primary") in the blank space to the right of the name.)    Note: Assess and validate information in current ACP documents, as indicated.     If no Authorized Media planner has previously been identified, then patient chooses [place]:  "Who would you like to name as your primary health care decision-Soto?"    Name: Jovontae Banko  Relationship: wife* Phone number: [phone]  "Can this person be reached easily?" yes   "Who would you like to name as your back-up decision Soto?"   Name: Jeralene Huff  Relationship: dtr* Phone number:[phone]  "Can this person be reached easily?" yes    Note: If the relationship of these Decision-Makers to the patient does NOT follow your state's Next of Kin hierarchy, recommend that patient complete ACP document that meets state-specific requirements to allow them to act on the patient's behalf when appropriate.      Care Preferences:    Hospitalization:  "If your health worsens and it becomes clear that your chance of recovery is unlikely, what would your preference be regarding hospitalization?"  Yes hospitalization       Ventilation:  "If you were in your present state of health and suddenly became very ill and were unable to breathe on your own, what would your preference be about the use of a ventilator (breathing machine) if it was available to you?"    Yes      "If your health worsens and it becomes clear that your chance of recovery is unlikely, what would your preference be about the use of a ventilator (breathing machine) if it was available to you?"   No ventilator       Resuscitation:  "CPR works best to restart the heart when there is a sudden event, like a heart attack, in someone who is otherwise healthy. Unfortunately, CPR does not typically restart the heart for people who have serious health conditions or who are very sick."    "In the event your heart stopped as a result of an underlying serious health condition, would you want attempts to be made to restart your heart (answer "yes" for attempt to resuscitate) or would you prefer a natural death (answer "no" for do not attempt to resuscitate)?"   Yes CPR    NOTE: If the patient has a valid advance directive AND provides care preference(s) that are inconsistent with that prior directive, advise the patient to consider either: creating a new advance directive that complies with state-specific requirements; or, if that is not possible, orally revoking that prior directive in accordance with state-specific requirements, which must be documented in the EHR.    Conversation Outcomes / Follow-Up Plan:   His ACP wishes were reviewed       Length of Voluntary ACP Conversation in minutes:  16 minutes       Dorita Rowlands. Anibal Henderson, MD

## 2018-11-01 NOTE — Progress Notes (Signed)
This is the Subsequent Medicare Annual Wellness Exam, performed 12 months or more after the Initial AWV or the last Subsequent AWV    I have reviewed the patient's medical history in detail and updated the computerized patient record.     History     Patient Active Problem List   Diagnosis Code   ??? HTN (hypertension) I10   ??? Elevated lipids E78.5   ??? Fatty liver K76.0   ??? Elevated PSA R97.20   ??? Prostate cancer ([facility]) C61   ??? GERD (gastroesophageal reflux disease) K21.9     Past Medical History:   Diagnosis Date   ??? Arrhythmia     Hx PAT. dr. Ezequiel Ganser evaluated.    ??? Depression    ??? Elevated lipids 12/16/2009   ??? Elevated PSA     Dr. Ceasar Mons    ??? Fatty liver    ??? Gallbladder polyp    ??? GERD (gastroesophageal reflux disease) 06/26/03    EGD: duodenitis , dilation of Esophageal ring , DR. [name]   ??? HTN (hypertension) 12/16/2009   ??? HX OTHER MEDICAL     cubital tunnel,left elbow   ??? Prostate cancer ([facility]) 06/02/2017    Low grade by report . 2019 . Dr. Ceasar Mons       Past Surgical History:   Procedure Laterality Date   ??? ENDOSCOPY, COLON, DIAGNOSTIC  06/26/03    diverticulosis, polypectomy, f/u dr. Gerald Dexter    ??? HX GI      right inguinal hernia repair x3   ??? HX OTHER SURGICAL      removal pilonidal cyst   ??? HX UROLOGICAL      right orchiectomy for benign growth   ??? HX UROLOGICAL  05/25/2017    Prostate biopsy by [name] MD     Current Outpatient Medications   Medication Sig Dispense Refill   ??? psyllium (METAMUCIL) powd Take  by mouth daily.     ??? Cartia XT 300 mg capsule TAKE ONE CAPSULE BY MOUTH DAILY 90 Cap 3   ??? lisinopriL (PRINIVIL, ZESTRIL) 20 mg tablet TAKE ONE TABLET BY MOUTH EVERY MORNING 90 Tab 3   ??? atorvastatin (Lipitor) 10 mg tablet Take  by mouth daily.     ??? diphenhydrAMINE (BENADRYL) 50 mg tablet Take 50 mg by mouth nightly.     ??? diltiazem CD (CARTIA XT) 300 mg ER capsule TAKE ONE CAPSULE BY MOUTH DAILY 90 Cap 3   ??? lansoprazole (PREVACID) 15 mg capsule Take  by mouth Daily (before breakfast).      ??? aspirin delayed-release 81 mg tablet Take  by mouth daily.     ??? meloxicam (MOBIC) 15 mg tablet Take 15 mg by mouth daily as needed for Pain.       No Known Allergies    Family History   Problem Relation Age of Onset   ??? Cancer Sister         lung   ??? Cancer Sister         lung   ??? Lung Disease Father         emphysema   ??? Heart Disease Brother 75        sudden death  in sleep     Social History     Tobacco Use   ??? Smoking status: Current Some Day Smoker     Types: Pipe   ??? Smokeless tobacco: Never Used   Substance Use Topics   ??? Alcohol use: Yes  Comment: 1 drink/month       Depression Risk Factor Screening:     3 most recent PHQ Screens 11/01/2018   Little interest or pleasure in doing things Not at all   Feeling down, depressed, irritable, or hopeless Not at all   Total Score PHQ 2 0       Alcohol Risk Factor Screening (MALE > 65):   Do you average more 1 drink per night or more than 7 drinks a week: No    In the past three months have you have had more than 4 drinks containing alcohol on one occasion: No      Functional Ability and Level of Safety:   Hearing: The patient wears hearing aids.     Activities of Daily Living:  The home contains: grab bars  Patient does total self care     Ambulation: with no difficulty     Fall Risk:  Fall Risk Assessment, last 12 mths 11/01/2018   Able to walk? Yes   Fall in past 12 months? No   Fall with injury? -   Number of falls in past 12 months -   Fall Risk Score -     Abuse Screen:  Patient is not abused       Cognitive Screening   Has your family/caregiver stated any concerns about your memory: yes - mild difficulty for names     Cognitive Screening: Normal - Clock Drawing Test    Patient Care Team   Patient Care Team:  Ysidro EvertHendrix, Quoc Tome. Clifford IV, MD as PCP - General (Internal Medicine)    Assessment/Plan   Education and counseling provided:  Are appropriate based on today's review and evaluation    Diagnoses and all orders for this visit:    1. Essential hypertension         Health Maintenance Due   Topic Date Due   ??? DTaP/Tdap/Td series (1 - Tdap) 04/16/1967   ??? GLAUCOMA SCREENING Q2Y  05/08/2015   ??? Medicare Yearly Exam  06/01/2018   ??? Flu Vaccine (1) 11/01/2018

## 2018-11-01 NOTE — Progress Notes (Signed)
Jeffrey Soto is a 73 y.o. Jeffrey Soto/m CC: chest pain     HPI   GERD eval'd by Jeffrey Johns MD . Jeffrey Soto Prevacid 15 mg daily  Non exertional Chest ache at times. Neg Cardiac Jeffrey Soto/u few yrs ago .   Chronic cough neg chest CTA and nl Spirometry this year eval'd by Pulm   Past Medical History:   Diagnosis Date   ??? Arrhythmia     Hx PAT. dr. Ezequiel Soto evaluated.    ??? Depression    ??? Elevated lipids 12/16/2009   ??? Elevated PSA     Dr. Ceasar Soto    ??? Fatty liver    ??? Gallbladder polyp    ??? GERD (gastroesophageal reflux disease) 06/26/03    EGD: duodenitis , dilation of Esophageal ring , Jeffrey Soto   ??? HTN (hypertension) 12/16/2009   ??? HX OTHER MEDICAL     cubital tunnel,left elbow   ??? Prostate cancer (Jeffrey Soto) 06/02/2017    Low grade by report . 2019 . Dr. Ceasar Soto      Past Surgical History:   Procedure Laterality Date   ??? ENDOSCOPY, COLON, DIAGNOSTIC  06/26/03    diverticulosis, polypectomy, f/u dr. Gerald Soto    ??? HX GI      right inguinal hernia repair x3   ??? HX OTHER SURGICAL      removal pilonidal cyst   ??? HX UROLOGICAL      right orchiectomy for benign growth   ??? HX UROLOGICAL  05/25/2017    Prostate biopsy by [name] MD     Current Outpatient Medications   Medication Sig Dispense Refill   ??? psyllium (METAMUCIL) powd Take  by mouth daily.     ??? Cartia XT 300 mg capsule TAKE ONE CAPSULE BY MOUTH DAILY 90 Cap 3   ??? lisinopriL (PRINIVIL, ZESTRIL) 20 mg tablet TAKE ONE TABLET BY MOUTH EVERY MORNING 90 Tab 3   ??? atorvastatin (Lipitor) 10 mg tablet Take  by mouth daily.     ??? diphenhydrAMINE (BENADRYL) 50 mg tablet Take 50 mg by mouth nightly.     ??? diltiazem CD (CARTIA XT) 300 mg ER capsule TAKE ONE CAPSULE BY MOUTH DAILY 90 Cap 3   ??? lansoprazole (PREVACID) 15 mg capsule Take  by mouth Daily (before breakfast).     ??? aspirin delayed-release 81 mg tablet Take  by mouth daily.     ??? meloxicam (MOBIC) 15 mg tablet Take 15 mg by mouth daily as needed for Pain.       No Known Allergies  Family History   Problem Relation Age of Onset    ??? Cancer Sister         lung   ??? Cancer Sister         lung   ??? Lung Disease Father         emphysema   ??? Heart Disease Brother 24        sudden death  in sleep     Social History     Tobacco Use   Smoking Status Former Smoker   Smokeless Tobacco Never Used       Review of Systems  Denies dyspnea.  Cardiac : see above HPI  GI: Bowels move regularly without hematochezia.. hx of  Prostate CA nocturia 0-4 times. At times hesitancy. 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     Physical Examination:  Visit Vitals  BP 162/88   Temp 97.5 ??F (36.4 ??C)   Ht 6\' 1"  (1.854 m)   Wt 220 lb (99.8 kg)   SpO2 98%   BMI 29.03 kg/m??     Physical Exam  General:   Appears in no acute distress.   HEENT:   Negative    Breast exam no mass or ax LN felt   Chest wall nt                    Lungs:   Clear        Heart:  Regular without murmur, gallop or rub       Abdomen:   Benign exam without organomegaly or mass palpable    GU: nl genitalia Jeffrey Soto/o hernia             Rectal:  per Urology    Extremities: Wrists : mild decreased rom  no edema   Neurologic: Grossly nonfocal    EKG: NSR. Voltage criteria for LVH Jeffrey Soto/o ST, T changes, may be normal  .    Recent Results (from the past 8 hour(s))   AMB POC COMPLETE CBC,AUTOMATED ENTER    Collection Time: 11/01/18  3:03 PM   Result Value Ref Range    WBC (POC) 8.9 4.5 - 10.5 K/uL    LYMPHOCYTES (POC) 29.0 20.5 - 51.1 %    MONOCYTES (POC)      GRANULOCYTES (POC)      ABS. LYMPHS (POC) 2.0 1.2 - 3.4 K/uL    ABS. MONOS (POC)      ABS. GRANS (POC)      RBC (POC) 5.14 4.00 - 6.00 M/uL    HGB (POC) 14.9 11 - 18 g/dL    HCT (POC) 51.7 00.1 - 60.0 %    MCV (POC) 86.4 80.0 - 99.9 fL    MCH (POC)      MCHC (POC)      RDW (POC)      PLATELET (POC) 203 150 - 450 K/uL    MPV (POC)                     Assessment/Plan    ICD-10-CM ICD-9-CM     1. Essential hypertension  I10 401.9 COLLECTION VENOUS BLOOD,VENIPUNCTURE      AMB POC COMPLETE CBC,AUTOMATED ENTER      LIPID PANEL      METABOLIC PANEL, COMPREHENSIVE      AMB POC EKG ROUTINE Jeffrey Soto/ 12 LEADS, INTER & REP   2. Medicare annual wellness visit, subsequent  Z00.00 V70.0 COLLECTION VENOUS BLOOD,VENIPUNCTURE      AMB POC COMPLETE CBC,AUTOMATED ENTER      LIPID PANEL      METABOLIC PANEL, COMPREHENSIVE      AMB POC EKG ROUTINE Jeffrey Soto/ 12 LEADS, INTER & REP   3. Advanced directives, counseling/discussion  Z71.89 V65.49 ADVANCE CARE PLANNING FIRST 30 MINS      COLLECTION VENOUS BLOOD,VENIPUNCTURE      AMB POC COMPLETE CBC,AUTOMATED ENTER      LIPID PANEL      METABOLIC PANEL, COMPREHENSIVE      AMB POC EKG ROUTINE Jeffrey Soto/ 12 LEADS, INTER & REP   4. Encounter for long-term (current) use of other medications  Z79.899 V58.69 COLLECTION VENOUS BLOOD,VENIPUNCTURE      AMB POC COMPLETE CBC,AUTOMATED ENTER      LIPID PANEL  METABOLIC PANEL, COMPREHENSIVE      AMB POC EKG ROUTINE Jeffrey Soto/ 12 LEADS, INTER & REP   5. Hypercholesterolemia  E78.00 272.0 COLLECTION VENOUS BLOOD,VENIPUNCTURE      AMB POC COMPLETE CBC,AUTOMATED ENTER      LIPID PANEL      METABOLIC PANEL, COMPREHENSIVE      AMB POC EKG ROUTINE Jeffrey Soto/ 12 LEADS, INTER & REP   6. Cough  R05 786.2 COLLECTION VENOUS BLOOD,VENIPUNCTURE      AMB POC COMPLETE CBC,AUTOMATED ENTER      LIPID PANEL      METABOLIC PANEL, COMPREHENSIVE      AMB POC EKG ROUTINE Jeffrey Soto/ 12 LEADS, INTER & REP   7. Gastroesophageal reflux disease, esophagitis presence not specified  K21.9 530.81 COLLECTION VENOUS BLOOD,VENIPUNCTURE      AMB POC COMPLETE CBC,AUTOMATED ENTER      LIPID PANEL      METABOLIC PANEL, COMPREHENSIVE      AMB POC EKG ROUTINE Jeffrey Soto/ 12 LEADS, INTER & REP   8. Prostate cancer ([facility])  C61 185 COLLECTION VENOUS BLOOD,VENIPUNCTURE      AMB POC COMPLETE CBC,AUTOMATED ENTER      LIPID PANEL      METABOLIC PANEL, COMPREHENSIVE      AMB POC EKG ROUTINE Malyia Moro/ 12 LEADS, INTER & REP    9. Need for Tdap vaccination  Z23 V06.1    10. Abrasion  T14.8XXA 919.0 TETANUS, DIPHTHERIA TOXOIDS AND ACELLULAR PERTUSSIS VACCINE (TDAP), IN INDIVIDS. >=7, IM      Hx white coat + HTN . Check bp at home ; call me results     Await lipids     Cough and chest pain may be 2/2 GERD   incr  Prevacid to 30 mg daily     Abrasion in past. Tetanus needs update.   TDAP given .     Prostate ca . F/u [facility]     RV 1 y  Or prn sooner     Author:  Ysidro EvertW. Clifford Hendrix IV, MD 2:33 PM9/03/2018

## 2018-11-01 NOTE — Progress Notes (Signed)
Left VM results. Advised wt loss via ADA diet and exercise

## 2018-11-02 LAB — METABOLIC PANEL, COMPREHENSIVE
A-G Ratio: 1.9 (ref 1.2–2.2)
ALT (SGPT): 29 IU/L (ref 0–44)
AST (SGOT): 24 IU/L (ref 0–40)
Albumin: 4.7 g/dL (ref 3.7–4.7)
Alk. phosphatase: 66 IU/L (ref 39–117)
BUN/Creatinine ratio: 22 (ref 10–24)
BUN: 19 mg/dL (ref 8–27)
Bilirubin, total: 0.5 mg/dL (ref 0.0–1.2)
CO2: 21 mmol/L (ref 20–29)
Calcium: 9.6 mg/dL (ref 8.6–10.2)
Chloride: 105 mmol/L (ref 96–106)
Creatinine: 0.87 mg/dL (ref 0.76–1.27)
GFR est AA: 100 mL/min/{1.73_m2} (ref >59)
GFR est non-AA: 86 mL/min/{1.73_m2} (ref >59)
GLOBULIN, TOTAL: 2.5 g/dL (ref 1.5–4.5)
Glucose: 90 mg/dL (ref 65–99)
Potassium: 4 mmol/L (ref 3.5–5.2)
Protein, total: 7.2 g/dL (ref 6.0–8.5)
Sodium: 142 mmol/L (ref 134–144)

## 2018-11-02 LAB — LIPID PANEL
Cholesterol, Total: 183 mg/dL (ref 100–199)
Cholesterol, total: 183 mg/dL (ref 100–199)
HDL Cholesterol: 33 mg/dL — ABNORMAL LOW (ref >39)
HDL: 33 mg/dL — ABNORMAL LOW (ref >39)
LDL Calculated: 105 mg/dL — ABNORMAL HIGH (ref 0–99)
LDL, calculated: 105 mg/dL — ABNORMAL HIGH (ref 0–99)
Triglyceride: 264 mg/dL — ABNORMAL HIGH (ref 0–149)
Triglycerides: 264 mg/dL — ABNORMAL HIGH (ref 0–149)
VLDL, calculated: 45 mg/dL — ABNORMAL HIGH (ref 5–40)
VLDL: 45 mg/dL — ABNORMAL HIGH (ref 5–40)

## 2018-11-02 LAB — COMPREHENSIVE METABOLIC PANEL
ALT: 29 IU/L (ref 0–44)
AST: 24 IU/L (ref 0–40)
Albumin/Globulin Ratio: 1.9 NA (ref 1.2–2.2)
Albumin: 4.7 g/dL (ref 3.7–4.7)
Alkaline Phosphatase: 66 IU/L (ref 39–117)
BUN: 19 mg/dL (ref 8–27)
Bun/Cre Ratio: 22 NA (ref 10–24)
CO2: 21 mmol/L (ref 20–29)
Calcium: 9.6 mg/dL (ref 8.6–10.2)
Chloride: 105 mmol/L (ref 96–106)
Creatinine: 0.87 mg/dL (ref 0.76–1.27)
EGFR IF NonAfrican American: 86 mL/min/{1.73_m2} (ref >59)
GFR African American: 100 mL/min/{1.73_m2} (ref >59)
Globulin, Total: 2.5 g/dL (ref 1.5–4.5)
Glucose: 90 mg/dL (ref 65–99)
Potassium: 4 mmol/L (ref 3.5–5.2)
Sodium: 142 mmol/L (ref 134–144)
Total Bilirubin: 0.5 mg/dL (ref 0.0–1.2)
Total Protein: 7.2 g/dL (ref 6.0–8.5)

## 2018-11-10 NOTE — Telephone Encounter (Signed)
Patient is reporting his BP this morning is 168/110 he would like to talk to Dr. Doreene Soto please     Pt ph# [phone]

## 2018-11-10 NOTE — Telephone Encounter (Signed)
pls put on 11/11/2018 schedule at 130 pm for 15 min appt

## 2018-11-10 NOTE — Telephone Encounter (Signed)
This am diarrhea nausea , lightheaded. bp 185/110 at home. Now feels better home bp 165/100. Advised take extra Lisinopril 20 mg now   F/u appt tomorrow at 130 pm . F/u sooner prn

## 2018-11-11 ENCOUNTER — Ambulatory Visit: Admit: 2018-11-11 | Attending: Internal Medicine | Primary: Internal Medicine

## 2018-11-11 ENCOUNTER — Ambulatory Visit: Attending: Internal Medicine | Primary: Internal Medicine

## 2018-11-11 DIAGNOSIS — R0789 Other chest pain: Secondary | ICD-10-CM

## 2018-11-11 MED ORDER — FAMOTIDINE 20 MG TAB
20 mg | ORAL_TABLET | Freq: Two times a day (BID) | ORAL | 1 refills | Status: DC
Start: 2018-11-11 — End: 2019-01-14

## 2018-11-11 MED ORDER — FLUOXETINE 10 MG TAB
10 mg | ORAL_TABLET | Freq: Every day | ORAL | 1 refills | Status: DC
Start: 2018-11-11 — End: 2019-01-17

## 2018-11-11 NOTE — Progress Notes (Signed)
Jeffrey Soto is a 73 y.o. Jeffrey Soto/m CC: f/u     SUBJECTIVE:    recent mild htn on home bp readings   2 days ago : felt malaise briefly improved with playing golf   2 nights ago diarrhea interupted sleep  Yesterday am  Home bp 160s/100s; took extra Lisinopril   F/u bp 160 /97    Having gerd better with bland diet   Despite Prevacid 30 mg daily      Left toes still numbness better on lower salt diet   Past Medical History:   Diagnosis Date   ??? Arrhythmia     Hx PAT. dr. Ezequiel Ganser evaluated.    ??? Depression    ??? Elevated lipids 12/16/2009   ??? Elevated PSA     Dr. Ceasar Mons    ??? Fatty liver    ??? Gallbladder polyp    ??? GERD (gastroesophageal reflux disease) 06/26/03    EGD: duodenitis , dilation of Esophageal ring , DR. [name]   ??? HTN (hypertension) 12/16/2009   ??? HX OTHER MEDICAL     cubital tunnel,left elbow   ??? Prostate cancer ([place]) 06/02/2017    Low grade by report . 2019 . Dr. Ceasar Mons      Current Outpatient Medications   Medication Sig Dispense Refill   ??? famotidine (PEPCID) 20 mg tablet Take 1 Tab by mouth two (2) times a day. 60 Tab 1   ??? FLUoxetine (PROzac) 10 mg tablet Take 1 Tab by mouth daily. 30 Tab 1   ??? psyllium (METAMUCIL) powd Take  by mouth daily.     ??? Cartia XT 300 mg capsule TAKE ONE CAPSULE BY MOUTH DAILY 90 Cap 3   ??? lisinopriL (PRINIVIL, ZESTRIL) 20 mg tablet TAKE ONE TABLET BY MOUTH EVERY MORNING 90 Tab 3   ??? atorvastatin (Lipitor) 10 mg tablet Take  by mouth daily.     ??? meloxicam (MOBIC) 15 mg tablet Take 15 mg by mouth daily as needed for Pain.     ??? diphenhydrAMINE (BENADRYL) 50 mg tablet Take 50 mg by mouth nightly.     ??? diltiazem CD (CARTIA XT) 300 mg ER capsule TAKE ONE CAPSULE BY MOUTH DAILY 90 Cap 3   ??? lansoprazole (PREVACID) 15 mg capsule Take  by mouth Daily (before breakfast).     ??? aspirin delayed-release 81 mg tablet Take  by mouth daily.       No Known Allergies  .  Social History     Tobacco Use   Smoking Status Former Smoker   Smokeless Tobacco Never Used         Review of  Systems  Review of Systems - negative except as per HPI    One episode sob briefly on moderate exertion   Parthena Fergeson/o chest pain .   Today felt brief heart pounding   Recent EKG essentially normal      Visit Vitals  BP 130/82   Temp 98.8 ??F (37.1 ??C)   Ht 6' 1"  (1.854 m)   Wt 216 lb (98 kg)   BMI 28.50 kg/m??     Physical Exam    General: Appears in no acute distress  Lung-clear  Heart - reg [name]/o mgr  Abdomen -neg  Extremities - no pedal edema   2+ DP pulses  Left toes slight decr LT sensation     Labs: No results found for this or any previous visit (from the past 8 hour(s)).    Marland Kitchen  Results for orders placed or performed in visit on 11/01/18   LIPID PANEL   Result Value Ref Range    Cholesterol, total 183 100 - 199 mg/dL    Triglyceride 264 (H) 0 - 149 mg/dL    HDL Cholesterol 33 (L) >39 mg/dL    VLDL Cholesterol Cal 45 (H) 5 - 40 mg/dL    LDL Chol Calc (NIH) 105 (H) 0 - 99 mg/dL    Comment: CANCELED    METABOLIC PANEL, COMPREHENSIVE   Result Value Ref Range    Glucose 90 65 - 99 mg/dL    BUN 19 8 - 27 mg/dL    Creatinine 0.87 0.76 - 1.27 mg/dL    GFR est non-AA 86 >59 mL/min/1.73    GFR est AA 100 >59 mL/min/1.73    BUN/Creatinine ratio 22 10 - 24    Sodium 142 134 - 144 mmol/L    Potassium 4.0 3.5 - 5.2 mmol/L    Chloride 105 96 - 106 mmol/L    CO2 21 20 - 29 mmol/L    Calcium 9.6 8.6 - 10.2 mg/dL    Protein, total 7.2 6.0 - 8.5 g/dL    Albumin 4.7 3.7 - 4.7 g/dL    GLOBULIN, TOTAL 2.5 1.5 - 4.5 g/dL    A-G Ratio 1.9 1.2 - 2.2    Bilirubin, total 0.5 0.0 - 1.2 mg/dL    Alk. phosphatase 66 39 - 117 IU/L    AST (SGOT) 24 0 - 40 IU/L    ALT (SGPT) 29 0 - 44 IU/L   AMB POC COMPLETE CBC,AUTOMATED ENTER   Result Value Ref Range    WBC (POC) 8.9 4.5 - 10.5 K/uL    LYMPHOCYTES (POC) 29.0 20.5 - 51.1 %    MONOCYTES (POC)      GRANULOCYTES (POC)      ABS. LYMPHS (POC) 2.0 1.2 - 3.4 K/uL    ABS. MONOS (POC)      ABS. GRANS (POC)      RBC (POC) 5.14 4.00 - 6.00 M/uL    HGB (POC) 14.9 11 - 18 g/dL    HCT (POC) 44.4 35.0 - 60.0 %    MCV  (POC) 86.4 80.0 - 99.9 fL    MCH (POC)      MCHC (POC)      RDW (POC)      PLATELET (POC) 203 150 - 450 K/uL    MPV (POC)          Assessment/Plan    ICD-10-CM ICD-9-CM    1. Other chest pain  R07.89 786.59 REFERRAL TO CARDIOLOGY   2. Gastroesophageal reflux disease, esophagitis presence not specified  K21.9 530.81 REFERRAL TO GASTROENTEROLOGY   3. Essential hypertension  I10 401.9      Referred to Cardiology   Add pepcid and referred to GI.   Suspect gastroenteritis yesterday and bp reactively went up. Now bp nl. Continue usual bp rx   Follow upPRN.    Author:  Darlina Rumpf, MD 1:29 PM9/01/2019

## 2018-11-11 NOTE — Patient Instructions (Signed)
Continue low salt and bland diet   Continue same medications including 2 Prevacid daily   Add Pepcid one pill with breakfast and dinner   Call Dr. Steva Ready for an appointment   Call [facility] for a Cardiology appointment.   [phone] and ask for [facility] Doctor's office .

## 2018-11-11 NOTE — Progress Notes (Signed)
Jeffrey Soto is a 73 y.o. Jeffrey Soto/m CC: f/u     SUBJECTIVE:    recent mild htn on home bp readings   2 days ago : felt malaise briefly improved with playing golf   2 nights ago diarrhea interupted sleep  Yesterday am  Home bp 160s/100s; took extra Lisinopril   F/u bp 160 /97    Having gerd better with bland diet   Despite Prevacid 30 mg daily      Left toes still numbness better on lower salt diet   Past Medical History:   Diagnosis Date   ??? Arrhythmia     Hx PAT. dr. Ezequiel Soto evaluated.    ??? Depression    ??? Elevated lipids 12/16/2009   ??? Elevated PSA     Dr. Ceasar Soto    ??? Fatty liver    ??? Gallbladder polyp    ??? GERD (gastroesophageal reflux disease) 06/26/03    EGD: duodenitis , dilation of Esophageal ring , DR. [name]   ??? HTN (hypertension) 12/16/2009   ??? HX OTHER MEDICAL     cubital tunnel,left elbow   ??? Prostate cancer (Jeffrey Soto) 06/02/2017    Low grade by report . 2019 . Dr. Ceasar Soto      Current Outpatient Medications   Medication Sig Dispense Refill   ??? famotidine (PEPCID) 20 mg tablet Take 1 Tab by mouth two (2) times a day. 60 Tab 1   ??? FLUoxetine (PROzac) 10 mg tablet Take 1 Tab by mouth daily. 30 Tab 1   ??? psyllium (METAMUCIL) powd Take  by mouth daily.     ??? Cartia XT 300 mg capsule TAKE ONE CAPSULE BY MOUTH DAILY 90 Cap 3   ??? lisinopriL (PRINIVIL, ZESTRIL) 20 mg tablet TAKE ONE TABLET BY MOUTH EVERY MORNING 90 Tab 3   ??? atorvastatin (Lipitor) 10 mg tablet Take  by mouth daily.     ??? meloxicam (MOBIC) 15 mg tablet Take 15 mg by mouth daily as needed for Pain.     ??? diphenhydrAMINE (BENADRYL) 50 mg tablet Take 50 mg by mouth nightly.     ??? diltiazem CD (CARTIA XT) 300 mg ER capsule TAKE ONE CAPSULE BY MOUTH DAILY 90 Cap 3   ??? lansoprazole (PREVACID) 15 mg capsule Take  by mouth Daily (before breakfast).     ??? aspirin delayed-release 81 mg tablet Take  by mouth daily.       No Known Allergies  .  Social History     Tobacco Use   Smoking Status Former Smoker   Smokeless Tobacco Never Used         Review of Systems   Review of Systems - negative except as per HPI    One episode sob briefly on moderate exertion   Jeffrey Soto/o chest pain .   Today felt brief heart pounding   Recent EKG essentially normal      Visit Vitals  BP 130/82   Temp 98.8 ??F (37.1 ??C)   Ht '6\' 1"'  (1.854 m)   Wt 216 lb (98 kg)   BMI 28.50 kg/m??     Physical Exam    General: Appears in no acute distress  Lung-clear  Heart - reg Jeffrey Soto/o mgr  Abdomen -neg  Extremities - no pedal edema   2+ DP pulses  Left toes slight decr LT sensation     Labs: No results found for this or any previous visit (from the past 8 hour(s)).    Marland Kitchen  Results for orders placed or performed in visit on 11/01/18   LIPID PANEL   Result Value Ref Range    Cholesterol, total 183 100 - 199 mg/dL    Triglyceride 264 (H) 0 - 149 mg/dL    HDL Cholesterol 33 (L) >39 mg/dL    VLDL Cholesterol Cal 45 (H) 5 - 40 mg/dL    LDL Chol Calc (NIH) 105 (H) 0 - 99 mg/dL    Comment: CANCELED    METABOLIC PANEL, COMPREHENSIVE   Result Value Ref Range    Glucose 90 65 - 99 mg/dL    BUN 19 8 - 27 mg/dL    Creatinine 0.87 0.76 - 1.27 mg/dL    GFR est non-AA 86 >59 mL/min/1.73    GFR est AA 100 >59 mL/min/1.73    BUN/Creatinine ratio 22 10 - 24    Sodium 142 134 - 144 mmol/L    Potassium 4.0 3.5 - 5.2 mmol/L    Chloride 105 96 - 106 mmol/L    CO2 21 20 - 29 mmol/L    Calcium 9.6 8.6 - 10.2 mg/dL    Protein, total 7.2 6.0 - 8.5 g/dL    Albumin 4.7 3.7 - 4.7 g/dL    GLOBULIN, TOTAL 2.5 1.5 - 4.5 g/dL    A-G Ratio 1.9 1.2 - 2.2    Bilirubin, total 0.5 0.0 - 1.2 mg/dL    Alk. phosphatase 66 39 - 117 IU/L    AST (SGOT) 24 0 - 40 IU/L    ALT (SGPT) 29 0 - 44 IU/L   AMB POC COMPLETE CBC,AUTOMATED ENTER   Result Value Ref Range    WBC (POC) 8.9 4.5 - 10.5 K/uL    LYMPHOCYTES (POC) 29.0 20.5 - 51.1 %    MONOCYTES (POC)      GRANULOCYTES (POC)      ABS. LYMPHS (POC) 2.0 1.2 - 3.4 K/uL    ABS. MONOS (POC)      ABS. GRANS (POC)      RBC (POC) 5.14 4.00 - 6.00 M/uL    HGB (POC) 14.9 11 - 18 g/dL    HCT (POC) 44.4 35.0 - 60.0 %     MCV (POC) 86.4 80.0 - 99.9 fL    MCH (POC)      MCHC (POC)      RDW (POC)      PLATELET (POC) 203 150 - 450 K/uL    MPV (POC)          Assessment/Plan    ICD-10-CM ICD-9-CM    1. Other chest pain  R07.89 786.59 REFERRAL TO CARDIOLOGY   2. Gastroesophageal reflux disease, esophagitis presence not specified  K21.9 530.81 REFERRAL TO GASTROENTEROLOGY   3. Essential hypertension  I10 401.9      Referred to Cardiology   Add pepcid and referred to GI.   Suspect gastroenteritis yesterday and bp reactively went up. Now bp nl. Continue usual bp rx   Follow upPRN.    Author:  Darlina Rumpf, MD 1:29 PM9/01/2019

## 2018-11-15 MED ORDER — ATORVASTATIN 10 MG TAB
10 mg | ORAL_TABLET | ORAL | 1 refills | Status: DC
Start: 2018-11-15 — End: 2019-05-23

## 2018-11-16 NOTE — Telephone Encounter (Signed)
Spoke to patient I sent him name and number and he will call betty and set up appt

## 2018-11-16 NOTE — Telephone Encounter (Signed)
Kathie Rhodes from Cardiovascular has been trying to call the Pt to schedule him and she can not get in touch with him, she has the right phone number

## 2018-11-16 NOTE — Telephone Encounter (Signed)
Called patient and lvm to call me

## 2019-01-14 MED ORDER — FAMOTIDINE 20 MG TAB
20 mg | ORAL_TABLET | ORAL | 11 refills | Status: DC
Start: 2019-01-14 — End: 2020-01-20

## 2019-01-17 MED ORDER — FLUOXETINE 10 MG TAB
10 mg | ORAL_TABLET | ORAL | 5 refills | Status: DC
Start: 2019-01-17 — End: 2019-07-04

## 2019-02-11 ENCOUNTER — Encounter (HOSPITAL_BASED_OUTPATIENT_CLINIC_OR_DEPARTMENT_OTHER): Payer: Self-pay | Admitting: *Deleted

## 2019-02-11 ENCOUNTER — Emergency Department (HOSPITAL_BASED_OUTPATIENT_CLINIC_OR_DEPARTMENT_OTHER): Payer: Medicare HMO

## 2019-02-11 ENCOUNTER — Inpatient Hospital Stay: Admission: AD | Admit: 2019-02-11 | Payer: Self-pay | Source: Other Acute Inpatient Hospital | Admitting: Cardiology

## 2019-02-11 ENCOUNTER — Observation Stay (HOSPITAL_BASED_OUTPATIENT_CLINIC_OR_DEPARTMENT_OTHER)
Admission: EM | Admit: 2019-02-11 | Discharge: 2019-02-12 | Disposition: A | Discharge status: Home / Self Care | Payer: Medicare HMO | Attending: Cardiology | Admitting: Cardiology

## 2019-02-11 ENCOUNTER — Other Ambulatory Visit: Payer: Self-pay

## 2019-02-11 DIAGNOSIS — I10 Essential (primary) hypertension: Secondary | ICD-10-CM | POA: Insufficient documentation

## 2019-02-11 DIAGNOSIS — R6884 Jaw pain: Secondary | ICD-10-CM | POA: Diagnosis not present

## 2019-02-11 DIAGNOSIS — Z794 Long term (current) use of insulin: Secondary | ICD-10-CM | POA: Insufficient documentation

## 2019-02-11 DIAGNOSIS — R7989 Other specified abnormal findings of blood chemistry: Secondary | ICD-10-CM | POA: Insufficient documentation

## 2019-02-11 DIAGNOSIS — Z87891 Personal history of nicotine dependence: Secondary | ICD-10-CM | POA: Diagnosis not present

## 2019-02-11 DIAGNOSIS — Z7982 Long term (current) use of aspirin: Secondary | ICD-10-CM | POA: Diagnosis not present

## 2019-02-11 DIAGNOSIS — Z885 Allergy status to narcotic agent status: Secondary | ICD-10-CM | POA: Diagnosis not present

## 2019-02-11 DIAGNOSIS — I214 Non-ST elevation (NSTEMI) myocardial infarction: Secondary | ICD-10-CM | POA: Diagnosis present

## 2019-02-11 DIAGNOSIS — Z951 Presence of aortocoronary bypass graft: Secondary | ICD-10-CM | POA: Diagnosis not present

## 2019-02-11 DIAGNOSIS — Z791 Long term (current) use of non-steroidal anti-inflammatories (NSAID): Secondary | ICD-10-CM | POA: Insufficient documentation

## 2019-02-11 DIAGNOSIS — I67841 Reversible cerebrovascular vasoconstriction syndrome: Secondary | ICD-10-CM | POA: Diagnosis not present

## 2019-02-11 DIAGNOSIS — I4892 Unspecified atrial flutter: Secondary | ICD-10-CM | POA: Diagnosis not present

## 2019-02-11 DIAGNOSIS — E1151 Type 2 diabetes mellitus with diabetic peripheral angiopathy without gangrene: Secondary | ICD-10-CM | POA: Diagnosis not present

## 2019-02-11 DIAGNOSIS — Z888 Allergy status to other drugs, medicaments and biological substances status: Secondary | ICD-10-CM | POA: Insufficient documentation

## 2019-02-11 DIAGNOSIS — Z20828 Contact with and (suspected) exposure to other viral communicable diseases: Secondary | ICD-10-CM | POA: Diagnosis not present

## 2019-02-11 DIAGNOSIS — R220 Localized swelling, mass and lump, head: Secondary | ICD-10-CM

## 2019-02-11 DIAGNOSIS — R778 Other specified abnormalities of plasma proteins: Secondary | ICD-10-CM | POA: Diagnosis present

## 2019-02-11 DIAGNOSIS — I2510 Atherosclerotic heart disease of native coronary artery without angina pectoris: Secondary | ICD-10-CM | POA: Insufficient documentation

## 2019-02-11 DIAGNOSIS — R079 Chest pain, unspecified: Secondary | ICD-10-CM

## 2019-02-11 DIAGNOSIS — Z79899 Other long term (current) drug therapy: Secondary | ICD-10-CM | POA: Diagnosis not present

## 2019-02-11 DIAGNOSIS — I491 Atrial premature depolarization: Secondary | ICD-10-CM | POA: Diagnosis not present

## 2019-02-11 HISTORY — DX: Atherosclerotic heart disease of native coronary artery without angina pectoris: I25.10

## 2019-02-11 HISTORY — DX: Essential (primary) hypertension: I10

## 2019-02-11 HISTORY — DX: Type 2 diabetes mellitus without complications: E11.9

## 2019-02-11 LAB — TROPONIN I (HIGH SENSITIVITY)
Troponin I (High Sensitivity): 24 ng/L — ABNORMAL HIGH (ref <18)
Troponin I (High Sensitivity): 47 ng/L — ABNORMAL HIGH (ref <18)

## 2019-02-11 LAB — CBC WITH DIFFERENTIAL/PLATELET
Abs Immature Granulocytes: 0 10*3/uL (ref 0.00–0.07)
Basophils Absolute: 0 10*3/uL (ref 0.0–0.1)
Basophils Relative: 0 %
Eosinophils Absolute: 0 10*3/uL (ref 0.0–0.5)
Eosinophils Relative: 0 %
HCT: 42.6 % (ref 39.0–52.0)
Hemoglobin: 14.7 g/dL (ref 13.0–17.0)
Immature Granulocytes: 0 %
Lymphocytes Relative: 17 %
Lymphs Abs: 0.7 10*3/uL (ref 0.7–4.0)
MCH: 32 pg (ref 26.0–34.0)
MCHC: 34.5 g/dL (ref 30.0–36.0)
MCV: 92.6 fL (ref 80.0–100.0)
Monocytes Absolute: 0.5 10*3/uL (ref 0.1–1.0)
Monocytes Relative: 11 %
Neutro Abs: 3.1 10*3/uL (ref 1.7–7.7)
Neutrophils Relative %: 72 %
Platelets: 183 10*3/uL (ref 150–400)
RBC: 4.6 MIL/uL (ref 4.22–5.81)
RDW: 12.6 % (ref 11.5–15.5)
WBC: 4.3 10*3/uL (ref 4.0–10.5)
nRBC: 0 % (ref 0.0–0.2)

## 2019-02-11 LAB — BASIC METABOLIC PANEL
Anion gap: 10 (ref 5–15)
BUN: 15 mg/dL (ref 8–23)
CO2: 23 mmol/L (ref 22–32)
Calcium: 9 mg/dL (ref 8.9–10.3)
Chloride: 99 mmol/L (ref 98–111)
Creatinine, Ser: 1.07 mg/dL (ref 0.61–1.24)
GFR calc Af Amer: >60 mL/min (ref >60)
GFR calc non Af Amer: >60 mL/min (ref >60)
Glucose, Bld: 341 mg/dL — ABNORMAL HIGH (ref 70–99)
Potassium: 4.2 mmol/L (ref 3.5–5.1)
Sodium: 132 mmol/L — ABNORMAL LOW (ref 135–145)

## 2019-02-11 LAB — SARS CORONAVIRUS 2 AG (30 MIN TAT): SARS Coronavirus 2 Ag: NEGATIVE

## 2019-02-11 LAB — CBG MONITORING, ED: Glucose-Capillary: 189 mg/dL — ABNORMAL HIGH (ref 70–99)

## 2019-02-11 MED ORDER — AMOXICILLIN 500 MG PO CAPS
500.0000 mg | ORAL_CAPSULE | Freq: Once | ORAL | Status: AC
  Administered 2019-02-11: 500 mg via ORAL
  Filled 2019-02-11: qty 1

## 2019-02-11 MED ORDER — NITROGLYCERIN 2 % TD OINT
0.5000 [in_us] | TOPICAL_OINTMENT | Freq: Once | TRANSDERMAL | Status: AC
  Administered 2019-02-11: 0.5 [in_us] via TOPICAL
  Filled 2019-02-11: qty 1

## 2019-02-11 MED ORDER — HEPARIN BOLUS VIA INFUSION
4000.0000 [IU] | Freq: Once | INTRAVENOUS | Status: AC
  Administered 2019-02-11: 4000 [IU] via INTRAVENOUS

## 2019-02-11 MED ORDER — HEPARIN (PORCINE) 25000 UT/250ML-% IV SOLN
1000.0000 [IU]/h | INTRAVENOUS | Status: DC
  Administered 2019-02-11: 19:00:00 1000 [IU]/h via INTRAVENOUS
  Filled 2019-02-11: qty 250

## 2019-02-11 MED ORDER — ACETAMINOPHEN 325 MG PO TABS
650.0000 mg | ORAL_TABLET | Freq: Once | ORAL | Status: AC
  Administered 2019-02-11: 23:00:00 650 mg via ORAL
  Filled 2019-02-11: qty 2

## 2019-02-11 MED ORDER — HYDROMORPHONE HCL 1 MG/ML IJ SOLN
0.5000 mg | Freq: Once | INTRAMUSCULAR | Status: AC
  Administered 2019-02-11: 0.5 mg via INTRAVENOUS
  Filled 2019-02-11: qty 1

## 2019-02-11 MED ORDER — ASPIRIN 81 MG PO CHEW
324.0000 mg | CHEWABLE_TABLET | Freq: Once | ORAL | Status: AC
  Administered 2019-02-11: 324 mg via ORAL
  Filled 2019-02-11: qty 4

## 2019-02-11 NOTE — ED Notes (Signed)
Pt agitated by phillips monitor alarms. Alarm reasons discussed with patient and RN educated pt that there isn't anything that can be done to stop alarms, for his own safety.

## 2019-02-11 NOTE — H&P (Addendum)
Darrell Kane is an 73 y.o. male.   Chief Complaint: Jaw/facial pain HPI:   73 y.o. Caucasian male  with hypertension, uncontrolled type 2 DM, CAD s/p h/o CABG, PAD, with jaw/facial pain.  Patient works as a Neurosurgeon, fairly active at baseline. He has been having constant left jaw pain since 12/10. He has not been able eat due to this pain. He last saw his dentist 3 months ago and was told to have cavities in his left lower teeth. However, given that he had jaw pain prior to his CABG in 1999, there was concern for angina equivalent. HS trop was mildly elevated. He was thus transferred to [facility]. This morning, he is comfortable, but still has 7/10 left lower jaw pain, unchanged with NTG patch.   At baseline, he feels tired with walking, but denies chest pain, shortness of breath. He has stable claudication. He was recommend medical management with pletal, but it does not appear that he is taking it currently. He does not have any wounds or ulcers.   Past Medical History:  Diagnosis Date  . Coronary artery disease   . Diabetes mellitus without complication ([place])   . Hypertension     Past Surgical History:  Procedure Laterality Date  . APPENDECTOMY    . COLON SURGERY    . CORONARY ARTERY BYPASS GRAFT    . NECK SURGERY      No family history on file. Social History:  reports that he has quit smoking. He has never used smokeless tobacco. He reports current alcohol use. He reports that he does not use drugs.  Allergies:  Allergies  Allergen Reactions  . Morphine And Related Other (See Comments)    "hallucinations"  . Statins     Review of Systems  Constitution: Negative for decreased appetite, malaise/fatigue, weight gain and weight loss.  HENT: Negative for congestion.        Left lower jaw pain  Eyes: Negative for visual disturbance.  Cardiovascular: Negative for chest pain, dyspnea on exertion, leg swelling, palpitations and syncope.  Respiratory: Negative for  cough.   Endocrine: Negative for cold intolerance.  Hematologic/Lymphatic: Does not bruise/bleed easily.  Skin: Negative for itching and rash.  Musculoskeletal: Negative for myalgias.  Gastrointestinal: Negative for abdominal pain, nausea and vomiting.  Genitourinary: Negative for dysuria.  Neurological: Negative for dizziness and weakness.  Psychiatric/Behavioral: The patient is not nervous/anxious.   All other systems reviewed and are negative.    Blood pressure (!) 159/81, pulse 83, temperature 99.3 F (37.4 C), temperature source Oral, resp. rate 17, height 6' (1.829 m), weight 83.9 kg, SpO2 95 %. Body mass index is 25.09 kg/m.  Physical Exam  Constitutional: He is oriented to person, place, and time. He appears well-developed and well-nourished. No distress.  HENT:  Head: Normocephalic and atraumatic.  Left lower jaw tenderness. No definite abscess seen.  Eyes: Pupils are equal, round, and reactive to light. Conjunctivae are normal.  Neck: No JVD present.  Cardiovascular: Normal rate and regular rhythm.  No murmur heard. Pulses:      Dorsalis pedis pulses are 0 on the right side and 0 on the left side.       Posterior tibial pulses are 0 on the right side and 0 on the left side.  No critical limb ischemia  Pulmonary/Chest: Effort normal and breath sounds normal. He has no wheezes. He has no rales.  Abdominal: Soft. Bowel sounds are normal. There is no rebound.  Musculoskeletal:  General: No edema.  Lymphadenopathy:    He has no cervical adenopathy.  Neurological: He is alert and oriented to person, place, and time. No cranial nerve deficit.  Skin: Skin is warm and dry.  Psychiatric: He has a normal mood and affect.  Nursing note and vitals reviewed.   Results for orders placed or performed during the hospital encounter of 02/11/19 (from the past 48 hour(s))  Basic metabolic panel     Status: Abnormal   Collection Time: 02/11/19  2:20 PM  Result Value Ref Range    Sodium 132 (L) 135 - 145 mmol/L   Potassium 4.2 3.5 - 5.1 mmol/L   Chloride 99 98 - 111 mmol/L   CO2 23 22 - 32 mmol/L   Glucose, Bld 341 (H) 70 - 99 mg/dL   BUN 15 8 - 23 mg/dL   Creatinine, Ser 1.07 0.61 - 1.24 mg/dL   Calcium 9.0 8.9 - 10.3 mg/dL   GFR calc non Af Amer >60 >60 mL/min   GFR calc Af Amer >60 >60 mL/min   Anion gap 10 5 - 15    Comment: Performed at [facility], [address] [place], [place] [address]  CBC with Differential     Status: None   Collection Time: 02/11/19  2:20 PM  Result Value Ref Range   WBC 4.3 4.0 - 10.5 K/uL   RBC 4.60 4.22 - 5.81 MIL/uL   Hemoglobin 14.7 13.0 - 17.0 g/dL   HCT 42.6 39.0 - 52.0 %   MCV 92.6 80.0 - 100.0 fL   MCH 32.0 26.0 - 34.0 pg   MCHC 34.5 30.0 - 36.0 g/dL   RDW 12.6 11.5 - 15.5 %   Platelets 183 150 - 400 K/uL   nRBC 0.0 0.0 - 0.2 %   Neutrophils Relative % 72 %   Neutro Abs 3.1 1.7 - 7.7 K/uL   Lymphocytes Relative 17 %   Lymphs Abs 0.7 0.7 - 4.0 K/uL   Monocytes Relative 11 %   Monocytes Absolute 0.5 0.1 - 1.0 K/uL   Eosinophils Relative 0 %   Eosinophils Absolute 0.0 0.0 - 0.5 K/uL   Basophils Relative 0 %   Basophils Absolute 0.0 0.0 - 0.1 K/uL   Immature Granulocytes 0 %   Abs Immature Granulocytes 0.00 0.00 - 0.07 K/uL    Comment: Performed at [facility], [address] [place], [place] [address]  Troponin I (High Sensitivity)     Status: Abnormal   Collection Time: 02/11/19  2:20 PM  Result Value Ref Range   Troponin I (High Sensitivity) 24 (H) <18 ng/L    Comment: (NOTE) Elevated high sensitivity troponin I (hsTnI) values and significant  changes across serial measurements may suggest ACS but many other  chronic and acute conditions are known to elevate hsTnI results.  Refer to the "Links" section for chest pain algorithms and additional  guidance. Performed at [facility], [address] [place], [place] [address]   Troponin I (High Sensitivity)     Status:  Abnormal   Collection Time: 02/11/19  4:22 PM  Result Value Ref Range   Troponin I (High Sensitivity) 47 (H) <18 ng/L    Comment: (NOTE) Elevated high sensitivity troponin I (hsTnI) values and significant  changes across serial measurements may suggest ACS but many other  chronic and acute conditions are known to elevate hsTnI results.  Refer to the Links section for chest pain algorithms  and additional  guidance. Performed at [facility], [address] [place], [place] [address]   SARS Coronavirus 2 Ag (30 min TAT) - Nasal Swab (BD Veritor Kit)     Status: None   Collection Time: 02/11/19  5:34 PM   Specimen: Nasal Swab (BD Veritor Kit)  Result Value Ref Range   SARS Coronavirus 2 Ag NEGATIVE NEGATIVE    Comment: (NOTE) SARS-CoV-2 antigen NOT DETECTED.  Negative results are presumptive.  Negative results do not preclude SARS-CoV-2 infection and should not be used as the sole basis for treatment or other patient management decisions, including infection  control decisions, particularly in the presence of clinical signs and  symptoms consistent with COVID-19, or in those who have been in contact with the virus.  Negative results must be combined with clinical observations, patient history, and epidemiological information. The expected result is Negative. Fact Sheet for Patients: PodPark.tn Fact Sheet for Healthcare Providers: GiftContent.is This test is not yet approved or cleared by the [place] FDA and  has been authorized for detection and/or diagnosis of SARS-CoV-2 by FDA under an Emergency Use Authorization (EUA).  This EUA will remain in effect (meaning this test can be used) for the duration of  the COVID-19 de claration under Section 564(b)(1) of the Act, 21 U.S.C. section 360bbb-3(b)(1), unless the authorization is terminated or revoked sooner. Performed at [facility], [address] [place], [place] [address]     Labs:   Lab Results  Component Value Date   WBC 4.3 02/11/2019   HGB 14.7 02/11/2019   HCT 42.6 02/11/2019   MCV 92.6 02/11/2019   PLT 183 02/11/2019    Recent Labs  Lab 02/11/19 1420  NA 132*  K 4.2  CL 99  CO2 23  BUN 15  CREATININE 1.07  CALCIUM 9.0  GLUCOSE 341*   Results for STANISLAW, ACTON (MRN [id number]) as of 02/12/2019 07:56  Ref. Range 02/11/2019 14:20 02/11/2019 16:22 02/12/2019 01:49  Troponin I (High Sensitivity) Latest Ref Range: <18 ng/L 24 (H) 47 (H) 47 (H)      Current Facility-Administered Medications:  .  heparin ADULT infusion 100 units/mL (25000 units/252m sodium chloride 0.45%), 1,000 Units/hr, Intravenous, Continuous, EDuanne Limerick RPH .  heparin bolus via infusion 4,000 Units, 4,000 Units, Intravenous, Once, EDuanne Limerick RPH .  nitroGLYCERIN (NITROGLYN) 2 % ointment 0.5 inch, 0.5 inch, Topical, Once, MNoemi Chapel MD  Current Outpatient Medications:  .  atenolol (TENORMIN) 25 MG tablet, TAKE 1 TABLET EVERY DAY, Disp: , Rfl:  .  gabapentin (NEURONTIN) 100 MG capsule, TAKE 3 TO 4 CAPSULES A DAY, Disp: , Rfl:  .  Insulin Glargine, 1 Unit Dial, (TOUJEO SOLOSTAR) 300 UNIT/ML SOPN, INJECT  35 UNITS SUBCUTANEOUSLY EVERY DAY, Disp: , Rfl:  .  latanoprost (XALATAN) 0.005 % ophthalmic solution, INSTILL 1 DROP INTO THE LEFT EYE NIGHTLY. (DISCARD BOTTLE 42 DAYS AFTER OPENING), Disp: , Rfl:  .  nitroGLYCERIN (NITROSTAT) 0.4 MG SL tablet, Place under the tongue., Disp: , Rfl:  .  acetaminophen (TYLENOL) 325 MG tablet, Take by mouth., Disp: , Rfl:  .  allopurinol (ZYLOPRIM) 300 MG tablet, Take 300 mg by mouth daily., Disp: , Rfl:  .  ascorbic acid (VITAMIN C) 1000 MG tablet, Take by mouth., Disp: , Rfl:  .  aspirin 325 MG tablet, Take by mouth., Disp: , Rfl:  .  ciclopirox (PENLAC) 8 % solution, APPLY TOPICALLY NIGHTLY OVER A PREVIOUS COAT. AFTER SEVEN DAYS,  MAY REMOVE WITH ALCOHOL AND CONTINUE THE CYCLE, Disp: , Rfl:  .   cyanocobalamin 100 MCG tablet, Take by mouth., Disp: , Rfl:  .  ibuprofen (ADVIL) 200 MG tablet, Take by mouth., Disp: , Rfl:  .  insulin aspart protamine - aspart (NOVOLOG 70/30 MIX) (70-30) 100 UNIT/ML FlexPen, Inject into the skin., Disp: , Rfl:  .  lovastatin (MEVACOR) 40 MG tablet, Take by mouth., Disp: , Rfl:  .  Multiple Vitamin (MULTI-VITAMIN) tablet, Take by mouth., Disp: , Rfl:  .  NOVOLOG 100 UNIT/ML injection, , Disp: , Rfl:  .  omeprazole (PRILOSEC) 10 MG capsule, Take by mouth., Disp: , Rfl:  .  potassium citrate (UROCIT-K) 10 MEQ (1080 MG) SR tablet, Take 10 mEq by mouth 3 (three) times daily., Disp: , Rfl:  .  tamsulosin (FLOMAX) 0.4 MG CAPS capsule, Take 0.4 mg by mouth daily., Disp: , Rfl:  .  vitamin E 400 UNIT capsule, Take by mouth., Disp: , Rfl:    Today's Vitals   02/11/19 1522 02/11/19 1530 02/11/19 1600 02/11/19 1700  BP:  (!) 183/80 (!) 172/81 (!) 159/81  Pulse:  90 86 83  Resp:  '20 18 17  '$ Temp:      TempSrc:      SpO2:  100% 95% 95%  Weight:      Height:      PainSc: 7       Body mass index is 25.09 kg/m.  Results for DENY, CHEVEZ (MRN [id number]) as of 02/11/2019 18:16  Ref. Range 02/11/2019 14:20 02/11/2019 16:22  Troponin I (High Sensitivity) Latest Ref Range: <18 ng/L 24 (H) 47 (H)    CARDIAC STUDIES:  EKG 02/11/2019: Sinus rhythm 98 bpm. LVH. Inferolateral ST depression, cannot exclude ischemia.   ABI 04/2018: (R ABI 0.48, L ABI 1.22-artificially elevated  Carotid duplex 04/2018: R 21-39%, L 40-59% stenosis  Echocardiogram: None   Assessment/Plan   73 y.o. Caucasian male  with hypertension, uncontrolled type 2 DM, CAD s/p h/o CABG, PAD, with jaw/facial pain  Jaw pain/angina equivalent?: Pain is reproducible on palpation. I do not think this is angina equivalent. Pain has been constant for 4 days. 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 Trop HS is mildly elevated at 24-->47-->47.  I do not think this is  ACS. However, given pain similar to prior to CABG and mildly elevated, but not rising trop HS, will perform nuclear stress test.  If stress test negative or low to moderate risk, he can be discharged with close follow up with dentist.  Will arrange outpatient follow up with [place] for cardiac care.  Continue baseline medical management for CAD. Recommend Aspirin 81 mg, instead of 325 mg. With ongoing use of ibuprofen, he is at higher bleeding risk. Continue atenolol.    DM: Needs aggressive DM control. He is NPO. Will give mealtime insulin. Administer mealtime insulin tonight at home.  Hypertension: Uncontrolled. Increase atenolol to 50 mg daily.   Nigel Mormon, MD 02/11/2019, 6:29 PM [facility]. [facility] Pager: [phone] Office: [phone] If no answer: [phone]

## 2019-02-11 NOTE — ED Notes (Signed)
Pt on monitor 

## 2019-02-11 NOTE — ED Notes (Signed)
Pt states his facial pain is returning 8/10. EDP notified, V/O given for repeat EKG. EKG performed and given to EDP.

## 2019-02-11 NOTE — ED Provider Notes (Signed)
[facility] EMERGENCY DEPARTMENT Provider Note   CSN: [id number] Arrival date & time: 02/11/19  1313     History Chief Complaint  Patient presents with  . Facial Pain    Darrell Kane is a 73 y.o. male.  73 year old male presents with complaint of left side of mouth pain x2 days.  Patient states that he thought it was a tooth however has been unable to localize the pain to an individual tooth.  Denies fever, drainage, injury to the mouth.  Patient had his last dental cleaning about 5 months ago, was told he had several cavities that they were going to continue to monitor.  Patient also reports pain in his left ear, states last night he had pain in his jaw was in 1999 when he had to have bypass surgery.  Patient noticed today that he had some tightness in his chest from 12-1 today, denies associated shortness of breath, nausea, vomiting, diaphoresis.  Is not having any chest discomfort at this time.  Checked his vitals at home and blood pressure was elevated as was his heart rate.  No other complaints or concerns. History of 3 vessel CABG and stents.      HPI: A 73 year old patient with a history of treated diabetes and hypertension presents for evaluation of chest pain. Initial onset of pain was approximately 1-3 hours ago. The patient's chest pain is described as heaviness/pressure/tightness and is not worse with exertion. The patient's chest pain is not middle- or left-sided, is not well-localized, is not sharp and does not radiate to the arms/jaw/neck. The patient does not complain of nausea and denies diaphoresis. The patient has no history of stroke, has no history of peripheral artery disease, has not smoked in the past 90 days, has no relevant family history of coronary artery disease (first degree relative at less than age 65), has no history of hypercholesterolemia and does not have an elevated BMI (>=30).   Past Medical History:  Diagnosis Date  . Coronary artery disease     . Diabetes mellitus without complication ([facility])   . Hypertension     There are no problems to display for this patient.   Past Surgical History:  Procedure Laterality Date  . APPENDECTOMY    . COLON SURGERY    . CORONARY ARTERY BYPASS GRAFT    . NECK SURGERY         No family history on file.  Social History   Tobacco Use  . Smoking status: Former Games developer  . Smokeless tobacco: Never Used  Substance Use Topics  . Alcohol use: Yes    Comment: occasional  . Drug use: Never    Home Medications Prior to Admission medications   Medication Sig Start Date End Date Taking? Authorizing Provider  atenolol (TENORMIN) 25 MG tablet TAKE 1 TABLET EVERY DAY 12/30/18  Yes [provider]  gabapentin (NEURONTIN) 100 MG capsule TAKE 3 TO 4 CAPSULES A DAY 11/16/18  Yes [provider]  Insulin Glargine, 1 Unit Dial, (TOUJEO SOLOSTAR) 300 UNIT/ML SOPN INJECT  35 UNITS SUBCUTANEOUSLY EVERY DAY 06/18/15  Yes [provider]  latanoprost (XALATAN) 0.005 % ophthalmic solution INSTILL 1 DROP INTO THE LEFT EYE NIGHTLY. (DISCARD BOTTLE 42 DAYS AFTER OPENING) 01/16/19  Yes [provider]  nitroGLYCERIN (NITROSTAT) 0.4 MG SL tablet Place under the tongue. 10/02/16  Yes [provider]  acetaminophen (TYLENOL) 325 MG tablet Take by mouth.    [provider]  allopurinol (ZYLOPRIM) 300 MG tablet  Take 300 mg by mouth daily. 01/17/19   [provider]  ascorbic acid (VITAMIN C) 1000 MG tablet Take by mouth.    [provider]  aspirin 325 MG tablet Take by mouth.    [provider]  ciclopirox (PENLAC) 8 % solution APPLY TOPICALLY NIGHTLY OVER A PREVIOUS COAT. AFTER SEVEN DAYS, MAY REMOVE WITH ALCOHOL AND CONTINUE THE CYCLE 09/05/18   [provider]  cyanocobalamin 100 MCG tablet Take by mouth.    [provider]  ibuprofen (ADVIL) 200 MG tablet Take by mouth.    [provider]  insulin aspart  protamine - aspart (NOVOLOG 70/30 MIX) (70-30) 100 UNIT/ML FlexPen Inject into the skin.    [provider]  lovastatin (MEVACOR) 40 MG tablet Take by mouth.    [provider]  Multiple Vitamin (MULTI-VITAMIN) tablet Take by mouth.    [provider]  NOVOLOG 100 UNIT/ML injection  12/30/18   [provider]  omeprazole (PRILOSEC) 10 MG capsule Take by mouth.    [provider]  potassium citrate (UROCIT-K) 10 MEQ (1080 MG) SR tablet Take 10 mEq by mouth 3 (three) times daily. 12/06/18   [provider]  tamsulosin (FLOMAX) 0.4 MG CAPS capsule Take 0.4 mg by mouth daily. 02/03/19   [provider]  vitamin E 400 UNIT capsule Take by mouth.    [provider]    Allergies    Morphine and related and Statins  Review of Systems   Review of Systems  Constitutional: Negative for diaphoresis and fever.  HENT: Positive for dental problem, ear pain and facial swelling. Negative for trouble swallowing and voice change.   Respiratory: Positive for chest tightness. Negative for shortness of breath.   Cardiovascular: Positive for chest pain. Negative for palpitations and leg swelling.  Gastrointestinal: Negative for abdominal pain, nausea and vomiting.  Musculoskeletal: Negative for neck pain.  Skin: Negative for rash and wound.  Allergic/Immunologic: Positive for immunocompromised state.  Neurological: Negative for weakness and headaches.  Hematological: Negative for adenopathy.  Psychiatric/Behavioral: Negative for confusion.  All other systems reviewed and are negative.   Physical Exam Updated Vital Signs BP (!) 159/81   Pulse 83   Temp 99.3 F (37.4 C) (Oral)   Resp 17   Ht 6' (1.829 m)   Wt 83.9 kg   SpO2 95%   BMI 25.09 kg/m   Physical Exam Vitals and nursing note reviewed.  Constitutional:      General: He is not in acute distress.    Appearance: He is well-developed. He is not diaphoretic.  HENT:      Head: Atraumatic.      Right Ear: Tympanic membrane and ear canal normal.     Left Ear: Tympanic membrane and ear canal normal.     Mouth/Throat:     Mouth: Mucous membranes are moist.     Pharynx: No oropharyngeal exudate or posterior oropharyngeal erythema.   Cardiovascular:     Rate and Rhythm: Regular rhythm. Tachycardia present.     Pulses: Normal pulses.     Heart sounds: Normal heart sounds.  Pulmonary:     Effort: Pulmonary effort is normal.     Breath sounds: Normal breath sounds.  Abdominal:     Palpations: Abdomen is soft.     Tenderness: There is no abdominal tenderness.  Skin:    General: Skin is warm and dry.     Findings: No erythema or rash.  Neurological:  Mental Status: He is alert and oriented to person, place, and time.  Psychiatric:        Behavior: Behavior normal.     ED Results / Procedures / Treatments   Labs (all labs ordered are listed, but only abnormal results are displayed) Labs Reviewed  BASIC METABOLIC PANEL - Abnormal; Notable for the following components:      Result Value   Sodium 132 (*)    Glucose, Bld 341 (*)    All other components within normal limits  TROPONIN I (HIGH SENSITIVITY) - Abnormal; Notable for the following components:   Troponin I (High Sensitivity) 24 (*)    All other components within normal limits  TROPONIN I (HIGH SENSITIVITY) - Abnormal; Notable for the following components:   Troponin I (High Sensitivity) 47 (*)    All other components within normal limits  SARS CORONAVIRUS 2 AG (30 MIN TAT)  SARS CORONAVIRUS 2 (TAT 6-24 HRS)  CBC WITH DIFFERENTIAL/PLATELET    EKG EKG Interpretation  Date/Time:  Saturday February 11 2019 14:03:20 EST Ventricular Rate:  98 PR Interval:    QRS Duration: 97 QT Interval:  327 QTC Calculation: 418 R Axis:   75 Text Interpretation: Sinus rhythm Probable LVH with secondary repol abnrm ST depr, consider ischemia, inferior leads Anterior ST elevation, probably due to LVH No  old tracing to compare Confirmed by Davonna Belling [phone]) on 02/11/2019 2:07:04 PM   Radiology DG Chest Port 1 View  Result Date: 02/11/2019 CLINICAL DATA:  73 year old male with a history of chest tightness EXAM: PORTABLE CHEST 1 VIEW COMPARISON:  CT 08/07/2017, plain film 08/07/2017 FINDINGS: Cardiomediastinal silhouette unchanged in size and contour. Surgical changes of median sternotomy and CABG. No pneumothorax. Reticular opacity of the left lower lobe with thickening of the pleura compatible with known fibrothorax. Right lung clear. No interlobular septal thickening or new confluent airspace disease. 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 Median sternotomy and CABG. Electronically Signed   By: Corrie Mckusick D.O.   On: 02/11/2019 14:19    Procedures Procedures (including critical care time)  Medications Ordered in ED Medications  aspirin chewable tablet 324 mg (324 mg Oral Given 02/11/19 1521)  HYDROmorphone (DILAUDID) injection 0.5 mg (0.5 mg Intravenous Given 02/11/19 1730)    ED Course  I have reviewed the triage vital signs and the nursing notes.  Pertinent labs & imaging results that were available during my care of the patient were reviewed by me and considered in my medical decision making (see chart for details).  Clinical Course as of Feb 11 1811  Sat Feb 11, 7072  7931 73 year old male presents with complaint of left-sided facial pain for the past 2 days.  Today patient realized last time he had pain on the left side of his jaw and was due to heart problems and resulted in three-vessel CABG in 1999.  Also history of stents.  Patient's cardiologist is through [facility].  Patient states that today he did have a brief episode of chest tightness from about 12:00 to 1:00 with no associated symptoms.  Pain had resolved upon arrival in the emergency room. EKG shows sinus rhythm with LVH with secondary repull  abnormalities with ST changes, no prior EKGs in the system for comparison.  Patient's initial troponin returns and is elevated at 24, serial troponin II hours later has increased to 47.  CBC is unremarkable, BMP with hyperglycemia with a blood glucose of 341. Patient was given 3  and 24 mg of aspirin, had not taken his regular daily aspirin or any of his home meds prior to arrival. Chest x-ray with chronic changes. Patient remained pain-free while in the emergency room, states he does have pain in his mouth, question if this is related to developing dental abscess as he also has decay around the left upper canine with mild swelling and tenderness of the left maxillary area. Case discussed with Dr. Hyacinth Meeker, ER attending, will consult cardiology.   [LM]  1811 Care signed out to Dr. Hyacinth Meeker awaiting consult to cardiology.    [LM]    Clinical Course User Index [LM] Alden Hipp   MDM Rules/Calculators/A&P HEAR Score: 5   CHA2DS2/VAS Stroke Risk Points      N/A >= 2 Points: High Risk  1 - 1.99 Points: Medium Risk  0 Points: Low Risk    A final score could not be computed because of missing components.: Last  Change: N/A     This score determines the patient's risk of having a stroke if the  patient has atrial fibrillation.      This score is not applicable to this patient. Components are not  calculated.                   Final Clinical Impression(s) / ED Diagnoses Final diagnoses:  Chest pain, unspecified type  Elevated troponin  Facial swelling    Rx / DC Orders ED Discharge Orders    None       Alden Hipp 02/11/19 Don Broach, MD 02/11/19 2159

## 2019-02-11 NOTE — ED Triage Notes (Addendum)
Pt reports left side facial pain since Thursday. Denies injury. Pt has been using warm compresses with some relief. Also reports similar pain when he had bypass surgery in 1999. Denies chest pain/SOB at present

## 2019-02-11 NOTE — Progress Notes (Signed)
ANTICOAGULATION CONSULT NOTE - Initial Consult  Pharmacy Consult for Heparin  Indication: chest pain/ACS  Allergies  Allergen Reactions  . Morphine And Related Other (See Comments)    "hallucinations"  . Statins     Patient Measurements: Height: 6' (182.9 cm) Weight: 185 lb (83.9 kg) IBW/kg (Calculated) : 77.6 Heparin Dosing Weight: 83.9 kg  Vital Signs: Temp: 99.3 F (37.4 C) (12/12 1342) Temp Source: Oral (12/12 1342) BP: 159/81 (12/12 1700) Pulse Rate: 83 (12/12 1700)  Labs: Recent Labs    02/11/19 1420 02/11/19 1622  HGB 14.7  --   HCT 42.6  --   PLT 183  --   CREATININE 1.07  --   TROPONINIHS 24* 47*    Estimated Creatinine Clearance: 68.5 mL/min (by C-G formula based on SCr of 1.07 mg/dL).   Medical History: Past Medical History:  Diagnosis Date  . Coronary artery disease   . Diabetes mellitus without complication ([place])   . Hypertension     Medications:  Scheduled:  . heparin  4,000 Units Intravenous Once  . nitroGLYCERIN  0.5 inch Topical Once   Infusions:  . heparin      Assessment: Patient is a 89 yom that presented to the ED with Jaw and chest pain. After further evaluation patient was found to have an elevated trop and an increased repeat trop. At this time pharmacy has been asked to dose heparin in this patient.   Goal of Therapy:  Heparin level 0.3-0.7 units/ml Monitor platelets by anticoagulation protocol: Yes   Plan:  - Heparin bolus of 4000 units IV x 1 dose  - Heparin Drip @ 1000 units/hr  - Heparin level in 6 hours  - Monitor patient for s/s of bleeding and CBC daily while on heparin.   Duanne Limerick PharmD. BCPS  02/11/2019,6:27 PM

## 2019-02-12 ENCOUNTER — Inpatient Hospital Stay (HOSPITAL_COMMUNITY): Payer: Medicare HMO

## 2019-02-12 ENCOUNTER — Inpatient Hospital Stay (HOSPITAL_COMMUNITY): Payer: Self-pay

## 2019-02-12 DIAGNOSIS — I67841 Reversible cerebrovascular vasoconstriction syndrome: Secondary | ICD-10-CM | POA: Diagnosis not present

## 2019-02-12 DIAGNOSIS — R7989 Other specified abnormal findings of blood chemistry: Secondary | ICD-10-CM | POA: Diagnosis not present

## 2019-02-12 DIAGNOSIS — R079 Chest pain, unspecified: Secondary | ICD-10-CM

## 2019-02-12 DIAGNOSIS — Z20828 Contact with and (suspected) exposure to other viral communicable diseases: Secondary | ICD-10-CM | POA: Diagnosis not present

## 2019-02-12 DIAGNOSIS — R6884 Jaw pain: Principal | ICD-10-CM

## 2019-02-12 DIAGNOSIS — R778 Other specified abnormalities of plasma proteins: Secondary | ICD-10-CM

## 2019-02-12 LAB — CBC WITH DIFFERENTIAL/PLATELET
Abs Immature Granulocytes: 0.01 10*3/uL (ref 0.00–0.07)
Basophils Absolute: 0 10*3/uL (ref 0.0–0.1)
Basophils Relative: 1 %
Eosinophils Absolute: 0 10*3/uL (ref 0.0–0.5)
Eosinophils Relative: 1 %
HCT: 40.9 % (ref 39.0–52.0)
Hemoglobin: 14.2 g/dL (ref 13.0–17.0)
Immature Granulocytes: 0 %
Lymphocytes Relative: 25 %
Lymphs Abs: 1.2 10*3/uL (ref 0.7–4.0)
MCH: 32.1 pg (ref 26.0–34.0)
MCHC: 34.7 g/dL (ref 30.0–36.0)
MCV: 92.3 fL (ref 80.0–100.0)
Monocytes Absolute: 0.7 10*3/uL (ref 0.1–1.0)
Monocytes Relative: 14 %
Neutro Abs: 2.9 10*3/uL (ref 1.7–7.7)
Neutrophils Relative %: 59 %
Platelets: 176 10*3/uL (ref 150–400)
RBC: 4.43 MIL/uL (ref 4.22–5.81)
RDW: 12.6 % (ref 11.5–15.5)
WBC: 4.9 10*3/uL (ref 4.0–10.5)
nRBC: 0 % (ref 0.0–0.2)

## 2019-02-12 LAB — BASIC METABOLIC PANEL
Anion gap: 11 (ref 5–15)
BUN: 11 mg/dL (ref 8–23)
CO2: 26 mmol/L (ref 22–32)
Calcium: 9.1 mg/dL (ref 8.9–10.3)
Chloride: 100 mmol/L (ref 98–111)
Creatinine, Ser: 0.93 mg/dL (ref 0.61–1.24)
GFR calc Af Amer: >60 mL/min (ref >60)
GFR calc non Af Amer: >60 mL/min (ref >60)
Glucose, Bld: 215 mg/dL — ABNORMAL HIGH (ref 70–99)
Potassium: 4.4 mmol/L (ref 3.5–5.1)
Sodium: 137 mmol/L (ref 135–145)

## 2019-02-12 LAB — HEMOGLOBIN A1C
Hgb A1c MFr Bld: 9.4 % — ABNORMAL HIGH (ref 4.8–5.6)
Mean Plasma Glucose: 223.08 mg/dL

## 2019-02-12 LAB — HEPARIN LEVEL (UNFRACTIONATED): Heparin Unfractionated: 0.44 IU/mL (ref 0.30–0.70)

## 2019-02-12 LAB — SARS CORONAVIRUS 2 (TAT 6-24 HRS): SARS Coronavirus 2: NEGATIVE

## 2019-02-12 LAB — GLUCOSE, CAPILLARY
Glucose-Capillary: 157 mg/dL — ABNORMAL HIGH (ref 70–99)
Glucose-Capillary: 216 mg/dL — ABNORMAL HIGH (ref 70–99)

## 2019-02-12 LAB — TROPONIN I (HIGH SENSITIVITY)
Troponin I (High Sensitivity): 47 ng/L — ABNORMAL HIGH (ref <18)
Troponin I (High Sensitivity): 30 ng/L — ABNORMAL HIGH (ref <18)

## 2019-02-12 MED ORDER — GABAPENTIN 100 MG PO CAPS
100.0000 mg | ORAL_CAPSULE | Freq: Three times a day (TID) | ORAL | Status: DC
  Administered 2019-02-12: 100 mg via ORAL
  Filled 2019-02-12: qty 1

## 2019-02-12 MED ORDER — ONDANSETRON HCL 4 MG/2ML IJ SOLN: 4.0000 mg | Freq: Four times a day (QID) | INTRAMUSCULAR | Status: DC | PRN

## 2019-02-12 MED ORDER — ASPIRIN EC 81 MG PO TBEC
81.0000 mg | DELAYED_RELEASE_TABLET | Freq: Every day | ORAL | Status: DC
  Filled 2019-02-12: qty 1

## 2019-02-12 MED ORDER — ASPIRIN 81 MG PO TBEC: 81.0000 mg | DELAYED_RELEASE_TABLET | Freq: Every day | ORAL | 2 refills | Status: AC

## 2019-02-12 MED ORDER — ASPIRIN 300 MG RE SUPP: 300.0000 mg | RECTAL | Status: DC

## 2019-02-12 MED ORDER — INSULIN GLARGINE 100 UNIT/ML ~~LOC~~ SOLN
15.0000 [IU] | Freq: Once | SUBCUTANEOUS | Status: DC
  Filled 2019-02-12: qty 0.15

## 2019-02-12 MED ORDER — REGADENOSON 0.4 MG/5ML IV SOLN
INTRAVENOUS | Status: AC
  Filled 2019-02-12: qty 5

## 2019-02-12 MED ORDER — ACETAMINOPHEN 325 MG PO TABS: 325.0000 mg | ORAL_TABLET | ORAL | Status: DC | PRN

## 2019-02-12 MED ORDER — NITROGLYCERIN 0.4 MG SL SUBL: 0.4000 mg | SUBLINGUAL_TABLET | SUBLINGUAL | Status: DC | PRN

## 2019-02-12 MED ORDER — ASPIRIN 81 MG PO CHEW: 324.0000 mg | CHEWABLE_TABLET | ORAL | Status: DC

## 2019-02-12 MED ORDER — ATENOLOL 25 MG PO TABS
25.0000 mg | ORAL_TABLET | Freq: Every day | ORAL | Status: DC
  Administered 2019-02-12: 09:00:00 25 mg via ORAL
  Filled 2019-02-12: qty 1

## 2019-02-12 MED ORDER — NITROGLYCERIN 0.4 MG SL SUBL: 0.4000 mg | SUBLINGUAL_TABLET | SUBLINGUAL | 2 refills | Status: AC | PRN

## 2019-02-12 MED ORDER — ATENOLOL 50 MG PO TABS: 50.0000 mg | ORAL_TABLET | Freq: Every day | ORAL | 2 refills | Status: AC

## 2019-02-12 MED ORDER — KETOROLAC TROMETHAMINE 15 MG/ML IJ SOLN
15.0000 mg | Freq: Once | INTRAMUSCULAR | Status: AC
  Administered 2019-02-12: 15 mg via INTRAVENOUS
  Filled 2019-02-12: qty 1

## 2019-02-12 MED ORDER — ACETAMINOPHEN 325 MG PO TABS: 650.0000 mg | ORAL_TABLET | ORAL | 2 refills | Status: AC | PRN

## 2019-02-12 MED ORDER — INSULIN ASPART 100 UNIT/ML ~~LOC~~ SOLN
10.0000 [IU] | Freq: Two times a day (BID) | SUBCUTANEOUS | Status: DC
  Administered 2019-02-12: 14:00:00 10 [IU] via SUBCUTANEOUS

## 2019-02-12 MED ORDER — REGADENOSON 0.4 MG/5ML IV SOLN
0.4000 mg | Freq: Once | INTRAVENOUS | Status: AC
  Administered 2019-02-12: 11:00:00 0.4 mg via INTRAVENOUS

## 2019-02-12 MED ORDER — TECHNETIUM TC 99M TETROFOSMIN IV KIT
10.0000 | PACK | Freq: Once | INTRAVENOUS | Status: AC | PRN
  Administered 2019-02-12: 10 via INTRAVENOUS

## 2019-02-12 MED ORDER — ACETAMINOPHEN 325 MG PO TABS
650.0000 mg | ORAL_TABLET | ORAL | Status: DC | PRN
  Administered 2019-02-12 (×2): 650 mg via ORAL
  Filled 2019-02-12 (×2): qty 2

## 2019-02-12 NOTE — Care Management Obs Status (Signed)
[facility] NOTIFICATION   Patient Details  Name: Darrell Kane MRN: [id number] Date of Birth: September 07, 1945   Medicare Observation Status Notification Given:  Yes    Claudie Leach, RN 02/12/2019, 4:26 PM

## 2019-02-12 NOTE — Discharge Instructions (Signed)
Chest Wall Pain Chest wall pain is pain in or around the bones and muscles of your chest. Chest wall pain may be caused by:  An injury.  Coughing a lot.  Using your chest and arm muscles too much. Sometimes, the cause may not be known. This pain may take a few weeks or longer to get better. Follow these instructions at home: Managing pain, stiffness, and swelling If told, put ice on the painful area:  Put ice in a plastic bag.  Place a towel between your skin and the bag.  Leave the ice on for 20 minutes, 2-3 times a day.  Activity  Rest as told by your doctor.  Avoid doing things that cause pain. This includes lifting heavy items.  Ask your doctor what activities are safe for you. General instructions   Take over-the-counter and prescription medicines only as told by your doctor.  Do not use any products that contain nicotine or tobacco, such as cigarettes, e-cigarettes, and chewing tobacco. If you need help quitting, ask your doctor.  Keep all follow-up visits as told by your doctor. This is important. Contact a doctor if:  You have a fever.  Your chest pain gets worse.  You have new symptoms. Get help right away if:  You feel sick to your stomach (nauseous) or you throw up (vomit).  You feel sweaty or light-headed.  You have a cough with mucus from your lungs (sputum) or you cough up blood.  You are short of breath. These symptoms may be an emergency. Do not wait to see if the symptoms will go away. Get medical help right away. Call your local emergency services (911 in the [place] Do not drive yourself to the hospital. Summary  Chest wall pain is pain in or around the bones and muscles of your chest.  It may be treated with ice, rest, and medicines. Your condition may also get better if you avoid doing things that cause pain.  Contact a doctor if you have a fever, chest pain that gets worse, or new symptoms.  Get help right away if you feel light-headed  or you get short of breath. These symptoms may be an emergency. This information is not intended to replace advice given to you by your health care provider. Make sure you discuss any questions you have with your health care provider. Document Released: 08/05/2007 Document Revised: 08/19/2017 Document Reviewed: 08/19/2017 Elsevier Patient Education  2020 [employer]   Angina  Angina is very bad discomfort or pain in the chest, neck, arm, jaw, or back. The discomfort is caused by a lack of blood in the middle layer of the heart wall (myocardium). What are the causes? This condition is caused by a buildup of fat and cholesterol (plaque) in your arteries (atherosclerosis). This buildup narrows the arteries and makes it hard for blood to flow. What increases the risk? You are more likely to develop this condition if:  You have high levels of cholesterol in your blood.  You have high blood pressure (hypertension).  You have diabetes.  You have a family history of heart disease.  You are not active, or you do not exercise enough.  You feel sad (depressed).  You have been treated with high energy rays (radiation) on the left side of your chest. Other risk factors are:  Using tobacco.  Being very overweight (obese).  Eating a diet high in unhealthy fats (saturated fats).  Having stress, or being exposed to things that cause stress.  Using drugs, such as cocaine. Women have a greater risk for angina if:  They are older than 26.  They have stopped having their period (are in postmenopause). What are the signs or symptoms? Common symptoms of this condition in both men and women may include:  Chest pain, which may: ? Feel like a crushing or squeezing in the chest. ? Feel like a tightness, pressure, fullness, or heaviness in the chest. ? Last for more than a few minutes at a time. ? Stop and come back (recur) after a few minutes.  Pain in the neck, arm, jaw, or  back.  Heartburn or upset stomach (indigestion) for no reason.  Being short of breath.  Feeling sick to your stomach (nauseous).  Sudden cold sweats. Women and people with diabetes may have other symptoms that are not usual, such as feeling:  Tired (fatigue).  Worried or nervous (anxious) for no reason.  Weak for no reason.  Dizzy or passing out (fainting). How is this treated? This condition may be treated with:  Medicines. These are given to: ? Prevent blood clots. ? Prevent heart attack. ? Relax blood vessels and improve blood flow to the heart (nitrates). ? Reduce blood pressure. ? Improve the pumping action of the heart. ? Reduce fat and cholesterol in the blood.  A procedure to widen a narrowed or blocked artery in the heart (angioplasty).  Surgery to allow blood to go around a blocked artery (coronary artery bypass surgery). Follow these instructions at home: Medicines  Take over-the-counter and prescription medicines only as told by your doctor.  Do not take these medicines unless your doctor says that you can: ? NSAIDs. These include:  Ibuprofen.  Naproxen. ? Vitamin supplements that have vitamin A, vitamin E, or both. ? Hormone therapy that contains estrogen with or without progestin. Eating and drinking   Eat a heart-healthy diet that includes: ? Lots of fresh fruits and vegetables. ? Whole grains. ? Low-fat (lean) protein. ? Low-fat dairy products.  Follow instructions from your doctor about what you cannot eat or drink. Activity  Follow an exercise program that your doctor tells you.  Talk with your doctor about joining a program to help improve the health of your heart (cardiac rehab).  When you feel tired, take a break. Plan breaks if you know you are going to feel tired. Lifestyle   Do not use any products that contain nicotine or tobacco. This includes cigarettes, e-cigarettes, and chewing tobacco. If you need help quitting, ask your  doctor.  If your doctor says you can drink alcohol: ? Limit how much you use to:  0-1 drink a day for women who are not pregnant.  0-2 drinks a day for men. ? Be aware of how much alcohol is in your drink. In the [place] one drink equals:  One 12 oz bottle of beer (355 mL).  One 5 oz glass of wine (148 mL).  One 1 oz glass of hard liquor (44 mL). General instructions  Stay at a healthy weight. If your doctor tells you to do so, work with him or her to lose weight.  Learn to deal with stress. If you need help, ask your doctor.  Keep your vaccines up to date. Get a flu shot every year.  Talk with your doctor if you feel sad. Take a screening test to see if you are at risk for depression.  Work with your doctor to manage any other health problems that you have. These may  include diabetes or high blood pressure.  Keep all follow-up visits as told by your doctor. This is important. Get help right away if:  You have pain in your chest, neck, arm, jaw, or back, and the pain: ? Lasts more than a few minutes. ? Comes back. ? Does not get better after you take medicine under your tongue (sublingual nitroglycerin). ? Keeps getting worse. ? Comes more often.  You have any of these problems for no reason: ? Sweating a lot. ? Heartburn or upset stomach. ? Shortness of breath. ? Trouble breathing. ? Feeling sick to your stomach. ? Throwing up (vomiting). ? Feeling more tired than normal. ? Feeling nervous or worrying more than normal. ? Weakness.  You are suddenly dizzy or light-headed.  You pass out. These symptoms may be an emergency. Do not wait to see if the symptoms will go away. Get medical help right away. Call your local emergency services (911 in the [place] Do not drive yourself to the hospital. Summary  Angina is very bad discomfort or pain in the chest, neck, arm, neck, or back.  Symptoms include chest pain, heartburn or upset stomach for no reason, and shortness of  breath.  Women or people with diabetes may have symptoms that are not usual, such as feeling nervous or worried for no reason, weak for no reason, or tired.  Take all medicines only as told by your doctor.  You should eat a heart-healthy diet and follow an exercise program. This information is not intended to replace advice given to you by your health care provider. Make sure you discuss any questions you have with your health care provider. Document Released: 08/05/2007 Document Revised: 10/04/2017 Document Reviewed: 10/04/2017 Elsevier Patient Education  2020 [employer]

## 2019-02-12 NOTE — Progress Notes (Signed)
Reviewed EKG 1416 hrs. Localized ST elevation in V2 relatively unchanged compared to prior EKG's. Likely due to LVH. No chest pain at this time. Jaw pain, which has been constant and persistent, improved with tordol. Not STEMI. Awaiting stress test results.

## 2019-02-12 NOTE — Care Management CC44 (Signed)
Condition Code 44 Documentation Completed  Patient Details  Name: Armstead Heiland MRN: [id number] Date of Birth: Mar 13, 1945   Condition Code 44 given:  Yes Patient signature on Condition Code 44 notice:  Yes Documentation of 2 MD's agreement:  Yes Code 44 added to claim:  Yes    Claudie Leach, RN 02/12/2019, 4:26 PM

## 2019-02-12 NOTE — Progress Notes (Signed)
Nuclear stress test 02/12/2019: 1. Large reversible perfusion defect, mild severity, inferolateral and inferior wall from apex to base, as well as a second smaller mild reversible perfusion defect of the septal wall mid ventricle to base. 2. Paradoxical septal wall motion. 3. Left ventricular ejection fraction 36% 4. Non invasive risk stratification*: High  Results for [name], [name] (MRN [id number]) as of 02/12/2019 16:01  Ref. Range 02/11/2019 14:20 02/11/2019 16:22 02/12/2019 01:49 02/12/2019 15:00  Troponin I (High Sensitivity) Latest Ref Range: <18 ng/L 24 (H) 47 (H) 47 (H) 30 (H)   Discussed above complaints of stress test and appointment with the patient.  The test results in context, patient has had reproducible jaw tenderness improved with Toradol.  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  Ischemia is predominantly inferior and inferolateral, not anterior.  I remain unconvinced that all of his jaw pain is angina.  He is not having any ongoing chest pain, and has minimally elevated troponin that is now normalizing.  He is quite hesitant to stay in the hospital for further work-up, including possible coronary and bypass graft angiography and revascularization.  Patient is hemodynamically and electrically stable.  We have mutually decided to discharge the patient.  Form will be to have soon, preferably in the coming week.  I will see the patient back for close transition of care follow-up before Christmas.  In the meantime, I recommend aggressive medical management with aspirin, statin, increase dose of atenolol, and as needed symptom nitroglycerin. Patient knows to contact me immediately, should he have any worsening symptoms.  He also knows to contact 911 if severe chest pain, not improved with nitroglycerin, occurs. Nigel Mormon, MD [facility] [place] Pager: [phone] Office: [phone]

## 2019-02-12 NOTE — Discharge Summary (Signed)
Same day discharge summary. Please refer to H&P note from 02/12/2019  Nuclear stress test 02/12/2019: 1. Large reversible perfusion defect, mild severity, inferolateral and inferior wall from apex to base, as well as a second smaller mild reversible perfusion defect of the septal wall mid ventricle to base. 2. Paradoxical septal wall motion. 3. Left ventricular ejection fraction 36% 4. Non invasive risk stratification*: High  Results for Darrell Kane, Darrell Kane (MRN [id number]) as of 02/12/2019 16:01  Ref. Range 02/11/2019 14:20 02/11/2019 16:22 02/12/2019 01:49 02/12/2019 15:00  Troponin I (High Sensitivity) Latest Ref Range: <18 ng/L 24 (H) 47 (H) 47 (H) 30 (H)   Discussed above complaints of stress test and appointment with the patient.  The test results in context, patient has had reproducible jaw tenderness improved with Toradol.  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  Ischemia is predominantly inferior and inferolateral, not anterior.  I remain unconvinced that all of his jaw pain is angina.  He is not having any ongoing chest pain, and has minimally elevated troponin that is now normalizing.  He is quite hesitant to stay in the hospital for further work-up, including possible coronary and bypass graft angiography and revascularization.  Patient is hemodynamically and electrically stable.  We have mutually decided to discharge the patient.  Form will be to have soon, preferably in the coming week.  I will see the patient back for close transition of care follow-up before Christmas.  In the meantime, I recommend aggressive medical management with aspirin, statin, increase dose of atenolol, and as needed symptom nitroglycerin. Patient knows to contact me immediately, should he have any worsening symptoms.  He also knows to contact 911 if severe chest pain, not improved with nitroglycerin, occurs.  Nigel Mormon, MD [facility] [place] Pager:  [phone] Office: [phone]

## 2019-02-13 DIAGNOSIS — R079 Chest pain, unspecified: Secondary | ICD-10-CM

## 2019-02-13 DIAGNOSIS — R778 Other specified abnormalities of plasma proteins: Secondary | ICD-10-CM

## 2019-02-13 DIAGNOSIS — R6884 Jaw pain: Secondary | ICD-10-CM | POA: Diagnosis present

## 2019-02-15 ENCOUNTER — Telehealth: Payer: Self-pay

## 2019-02-15 NOTE — Telephone Encounter (Signed)
Location of hospitalization: [facility] Reason for hospitalization: Chest Pain Date of discharge: 02/12/19 Date of first communication with patient: today Person contacting patient: Me Current symptoms: NA Do you understand why you were in the Hospital: Yes Questions regarding discharge instructions: None Where were you discharged to: Home Medications reviewed: Yes Allergies reviewed: Yes Dietary changes reviewed: Yes. Discussed low fat and low salt diet.  Referals reviewed: NA Activities of Daily Living: Able to with mild limitations Any transportation issues/concerns: None Any patient concerns: None Confirmed importance & date/time of Follow up appt: Yes Confirmed with patient if condition begins to worsen call. Pt was given the office number and encouraged to call back with questions or concerns: Yes

## 2019-02-16 NOTE — Progress Notes (Signed)
Subjective:   Darrell Kane, male    DOB: 01/07/1946, 73 y.o.   MRN: [id number]   HPI  73 y.o. Caucasian male  with hypertension, uncontrolled type 2 DM, CAD s/p h/o CABG, PAD, with jaw/facial pain.  Patient was transferred to [facility] on 02/12/2019 from [facility], after he presented there with complaints of jaw pain.  Given his cardiac history and mild elevated troponin, he underwent stress test that did show large reversible perfusion defect of mild severity in the inferior and inferolateral echocardiogram, probably extubates, along with a second smaller mild reversible perfusion defect of the septal wall.  EF was reduced at 36%.   His EKG on 12/14 did show J point elevation in V1 and V2, that did not meet STEMI criteria. Given that he was chest pain free with flat troponin, STEMI was not activated. Of note, these changes had been seen on prior EKG during the hospital stay, and were felt to be related to hypertension. I discussed the stress test results with the patient.  He remained chest pain-free on ambulation, without any hemodynamic or electrical instability.  Given that his primary complaints were that of constant jaw pain, we felt that that should be addressed first, with close follow-up to address his baseline ischemia and coronary artery disease.   Patient is here for transitional care visit today.  Since hospital discharge, he saw his dentist the next morning.  He was found to have a growing abscess behind his left upper molars.  He was given the option of root canal versus tooth extraction.  He will opted for the latter, which is scheduled for 02/22/2019.  He is currently taking antibiotics for the same.  His pain has remarkably improved.  He occasionally takes Tylenol for the pain at this time.  On further history, patient states that while this pain reminded him of his symptoms prior to his CABG, he did have clear chest tightness along with jaw pain before his  CABG.  He does have chest tightness on occasions when he exerts himself more than usual.  I discussed stress test findings again with the patient.  See below for management plan.  Patient sees PCP Dr. Drake Leach, last saw him 3 months ago.  His A1c was 7.6 at that time, is now increased to 9.4%.    Current Outpatient Medications on File Prior to Visit  Medication Sig Dispense Refill  . acetaminophen (TYLENOL) 325 MG tablet Take 2 tablets (650 mg total) by mouth every 4 (four) hours as needed for headache or mild pain. 60 tablet 2  . allopurinol (ZYLOPRIM) 300 MG tablet Take 300 mg by mouth daily.    Marland Kitchen ascorbic acid (VITAMIN C) 1000 MG tablet Take 1,000 mg by mouth daily.     Marland Kitchen aspirin EC 81 MG EC tablet Take 1 tablet (81 mg total) by mouth daily. 90 tablet 2  . atenolol (TENORMIN) 50 MG tablet Take 1 tablet (50 mg total) by mouth daily. 90 tablet 2  . cilostazol (PLETAL) 100 MG tablet Take 100 mg by mouth 2 (two) times daily.    . cyanocobalamin 100 MCG tablet Take 100 mcg by mouth daily.     Marland Kitchen gabapentin (NEURONTIN) 100 MG capsule Take 400 mg by mouth daily.     . insulin aspart (NOVOLOG) 100 UNIT/ML injection Inject 12-14 Units into the skin 3 (three) times daily before meals.    . insulin aspart protamine - aspart (NOVOLOG 70/30 MIX) (  70-30) 100 UNIT/ML FlexPen Inject 14-16 Units into the skin 3 (three) times daily before meals.     . Insulin Glargine, 1 Unit Dial, (TOUJEO SOLOSTAR) 300 UNIT/ML SOPN Inject 35 Units into the skin daily.     Marland Kitchen losartan (COZAAR) 50 MG tablet Take 50 mg by mouth daily.    . Multiple Vitamin (MULTI-VITAMIN) tablet Take 1 tablet by mouth daily.     . nitroGLYCERIN (NITROSTAT) 0.4 MG SL tablet Place 1 tablet (0.4 mg total) under the tongue every 5 (five) minutes x 3 doses as needed for chest pain. 30 tablet 2  . NOVOLOG 100 UNIT/ML injection     . omeprazole (PRILOSEC) 20 MG capsule Take 20 mg by mouth.     . potassium citrate (UROCIT-K) 10 MEQ (1080 MG) SR  tablet Take 10 mEq by mouth 3 (three) times daily.    . rosuvastatin (CRESTOR) 40 MG tablet Take 40 mg by mouth daily.    . tamsulosin (FLOMAX) 0.4 MG CAPS capsule Take 0.4 mg by mouth at bedtime.     . vitamin E 400 UNIT capsule Take 400 Units by mouth daily.     . ciclopirox (PENLAC) 8 % solution APPLY TOPICALLY NIGHTLY OVER A PREVIOUS COAT. AFTER SEVEN DAYS, MAY REMOVE WITH ALCOHOL AND CONTINUE THE CYCLE    . latanoprost (XALATAN) 0.005 % ophthalmic solution Place 1 drop into the left eye at bedtime.      No current facility-administered medications on file prior to visit.    Cardiovascular & other pertient studies:  EKG 02/17/2019: Sinus rhythm 64 bpm. Left ventricular hypertrophy. ST elevation in V&V2 not meeting STEMI criteria. Likely due to LVH. No reciprocal changes. No changes compared to previous EKG on 02/12/2019.  EKG 02/11/2019: Sinus rhythm 98 bpm. LVH. Inferolateral ST depression, cannot exclude ischemia.   ABI 04/2018: (R ABI 0.48, L ABI 1.22-artificially elevated  Carotid duplex 04/2018: R 21-39%, L 40-59% stenosis   Recent labs: 02/12/2019: Glucose 215, BUN/Cr 11/0.93. EGFR >60. Na/K 137/4.4.  H/H 14/41. MCV 92. Platelets 176 HbA1C 9.4%  Results for JATERRIUS, RICKETSON (MRN [id number]) as of 02/17/2019 13:01  Ref. Range 02/11/2019 14:20 02/11/2019 16:22 02/12/2019 01:49 02/12/2019 15:00  Troponin I (High Sensitivity) Latest Ref Range: <18 ng/L 24 (H) 47 (H) 47 (H) 30 (H)     Review of Systems  Constitution: Negative for decreased appetite, malaise/fatigue, weight gain and weight loss.  HENT: Negative for congestion.        Toothache   Eyes: Negative for visual disturbance.  Cardiovascular: Positive for chest pain (As per HPI). Negative for dyspnea on exertion, leg swelling, palpitations and syncope.  Respiratory: Negative for cough.   Endocrine: Negative for cold intolerance.  Hematologic/Lymphatic: Does not bruise/bleed easily.  Skin: Negative for  itching and rash.  Musculoskeletal: Negative for myalgias.  Gastrointestinal: Negative for abdominal pain, nausea and vomiting.  Genitourinary: Negative for dysuria.  Neurological: Negative for dizziness and weakness.  Psychiatric/Behavioral: The patient is not nervous/anxious.   All other systems reviewed and are negative.        Vitals:   02/17/19 1249 02/17/19 1256  BP: (!) 146/60 (!) 123/57  Pulse: 64 63  Temp: 97.6 F (36.4 C)   SpO2: 97%     Body mass index is 25.86 kg/m. Filed Weights   02/17/19 1249  Weight: 190 lb 11.2 oz (86.5 kg)     Objective:   Physical Exam  Constitutional: He is oriented to person, place, and time. He appears well-developed  and well-nourished. No distress.  HENT:  Head: Normocephalic and atraumatic.  Eyes: Pupils are equal, round, and reactive to light. Conjunctivae are normal.  Neck: No JVD present.  Cardiovascular: Normal rate and regular rhythm.  No murmur heard. Pulses:      Dorsalis pedis pulses are 0 on the right side and 0 on the left side.       Posterior tibial pulses are 0 on the right side and 0 on the left side.  Pulmonary/Chest: Effort normal and breath sounds normal. He has no wheezes. He has no rales.  Abdominal: Soft. Bowel sounds are normal. There is no rebound.  Musculoskeletal:        General: No edema.  Lymphadenopathy:    He has no cervical adenopathy.  Neurological: He is alert and oriented to person, place, and time. No cranial nerve deficit.  Skin: Skin is warm and dry.  Psychiatric: He has a normal mood and affect.  Nursing note and vitals reviewed.         Assessment & Recommendations:   73 y.o. Caucasian male  with hypertension, uncontrolled type 2 DM, CAD s/p h/o CABG, PAD,  Jaw pain: This clearly is related to his tooth infection.  He has a tooth extraction scheduled on 02/22/2019.  I think he can safely be done under local anesthesia.  If he requires any treatment under general anesthesia, I  would recommend delaying it until further cardiac work-up is complete.  CAD with stable angina: Prior CABG.  He clearly has exertional angina symptoms.  Recent stress test showed severe ischemia, likely in LCx/RCA territory.  Stress EF is reduced at 36%.  Resting EKG shows old anteroseptal infarct, but ST elevation in V1 and V2, not meeting STEMI criteria, is likely due to left ventricular hypertrophy.    In addition to optimal medical therapy, I strongly recommend coronary and bypass graft angiography to assess anatomy and discuss revascularization.  Patient is further reluctant to proceed with coronary bypass graft angiography at this time. He is very anxious to get his tooth extracted first.  Be initially decided to meet again in 2-3 weeks after his tooth extraction, obtain an echocardiogram to assess cardiac function, and further discuss medical and invasive management options.  Continue aspirin, statin, atenolol.  Continue aggressive risk factor modification, as below.  Hypertension: Controlled on atenolol and losartan.  Type 2 DM: Uncontrolled.  Hemoglobin A1c 9.6%.  In addition to insulin, I have started him on Jardiance 10 mg daily, given cardiovascular benefits. Recommend close follow-up with his PCP for diabetes management.    Nigel Mormon, MD [facility] Pager: [phone] Office: [phone]

## 2019-02-17 ENCOUNTER — Encounter: Payer: Self-pay | Admitting: Cardiology

## 2019-02-17 ENCOUNTER — Ambulatory Visit (INDEPENDENT_AMBULATORY_CARE_PROVIDER_SITE_OTHER): Payer: Medicare HMO | Admitting: Cardiology

## 2019-02-17 ENCOUNTER — Other Ambulatory Visit: Payer: Self-pay

## 2019-02-17 VITALS — BP 123/57 | HR 63 | Temp 97.6°F | Ht 72.0 in | Wt 190.7 lb

## 2019-02-17 DIAGNOSIS — I25708 Atherosclerosis of coronary artery bypass graft(s), unspecified, with other forms of angina pectoris: Secondary | ICD-10-CM | POA: Diagnosis not present

## 2019-02-17 DIAGNOSIS — Z794 Long term (current) use of insulin: Secondary | ICD-10-CM | POA: Diagnosis not present

## 2019-02-17 DIAGNOSIS — I214 Non-ST elevation (NSTEMI) myocardial infarction: Secondary | ICD-10-CM | POA: Diagnosis not present

## 2019-02-17 DIAGNOSIS — E118 Type 2 diabetes mellitus with unspecified complications: Secondary | ICD-10-CM | POA: Diagnosis not present

## 2019-02-17 MED ORDER — JARDIANCE 10 MG PO TABS: 10.0000 mg | ORAL_TABLET | Freq: Every day | ORAL | 3 refills | Status: AC

## 2019-03-13 ENCOUNTER — Other Ambulatory Visit: Payer: Medicare HMO

## 2019-03-16 ENCOUNTER — Other Ambulatory Visit: Payer: Medicare HMO

## 2019-03-16 ENCOUNTER — Ambulatory Visit: Payer: Medicare HMO | Admitting: Cardiology

## 2019-03-27 ENCOUNTER — Ambulatory Visit: Payer: Medicare HMO | Admitting: Cardiology

## 2019-03-27 ENCOUNTER — Other Ambulatory Visit: Payer: Medicare HMO

## 2019-05-23 MED ORDER — ATORVASTATIN 10 MG TAB
10 mg | ORAL_TABLET | ORAL | 1 refills | Status: DC
Start: 2019-05-23 — End: 2019-11-30

## 2019-07-04 MED ORDER — FLUOXETINE 10 MG CAP
10 mg | ORAL_CAPSULE | Freq: Every day | ORAL | 1 refills | Status: DC
Start: 2019-07-04 — End: 2019-10-18

## 2019-07-04 NOTE — Telephone Encounter (Signed)
D- capsule Fluoxetine 10 mg , #90 , one po daily , 1 refill is ok . Please escribe

## 2019-07-04 NOTE — Telephone Encounter (Signed)
Pt would like to know if the Dr can change his prescription for Fluoxetine 10 mg to a capsule instead of a tablet because it will be cheaper       Pt ph# [phone] (Call Pt when it is done please)    [facility] ph# [phone]

## 2019-08-14 MED ORDER — LISINOPRIL 20 MG TAB
20 mg | ORAL_TABLET | ORAL | 2 refills | Status: DC
Start: 2019-08-14 — End: 2020-05-26

## 2019-09-28 MED ORDER — CARTIA XT 300 MG CAPSULE,EXTENDED RELEASE
300 mg | ORAL_CAPSULE | ORAL | 3 refills | Status: DC
Start: 2019-09-28 — End: 2020-09-30

## 2019-10-17 MED ORDER — FLUOXETINE 10 MG TAB
10 mg | ORAL_TABLET | ORAL | 1 refills | Status: DC
Start: 2019-10-17 — End: 2019-10-18

## 2019-10-18 MED ORDER — FLUOXETINE 10 MG CAP
10 mg | ORAL_CAPSULE | Freq: Every day | ORAL | 1 refills | Status: DC
Start: 2019-10-18 — End: 2021-05-17

## 2019-10-18 NOTE — Telephone Encounter (Signed)
erx Fluoxetine caps

## 2019-10-18 NOTE — Telephone Encounter (Signed)
Patient stated that the medication fluoxetine HCL 10 mg caps. Patient stated that tablets were called in and he wants caps.     Pt ph# [phone]    [facility] ph# [phone]

## 2019-11-08 ENCOUNTER — Encounter: Attending: Internal Medicine | Primary: Internal Medicine

## 2019-11-24 NOTE — Telephone Encounter (Signed)
Pt would like a refill on his      Atorvastatin 10 mg       Ph# [phone]    [facility] Ph# [phone]

## 2019-11-24 NOTE — Telephone Encounter (Signed)
Patient needed an appointment and I scheduled him one for Sept.28th at 4:oopm.SSW

## 2019-11-28 ENCOUNTER — Ambulatory Visit: Admit: 2019-11-28 | Attending: Internal Medicine | Primary: Internal Medicine

## 2019-11-28 ENCOUNTER — Ambulatory Visit: Attending: Internal Medicine | Primary: Internal Medicine

## 2019-11-28 DIAGNOSIS — I10 Essential (primary) hypertension: Secondary | ICD-10-CM

## 2019-11-28 NOTE — Progress Notes (Signed)
Jeffrey Soto is a 74 y.o. Jeffrey Soto/m : toe numbness     SUBJECTIVE:       6 mos intermittent left  Toes numbness   Low back pain earlier this year not now uses Estim prn     Past Medical History:   Diagnosis Date   ??? Arrhythmia     Hx PAT. dr. Ezequiel Ganser evaluated.    ??? Depression    ??? Elevated lipids 12/16/2009   ??? Elevated PSA     Dr. Ceasar Mons    ??? Fatty liver    ??? Gallbladder polyp    ??? GERD (gastroesophageal reflux disease) 06/26/03    EGD: duodenitis , dilation of Esophageal ring , DR. [name]   ??? HTN (hypertension) 12/16/2009   ??? HX OTHER MEDICAL     cubital tunnel,left elbow   ??? Prostate cancer (Jeffrey Soto) 06/02/2017    Low grade by report . 2019 . Dr. Ceasar Mons      Current Outpatient Medications   Medication Sig Dispense Refill   ??? FLUoxetine (PROzac) 10 mg capsule Take 1 Capsule by mouth daily. 90 Capsule 1   ??? Cartia XT 300 mg capsule TAKE ONE CAPSULE BY MOUTH DAILY 90 Capsule 3   ??? lisinopriL (PRINIVIL, ZESTRIL) 20 mg tablet TAKE ONE TABLET BY MOUTH EVERY MORNING 90 Tablet 2   ??? atorvastatin (LIPITOR) 10 mg tablet TAKE ONE TABLET BY MOUTH EVERY EVENING 90 Tab 1   ??? famotidine (PEPCID) 20 mg tablet TAKE ONE TABLET BY MOUTH TWICE A DAY 60 Tab 11   ??? psyllium (METAMUCIL) powd Take  by mouth daily.     ??? meloxicam (MOBIC) 15 mg tablet Take 15 mg by mouth daily as needed for Pain.     ??? diphenhydrAMINE (BENADRYL) 50 mg tablet Take 50 mg by mouth nightly.     ??? diltiazem CD (CARTIA XT) 300 mg ER capsule TAKE ONE CAPSULE BY MOUTH DAILY 90 Cap 3   ??? lansoprazole (PREVACID) 15 mg capsule Take  by mouth Daily (before breakfast).     ??? aspirin delayed-release 81 mg tablet Take  by mouth daily.       No Known Allergies  .  Social History     Tobacco Use   Smoking Status Former Smoker   Smokeless Tobacco Never Used     The patient denies in past year  a history of falls. .  Depression screen neg.     Review of Systems  Review of Systems - negative except as per HPI  S/p 3 R IH repairs occasional rt groin discomfort   golfs  regularly and left thumb base pain at times; responds to sports cream     Physical Examination:  Visit Vitals  BP 139/72   Temp 97.7 ??F (36.5 ??C)   Ht 6' 1" (1.854 m)   Wt 223 lb (101.2 kg)   BMI 29.42 kg/m??     Physical Exam    General: Appears in no acute distress  Lung-clear  Heart - reg  Abdomen -bs+ soft, nt   GU: nl genitalia [place]/o hernia   Left thumb from   Left thumb base and palm nt   Feet no edema   Neurovascular intact       Labs: No results found for this or any previous visit (from the past 8 hour(s)).    Marland Kitchen   Results for orders placed or performed in visit on 11/01/18   LIPID PANEL   Result Value Ref  Range    Cholesterol, total 183 100 - 199 mg/dL    Triglyceride 264 (H) 0 - 149 mg/dL    HDL Cholesterol 33 (L) >39 mg/dL    VLDL, calculated 45 (H) 5 - 40 mg/dL    LDL, calculated 105 (H) 0 - 99 mg/dL    Comment CANCELED    METABOLIC PANEL, COMPREHENSIVE   Result Value Ref Range    Glucose 90 65 - 99 mg/dL    BUN 19 8 - 27 mg/dL    Creatinine 0.87 0.76 - 1.27 mg/dL    GFR est non-AA 86 >59 mL/min/1.73    GFR est AA 100 >59 mL/min/1.73    BUN/Creatinine ratio 22 10 - 24    Sodium 142 134 - 144 mmol/L    Potassium 4.0 3.5 - 5.2 mmol/L    Chloride 105 96 - 106 mmol/L    CO2 21 20 - 29 mmol/L    Calcium 9.6 8.6 - 10.2 mg/dL    Protein, total 7.2 6.0 - 8.5 g/dL    Albumin 4.7 3.7 - 4.7 g/dL    GLOBULIN, TOTAL 2.5 1.5 - 4.5 g/dL    A-G Ratio 1.9 1.2 - 2.2    Bilirubin, total 0.5 0.0 - 1.2 mg/dL    Alk. phosphatase 66 39 - 117 IU/L    AST (SGOT) 24 0 - 40 IU/L    ALT (SGPT) 29 0 - 44 IU/L   AMB POC COMPLETE CBC,AUTOMATED ENTER   Result Value Ref Range    WBC (POC) 8.9 4.5 - 10.5 K/uL    LYMPHOCYTES (POC) 29.0 20.5 - 51.1 %    MONOCYTES (POC)      GRANULOCYTES (POC)      ABS. LYMPHS (POC) 2.0 1.2 - 3.4 K/uL    ABS. MONOS (POC)      ABS. GRANS (POC)      RBC (POC) 5.14 4.00 - 6.00 M/uL    HGB (POC) 14.9 11 - 18 g/dL    HCT (POC) 44.4 35.0 - 60.0 %    MCV (POC) 86.4 80.0 - 99.9 fL    MCH (POC)      MCHC (POC)      RDW  (POC)      PLATELET (POC) 203 150 - 450 K/uL    MPV (POC)          Assessment/Plan    ICD-10-CM ICD-9-CM    1. Essential hypertension  I10 401.9 LIPID PANEL      METABOLIC PANEL, COMPREHENSIVE      COLLECTION VENOUS BLOOD,VENIPUNCTURE      CBC WITH AUTOMATED DIFF      TSH 3RD GENERATION      VITAMIN B12   2. Numbness of toes  R20.0 782.0 LIPID PANEL      METABOLIC PANEL, COMPREHENSIVE      COLLECTION VENOUS BLOOD,VENIPUNCTURE      CBC WITH AUTOMATED DIFF      TSH 3RD GENERATION      VITAMIN B12   3. B12 deficiency  E53.8 266.2 LIPID PANEL      METABOLIC PANEL, COMPREHENSIVE      COLLECTION VENOUS BLOOD,VENIPUNCTURE      CBC WITH AUTOMATED DIFF      TSH 3RD GENERATION      VITAMIN B12   4. Hypercholesterolemia  E78.00 272.0 LIPID PANEL      METABOLIC PANEL, COMPREHENSIVE      COLLECTION VENOUS BLOOD,VENIPUNCTURE      CBC WITH AUTOMATED DIFF  TSH 3RD GENERATION      VITAMIN B12   5. Left hand pain  M79.642 729.5 LIPID PANEL      METABOLIC PANEL, COMPREHENSIVE      COLLECTION VENOUS BLOOD,VENIPUNCTURE      CBC WITH AUTOMATED DIFF      TSH 3RD GENERATION      VITAMIN B12   6. Encounter for long-term (current) use of other medications  Z79.899 V58.69 LIPID PANEL      METABOLIC PANEL, COMPREHENSIVE      COLLECTION VENOUS BLOOD,VENIPUNCTURE      CBC WITH AUTOMATED DIFF      TSH 3RD GENERATION      VITAMIN B12   7. Prostate cancer ([place])  C61 185    8. Right groin pain  R10.31 789.03      HTN well controlled. Same rx   Lipid d/o await results   prob referred numbness from lumbar spine   Check B12 level   Left palm prob mm strain. P; rest, sports cream prn   Prostate ca. Low grade. Follow up with Dr. Davis Gourd   Rt groin minor strain at times. ? Scar tissue ; follow   RV here 3 m for cpe or prn sooner       Author:  Darlina Rumpf, MD [phone] PM9/28/2021

## 2019-11-28 NOTE — Progress Notes (Signed)
Left VM results. Advised wt loss through diet and exercise Lipitor renewed. Follow up in office or prn sooner

## 2019-11-29 LAB — METABOLIC PANEL, COMPREHENSIVE
A-G Ratio: 2.2 (ref 1.2–2.2)
ALT (SGPT): 32 IU/L (ref 0–44)
AST (SGOT): 26 IU/L (ref 0–40)
Albumin: 4.9 g/dL — ABNORMAL HIGH (ref 3.7–4.7)
Alk. phosphatase: 79 IU/L (ref 44–121)
BUN/Creatinine ratio: 19 (ref 10–24)
BUN: 18 mg/dL (ref 8–27)
Bilirubin, total: 0.7 mg/dL (ref 0.0–1.2)
CO2: 23 mmol/L (ref 20–29)
Calcium: 9.6 mg/dL (ref 8.6–10.2)
Chloride: 103 mmol/L (ref 96–106)
Creatinine: 0.95 mg/dL (ref 0.76–1.27)
GFR est AA: 91 mL/min/{1.73_m2} (ref >59)
GFR est non-AA: 79 mL/min/{1.73_m2} (ref >59)
GLOBULIN, TOTAL: 2.2 g/dL (ref 1.5–4.5)
Glucose: 92 mg/dL (ref 65–99)
Potassium: 4.1 mmol/L (ref 3.5–5.2)
Protein, total: 7.1 g/dL (ref 6.0–8.5)
Sodium: 140 mmol/L (ref 134–144)

## 2019-11-29 LAB — LIPID PANEL
Cholesterol, Total: 167 mg/dL (ref 100–199)
Cholesterol, total: 167 mg/dL (ref 100–199)
HDL Cholesterol: 33 mg/dL — ABNORMAL LOW (ref >39)
HDL: 33 mg/dL — ABNORMAL LOW (ref >39)
LDL Calculated: 92 mg/dL (ref 0–99)
LDL, calculated: 92 mg/dL (ref 0–99)
Triglyceride: 249 mg/dL — ABNORMAL HIGH (ref 0–149)
Triglycerides: 249 mg/dL — ABNORMAL HIGH (ref 0–149)
VLDL, calculated: 42 mg/dL — ABNORMAL HIGH (ref 5–40)
VLDL: 42 mg/dL — ABNORMAL HIGH (ref 5–40)

## 2019-11-29 LAB — CBC WITH AUTOMATED DIFF
ABS. BASOPHILS: 0.1 10*3/uL (ref 0.0–0.2)
ABS. EOSINOPHILS: 0.2 10*3/uL (ref 0.0–0.4)
ABS. IMM. GRANS.: 0 10*3/uL (ref 0.0–0.1)
ABS. MONOCYTES: 0.8 10*3/uL (ref 0.1–0.9)
ABS. NEUTROPHILS: 4.2 10*3/uL (ref 1.4–7.0)
Abs Lymphocytes: 2 10*3/uL (ref 0.7–3.1)
BASOPHILS: 1 %
EOSINOPHILS: 3 %
HCT: 43.5 % (ref 37.5–51.0)
HGB: 14.7 g/dL (ref 13.0–17.7)
IMMATURE GRANULOCYTES: 0 %
Lymphocytes: 28 %
MCH: 29.6 pg (ref 26.6–33.0)
MCHC: 33.8 g/dL (ref 31.5–35.7)
MCV: 88 fL (ref 79–97)
MONOCYTES: 11 %
NEUTROPHILS: 57 %
PLATELET: 200 10*3/uL (ref 150–450)
RBC: 4.97 x10E6/uL (ref 4.14–5.80)
RDW: 13.5 % (ref 11.6–15.4)
WBC: 7.3 10*3/uL (ref 3.4–10.8)

## 2019-11-29 LAB — TSH 3RD GENERATION
TSH: 4.34 u[IU]/mL (ref 0.450–4.500)
TSH: 4.34 u[IU]/mL (ref 0.450–4.500)

## 2019-11-29 LAB — VITAMIN B12
Vitamin B-12: 492 pg/mL (ref 232–1245)
Vitamin B12: 492 pg/mL (ref 232–1245)

## 2019-11-29 LAB — CBC WITH AUTO DIFFERENTIAL
Basophils %: 1 %
Basophils Absolute: 0.1 10*3/uL (ref 0.0–0.2)
Eosinophils %: 3 %
Eosinophils Absolute: 0.2 10*3/uL (ref 0.0–0.4)
Granulocyte Absolute Count: 0 10*3/uL (ref 0.0–0.1)
Hematocrit: 43.5 % (ref 37.5–51.0)
Hemoglobin: 14.7 g/dL (ref 13.0–17.7)
Immature Granulocytes: 0 %
Lymphocytes %: 28 %
Lymphocytes Absolute: 2 10*3/uL (ref 0.7–3.1)
MCH: 29.6 pg (ref 26.6–33.0)
MCHC: 33.8 g/dL (ref 31.5–35.7)
MCV: 88 fL (ref 79–97)
Monocytes %: 11 %
Monocytes Absolute: 0.8 10*3/uL (ref 0.1–0.9)
Neutrophils %: 57 %
Neutrophils Absolute: 4.2 10*3/uL (ref 1.4–7.0)
Platelets: 200 10*3/uL (ref 150–450)
RBC: 4.97 x10E6/uL (ref 4.14–5.80)
RDW: 13.5 % (ref 11.6–15.4)
WBC: 7.3 10*3/uL (ref 3.4–10.8)

## 2019-11-29 LAB — COMPREHENSIVE METABOLIC PANEL
ALT: 32 IU/L (ref 0–44)
AST: 26 IU/L (ref 0–40)
Albumin/Globulin Ratio: 2.2 NA (ref 1.2–2.2)
Albumin: 4.9 g/dL — ABNORMAL HIGH (ref 3.7–4.7)
Alkaline Phosphatase: 79 IU/L (ref 44–121)
BUN: 18 mg/dL (ref 8–27)
Bun/Cre Ratio: 19 NA (ref 10–24)
CO2: 23 mmol/L (ref 20–29)
Calcium: 9.6 mg/dL (ref 8.6–10.2)
Chloride: 103 mmol/L (ref 96–106)
Creatinine: 0.95 mg/dL (ref 0.76–1.27)
EGFR IF NonAfrican American: 79 mL/min/{1.73_m2} (ref >59)
GFR African American: 91 mL/min/{1.73_m2} (ref >59)
Globulin, Total: 2.2 g/dL (ref 1.5–4.5)
Glucose: 92 mg/dL (ref 65–99)
Potassium: 4.1 mmol/L (ref 3.5–5.2)
Sodium: 140 mmol/L (ref 134–144)
Total Bilirubin: 0.7 mg/dL (ref 0.0–1.2)
Total Protein: 7.1 g/dL (ref 6.0–8.5)

## 2019-11-30 MED ORDER — ATORVASTATIN 10 MG TAB
10 mg | ORAL_TABLET | Freq: Every evening | ORAL | 3 refills | Status: DC
Start: 2019-11-30 — End: 2020-05-06

## 2020-01-20 MED ORDER — FAMOTIDINE 20 MG TAB
20 mg | ORAL_TABLET | ORAL | 11 refills | Status: DC
Start: 2020-01-20 — End: 2021-01-23

## 2020-03-04 ENCOUNTER — Encounter: Attending: Internal Medicine | Primary: Internal Medicine

## 2020-05-03 ENCOUNTER — Ambulatory Visit: Admit: 2020-05-03 | Attending: Internal Medicine | Primary: Internal Medicine

## 2020-05-03 ENCOUNTER — Ambulatory Visit: Attending: Internal Medicine | Primary: Internal Medicine

## 2020-05-03 DIAGNOSIS — I10 Essential (primary) hypertension: Secondary | ICD-10-CM

## 2020-05-03 NOTE — Progress Notes (Signed)
Jeffrey Soto is a 75 y.o. wm   CC; RLQ pain     HPI   occasional RLQ pain better with  bm   Every since Prost bx , mild fecal leakage.    Past Medical History:   Diagnosis Date   ??? Arrhythmia     Hx PAT. dr. Ezequiel Ganser evaluated.    ??? Depression    ??? Elevated lipids 12/16/2009   ??? Elevated PSA     Dr. Ceasar Mons    ??? Fatty liver    ??? Gallbladder polyp    ??? GERD (gastroesophageal reflux disease) 06/26/03    EGD: duodenitis , dilation of Esophageal ring , DR. [name]   ??? HTN (hypertension) 12/16/2009   ??? HX OTHER MEDICAL     cubital tunnel,left elbow   ??? Prostate cancer ([place]) 06/02/2017    Low grade by report . 2019 . Dr. Ceasar Mons      Past Surgical History:   Procedure Laterality Date   ??? ENDOSCOPY, COLON, DIAGNOSTIC  06/26/03    diverticulosis, polypectomy, f/u dr. Gerald Dexter    ??? HX GI      right inguinal hernia repair x3   ??? HX OTHER SURGICAL      removal pilonidal cyst   ??? HX UROLOGICAL      right orchiectomy for benign growth   ??? HX UROLOGICAL  05/25/2017    Prostate biopsy by [name] MD     Current Outpatient Medications   Medication Sig Dispense Refill   ??? famotidine (PEPCID) 20 mg tablet TAKE ONE TABLET BY MOUTH TWICE A DAY 60 Tablet 11   ??? atorvastatin (LIPITOR) 10 mg tablet Take 1 Tablet by mouth every evening. 90 Tablet 3   ??? FLUoxetine (PROzac) 10 mg capsule Take 1 Capsule by mouth daily. 90 Capsule 1   ??? Cartia XT 300 mg capsule TAKE ONE CAPSULE BY MOUTH DAILY 90 Capsule 3   ??? lisinopriL (PRINIVIL, ZESTRIL) 20 mg tablet TAKE ONE TABLET BY MOUTH EVERY MORNING 90 Tablet 2   ??? psyllium (METAMUCIL) powd Take  by mouth daily.     ??? diphenhydrAMINE (BENADRYL) 50 mg tablet Take 50 mg by mouth nightly. (Patient not taking: Reported on 05/03/2020)     ??? diltiazem CD (CARTIA XT) 300 mg ER capsule TAKE ONE CAPSULE BY MOUTH DAILY 90 Cap 3   ??? lansoprazole (PREVACID) 15 mg capsule Take  by mouth Daily (before breakfast).       No Known Allergies  Family History   Problem Relation Age of Onset   ??? Cancer Sister         lung    ??? Cancer Sister         lung   ??? Lung Disease Father         emphysema   ??? Heart Disease Brother 39        sudden death  in sleep     Social History     Tobacco Use   Smoking Status Former Smoker   Smokeless Tobacco Never Used       Review of Systems  Denies dyspnea.  Cardiac:  Denies chest pain, palpitations,   GI: Bowels move regularly without hematochezia..  Low grade PCA f/b Dr. Davis Gourd q6 m . Urinary flow varies . Last DRE 6 m ago   Occasional minor hip, knee arthralgia   Denies fall 1 y ; passes Depression screen     Physical Examination:  Visit Vitals  BP  139/80   Temp 97.3 ??F (36.3 ??C)   Ht 6' 1"  (1.854 m)   Wt 220 lb (99.8 kg)   BMI 29.03 kg/m??     Physical Exam  General:   Appears in no acute distress.   HEENT:   Negative                   Lungs:   Clear        Heart:  Regular without murmur, gallop or rub       Abdomen:   Benign exam without organomegaly or mass palpable    gu nl genitalia Jeffrey Soto/o hernia            Rectal:  nl anal tone. Mildly enlarged smooth prostate. No rectal mass felt  heme negative    Extremities: No edema hips knees from    Neurologic: Grossly nonfocal        No results found for this or any previous visit (from the past 8 hour(s)).              Results for orders placed or performed in visit on 72/53/66   METABOLIC PANEL, COMPREHENSIVE   Result Value Ref Range    Glucose 114 (H) 65 - 99 mg/dL    BUN 16 8 - 27 mg/dL    Creatinine 0.83 0.76 - 1.27 mg/dL    eGFR 92 >59 mL/min/1.73    BUN/Creatinine ratio 19 10 - 24    Sodium 140 134 - 144 mmol/L    Potassium 4.2 3.5 - 5.2 mmol/L    Chloride 103 96 - 106 mmol/L    CO2 20 20 - 29 mmol/L    Calcium 9.4 8.6 - 10.2 mg/dL    Protein, total 7.6 6.0 - 8.5 g/dL    Albumin 5.0 (H) 3.7 - 4.7 g/dL    GLOBULIN, TOTAL 2.6 1.5 - 4.5 g/dL    A-G Ratio 1.9 1.2 - 2.2    Bilirubin, total 0.6 0.0 - 1.2 mg/dL    Alk. phosphatase 80 44 - 121 IU/L    AST (SGOT) 30 0 - 40 IU/L    ALT (SGPT) 31 0 - 44 IU/L   CBC WITH AUTOMATED DIFF   Result Value Ref Range     WBC 5.9 3.4 - 10.8 x10E3/uL    RBC 5.05 4.14 - 5.80 x10E6/uL    HGB 15.1 13.0 - 17.7 g/dL    HCT 45.2 37.5 - 51.0 %    MCV 90 79 - 97 fL    MCH 29.9 26.6 - 33.0 pg    MCHC 33.4 31.5 - 35.7 g/dL    RDW 13.1 11.6 - 15.4 %    PLATELET 207 150 - 450 x10E3/uL    NEUTROPHILS 57 Not Estab. %    Lymphocytes 26 Not Estab. %    MONOCYTES 12 Not Estab. %    EOSINOPHILS 4 Not Estab. %    BASOPHILS 1 Not Estab. %    ABS. NEUTROPHILS 3.4 1.4 - 7.0 x10E3/uL    Abs Lymphocytes 1.5 0.7 - 3.1 x10E3/uL    ABS. MONOCYTES 0.7 0.1 - 0.9 x10E3/uL    ABS. EOSINOPHILS 0.2 0.0 - 0.4 x10E3/uL    ABS. BASOPHILS 0.1 0.0 - 0.2 x10E3/uL    IMMATURE GRANULOCYTES 0 Not Estab. %    ABS. IMM. GRANS. 0.0 0.0 - 0.1 x10E3/uL   LIPID PANEL   Result Value Ref Range    Cholesterol, total 207 (H) 100 - 199 mg/dL  Triglyceride 237 (H) 0 - 149 mg/dL    HDL Cholesterol 37 (L) >39 mg/dL    VLDL, calculated 42 (H) 5 - 40 mg/dL    LDL, calculated 128 (H) 0 - 99 mg/dL     Assessment/Plan    ICD-10-CM ICD-9-CM    1. Essential hypertension  I10 401.9 COLLECTION VENOUS BLOOD,VENIPUNCTURE      METABOLIC PANEL, COMPREHENSIVE      CBC WITH AUTOMATED DIFF      LIPID PANEL      CK   2. Prostate cancer ([place])  C61 185 COLLECTION VENOUS BLOOD,VENIPUNCTURE      METABOLIC PANEL, COMPREHENSIVE      CBC WITH AUTOMATED DIFF      LIPID PANEL      CK   3. Hypercholesterolemia  E78.00 272.0 COLLECTION VENOUS BLOOD,VENIPUNCTURE      METABOLIC PANEL, COMPREHENSIVE      CBC WITH AUTOMATED DIFF      LIPID PANEL      CK   4. Encounter for long-term (current) use of other medications  Z79.899 V58.69 COLLECTION VENOUS BLOOD,VENIPUNCTURE      METABOLIC PANEL, COMPREHENSIVE      CBC WITH AUTOMATED DIFF      LIPID PANEL      CK   5. Medicare annual wellness visit, subsequent  Z00.00 V70.0 COLLECTION VENOUS BLOOD,VENIPUNCTURE      METABOLIC PANEL, COMPREHENSIVE      CBC WITH AUTOMATED DIFF      LIPID PANEL      CK   6. Fecal smearing  R15.1 787.62 CK   7. RLQ abdominal pain  R10.31 789.03 CK    8. Gastroesophageal reflux disease, unspecified whether esophagitis present  K21.9 530.81 CK   9. Hip pain  M25.559 719.45 CK   10. Pain in both knees, unspecified chronicity  M25.561 719.46 CK    M25.562       HTN. Controlled. Same rx   Lipids.P: anticipate  incr Lipitor to 20 mg every day and rpt Lipids , AST in 3 mos  PCA. Low grade. Continue f/u GU   Fecal smearing, RLQ abd pain. Advised GI f/u   GERD under tx. Same rx . F/u GI   Hip and knee intermittent discomfort. Possible early OA. Also check CK  Next CPE in 1 y; rv sooner prn       Author:  Elinda Bunten. Anibal Henderson, MD 9:08 AM3/07/2020   This is the Subsequent Medicare Annual Wellness Exam, performed 12 months or more after the Initial AWV or the last Subsequent AWV    I have reviewed the patient's medical history in detail and updated the computerized patient record.       Assessment/Plan   Education and counseling provided:  Are appropriate based on today's review and evaluation    1. Essential hypertension  -     COLLECTION VENOUS BLOOD,VENIPUNCTURE  -     METABOLIC PANEL, COMPREHENSIVE  -     CBC WITH AUTOMATED DIFF  -     LIPID PANEL  -     CK  2. Prostate cancer ([place])  -     COLLECTION VENOUS BLOOD,VENIPUNCTURE  -     METABOLIC PANEL, COMPREHENSIVE  -     CBC WITH AUTOMATED DIFF  -     LIPID PANEL  -     CK  3. Hypercholesterolemia  -     COLLECTION VENOUS BLOOD,VENIPUNCTURE  -     METABOLIC PANEL, COMPREHENSIVE  -  CBC WITH AUTOMATED DIFF  -     LIPID PANEL  -     CK  4. Encounter for long-term (current) use of other medications  -     COLLECTION VENOUS BLOOD,VENIPUNCTURE  -     METABOLIC PANEL, COMPREHENSIVE  -     CBC WITH AUTOMATED DIFF  -     LIPID PANEL  -     CK  5. Medicare annual wellness visit, subsequent  -     COLLECTION VENOUS BLOOD,VENIPUNCTURE  -     METABOLIC PANEL, COMPREHENSIVE  -     CBC WITH AUTOMATED DIFF  -     LIPID PANEL  -     CK  6. Fecal smearing  -     CK  7. RLQ abdominal pain  -     CK  8. Gastroesophageal reflux  disease, unspecified whether esophagitis present  -     CK  9. Hip pain  -     CK  10. Pain in both knees, unspecified chronicity  -     CK       Depression Risk Factor Screening     3 most recent PHQ Screens 05/03/2020   Little interest or pleasure in doing things Not at all   Feeling down, depressed, irritable, or hopeless Not at all   Total Score PHQ 2 0       Alcohol & Drug Abuse Risk Screen    Do you average more than 1 drink per night or more than 7 drinks a week: No    In the past three months have you have had more than 4 drinks containing alcohol on one occasion: No          Functional Ability and Level of Safety    Hearing: The patient wears hearing aids.      Activities of Daily Living:  The home contains: handrails  Patient does total self care      Ambulation: with no difficulty     Fall Risk:  Fall Risk Assessment, last 12 mths 05/03/2020   Able to walk? Yes   Fall in past 12 months? 0   Do you feel unsteady? 0   Are you worried about falling 0   Number of falls in past 12 months -   Fall with injury? -      Abuse Screen:  Patient is not abused       Cognitive Screening    Has your family/caregiver stated any concerns about your memory: no     Cognitive Screening: Normal - Clock Drawing Test    Health Maintenance Due     Health Maintenance Due   Topic Date Due   ??? Colorectal Cancer Screening Combo  10/27/2015       Patient Care Team   Patient Care Team:  Darlina Rumpf, MD as PCP - General (Internal Medicine)    History     Patient Active Problem List   Diagnosis Code   ??? HTN (hypertension) I10   ??? Elevated lipids E78.5   ??? Fatty liver K76.0   ??? Elevated PSA R97.20   ??? Prostate cancer ([place]) C61   ??? GERD (gastroesophageal reflux disease) K21.9     Past Medical History:   Diagnosis Date   ??? Arrhythmia     Hx PAT. dr. Ezequiel Ganser evaluated.    ??? Depression    ??? Elevated lipids 12/16/2009   ??? Elevated PSA  Dr. Ceasar Mons    ??? Fatty liver    ??? Gallbladder polyp    ??? GERD (gastroesophageal reflux  disease) 06/26/03    EGD: duodenitis , dilation of Esophageal ring , DR. [name]   ??? HTN (hypertension) 12/16/2009   ??? HX OTHER MEDICAL     cubital tunnel,left elbow   ??? Prostate cancer ([place]) 06/02/2017    Low grade by report . 2019 . Dr. Ceasar Mons       Past Surgical History:   Procedure Laterality Date   ??? ENDOSCOPY, COLON, DIAGNOSTIC  06/26/03    diverticulosis, polypectomy, f/u dr. Gerald Dexter    ??? HX GI      right inguinal hernia repair x3   ??? HX OTHER SURGICAL      removal pilonidal cyst   ??? HX UROLOGICAL      right orchiectomy for benign growth   ??? HX UROLOGICAL  05/25/2017    Prostate biopsy by [name] MD     Current Outpatient Medications   Medication Sig Dispense Refill   ??? famotidine (PEPCID) 20 mg tablet TAKE ONE TABLET BY MOUTH TWICE A DAY 60 Tablet 11   ??? atorvastatin (LIPITOR) 10 mg tablet Take 1 Tablet by mouth every evening. 90 Tablet 3   ??? FLUoxetine (PROzac) 10 mg capsule Take 1 Capsule by mouth daily. 90 Capsule 1   ??? Cartia XT 300 mg capsule TAKE ONE CAPSULE BY MOUTH DAILY 90 Capsule 3   ??? lisinopriL (PRINIVIL, ZESTRIL) 20 mg tablet TAKE ONE TABLET BY MOUTH EVERY MORNING 90 Tablet 2   ??? psyllium (METAMUCIL) powd Take  by mouth daily.     ??? diphenhydrAMINE (BENADRYL) 50 mg tablet Take 50 mg by mouth nightly. (Patient not taking: Reported on 05/03/2020)     ??? diltiazem CD (CARTIA XT) 300 mg ER capsule TAKE ONE CAPSULE BY MOUTH DAILY 90 Cap 3   ??? lansoprazole (PREVACID) 15 mg capsule Take  by mouth Daily (before breakfast).       No Known Allergies    Family History   Problem Relation Age of Onset   ??? Cancer Sister         lung   ??? Cancer Sister         lung   ??? Lung Disease Father         emphysema   ??? Heart Disease Brother 87        sudden death  in sleep     Social History     Tobacco Use   ??? Smoking status: Former Smoker   ??? Smokeless tobacco: Never Used   Substance Use Topics   ??? Alcohol use: Yes     Comment: 1 drink/month         Aaima Gaddie. Anibal Henderson, MD

## 2020-05-04 LAB — CBC WITH AUTOMATED DIFF
ABS. BASOPHILS: 0.1 10*3/uL (ref 0.0–0.2)
ABS. EOSINOPHILS: 0.2 10*3/uL (ref 0.0–0.4)
ABS. IMM. GRANS.: 0 10*3/uL (ref 0.0–0.1)
ABS. MONOCYTES: 0.7 10*3/uL (ref 0.1–0.9)
ABS. NEUTROPHILS: 3.4 10*3/uL (ref 1.4–7.0)
Abs Lymphocytes: 1.5 10*3/uL (ref 0.7–3.1)
BASOPHILS: 1 %
EOSINOPHILS: 4 %
HCT: 45.2 % (ref 37.5–51.0)
HGB: 15.1 g/dL (ref 13.0–17.7)
IMMATURE GRANULOCYTES: 0 %
Lymphocytes: 26 %
MCH: 29.9 pg (ref 26.6–33.0)
MCHC: 33.4 g/dL (ref 31.5–35.7)
MCV: 90 fL (ref 79–97)
MONOCYTES: 12 %
NEUTROPHILS: 57 %
PLATELET: 207 10*3/uL (ref 150–450)
RBC: 5.05 x10E6/uL (ref 4.14–5.80)
RDW: 13.1 % (ref 11.6–15.4)
WBC: 5.9 10*3/uL (ref 3.4–10.8)

## 2020-05-04 LAB — LIPID PANEL
Cholesterol, Total: 207 mg/dL — ABNORMAL HIGH (ref 100–199)
Cholesterol, total: 207 mg/dL — ABNORMAL HIGH (ref 100–199)
HDL Cholesterol: 37 mg/dL — ABNORMAL LOW (ref >39)
HDL: 37 mg/dL — ABNORMAL LOW (ref >39)
LDL Calculated: 128 mg/dL — ABNORMAL HIGH (ref 0–99)
LDL, calculated: 128 mg/dL — ABNORMAL HIGH (ref 0–99)
Triglyceride: 237 mg/dL — ABNORMAL HIGH (ref 0–149)
Triglycerides: 237 mg/dL — ABNORMAL HIGH (ref 0–149)
VLDL, calculated: 42 mg/dL — ABNORMAL HIGH (ref 5–40)
VLDL: 42 mg/dL — ABNORMAL HIGH (ref 5–40)

## 2020-05-04 LAB — METABOLIC PANEL, COMPREHENSIVE
A-G Ratio: 1.9 (ref 1.2–2.2)
ALT (SGPT): 31 IU/L (ref 0–44)
AST (SGOT): 30 IU/L (ref 0–40)
Albumin: 5 g/dL — ABNORMAL HIGH (ref 3.7–4.7)
Alk. phosphatase: 80 IU/L (ref 44–121)
BUN/Creatinine ratio: 19 (ref 10–24)
BUN: 16 mg/dL (ref 8–27)
Bilirubin, total: 0.6 mg/dL (ref 0.0–1.2)
CO2: 20 mmol/L (ref 20–29)
Calcium: 9.4 mg/dL (ref 8.6–10.2)
Chloride: 103 mmol/L (ref 96–106)
Creatinine: 0.83 mg/dL (ref 0.76–1.27)
GLOBULIN, TOTAL: 2.6 g/dL (ref 1.5–4.5)
Glucose: 114 mg/dL — ABNORMAL HIGH (ref 65–99)
Potassium: 4.2 mmol/L (ref 3.5–5.2)
Protein, total: 7.6 g/dL (ref 6.0–8.5)
Sodium: 140 mmol/L (ref 134–144)
eGFR: 92 mL/min/{1.73_m2} (ref >59)

## 2020-05-04 LAB — CBC WITH AUTO DIFFERENTIAL
Basophils %: 1 %
Basophils Absolute: 0.1 10*3/uL (ref 0.0–0.2)
Eosinophils %: 4 %
Eosinophils Absolute: 0.2 10*3/uL (ref 0.0–0.4)
Granulocyte Absolute Count: 0 10*3/uL (ref 0.0–0.1)
Hematocrit: 45.2 % (ref 37.5–51.0)
Hemoglobin: 15.1 g/dL (ref 13.0–17.7)
Immature Granulocytes: 0 %
Lymphocytes %: 26 %
Lymphocytes Absolute: 1.5 10*3/uL (ref 0.7–3.1)
MCH: 29.9 pg (ref 26.6–33.0)
MCHC: 33.4 g/dL (ref 31.5–35.7)
MCV: 90 fL (ref 79–97)
Monocytes %: 12 %
Monocytes Absolute: 0.7 10*3/uL (ref 0.1–0.9)
Neutrophils %: 57 %
Neutrophils Absolute: 3.4 10*3/uL (ref 1.4–7.0)
Platelets: 207 10*3/uL (ref 150–450)
RBC: 5.05 x10E6/uL (ref 4.14–5.80)
RDW: 13.1 % (ref 11.6–15.4)
WBC: 5.9 10*3/uL (ref 3.4–10.8)

## 2020-05-04 LAB — COMPREHENSIVE METABOLIC PANEL
ALT: 31 IU/L (ref 0–44)
AST: 30 IU/L (ref 0–40)
Albumin/Globulin Ratio: 1.9 NA (ref 1.2–2.2)
Albumin: 5 g/dL — ABNORMAL HIGH (ref 3.7–4.7)
Alkaline Phosphatase: 80 IU/L (ref 44–121)
BUN: 16 mg/dL (ref 8–27)
Bun/Cre Ratio: 19 NA (ref 10–24)
CO2: 20 mmol/L (ref 20–29)
Calcium: 9.4 mg/dL (ref 8.6–10.2)
Chloride: 103 mmol/L (ref 96–106)
Creatinine: 0.83 mg/dL (ref 0.76–1.27)
Est, Glomerular Filtration Rate: 92 mL/min/{1.73_m2} (ref >59)
Globulin, Total: 2.6 g/dL (ref 1.5–4.5)
Glucose: 114 mg/dL — ABNORMAL HIGH (ref 65–99)
Potassium: 4.2 mmol/L (ref 3.5–5.2)
Sodium: 140 mmol/L (ref 134–144)
Total Bilirubin: 0.6 mg/dL (ref 0.0–1.2)
Total Protein: 7.6 g/dL (ref 6.0–8.5)

## 2020-05-06 MED ORDER — ATORVASTATIN 20 MG TAB
20 mg | ORAL_TABLET | Freq: Every day | ORAL | 3 refills | Status: DC
Start: 2020-05-06 — End: 2021-05-05

## 2020-05-06 NOTE — Telephone Encounter (Signed)
Left VM Annalisa Colonna/ results. Advised wt loss via ADA diet and exercise. incr Lipitor to 20 mg daily. Rpt lipids, CMP in 3 months . Follow up sooner prn

## 2020-05-08 LAB — SPECIMEN STATUS REPORT

## 2020-05-08 LAB — CK
Creatine Kinase,Total: 218 U/L (ref 41–331)
Total CK: 218 U/L (ref 41–331)

## 2020-05-26 MED ORDER — LISINOPRIL 20 MG TAB
20 mg | ORAL_TABLET | ORAL | 3 refills | Status: DC
Start: 2020-05-26 — End: 2020-05-28

## 2020-05-28 MED ORDER — LISINOPRIL 20 MG TAB
20 mg | ORAL_TABLET | ORAL | 3 refills | Status: DC
Start: 2020-05-28 — End: 2021-05-30

## 2020-09-30 MED ORDER — DILTIAZEM ER 300 MG 24 HR CAP
300 mg | ORAL_CAPSULE | ORAL | 3 refills | Status: AC
Start: 2020-09-30 — End: ?

## 2021-01-23 MED ORDER — FAMOTIDINE 20 MG TAB
20 mg | ORAL_TABLET | ORAL | 11 refills | Status: AC
Start: 2021-01-23 — End: ?

## 2021-05-05 MED ORDER — ATORVASTATIN 20 MG TAB
20 mg | ORAL_TABLET | ORAL | 3 refills | Status: AC
Start: 2021-05-05 — End: ?

## 2021-05-08 ENCOUNTER — Encounter: Attending: Internal Medicine | Primary: Internal Medicine

## 2021-05-17 MED ORDER — FLUOXETINE 10 MG CAP
10 mg | ORAL_CAPSULE | ORAL | 1 refills | Status: AC
Start: 2021-05-17 — End: ?

## 2021-05-30 ENCOUNTER — Ambulatory Visit: Admit: 2021-05-30 | Attending: Internal Medicine | Primary: Internal Medicine

## 2021-05-30 DIAGNOSIS — I10 Essential (primary) hypertension: Secondary | ICD-10-CM

## 2021-05-30 MED ORDER — LISINOPRIL 40 MG TAB
40 mg | ORAL_TABLET | Freq: Every day | ORAL | 11 refills | Status: AC
Start: 2021-05-30 — End: ?

## 2021-05-30 NOTE — Addendum Note (Signed)
Addendum Note by Ysidro Evert, MD at 05/30/21 1430                Author: Ysidro Evert, MD  Service: --  Author Type: Physician       Filed: 05/31/21 1734  Encounter Date: 05/30/2021  Status: Signed          Editor: Ysidro Evert, MD (Physician)          Addended by: Coralyn Pear IV on: 05/31/2021 05:34 PM    Modules accepted: Level of Service

## 2021-05-30 NOTE — Progress Notes (Signed)
Jeffrey Soto is a 76 y.o. wm CC: R knee     HPI  s/p rt knee injury , took PT through Ortho and knee feels improved   Past Medical History:   Diagnosis Date    Arrhythmia     Hx PAT. dr. Ezequiel Ganser evaluated.     Depression     Elevated lipids 12/16/2009    Fatty liver     Gallbladder polyp     GERD (gastroesophageal reflux disease) 06/26/2003    EGD: duodenitis , dilation of Esophageal ring , DR. [name]    HTN (hypertension) 12/16/2009    HX OTHER MEDICAL     cubital tunnel,left elbow    Prostate cancer ([place]) 06/02/2017    GLeason 3+ 3= 6 . 2019 . Dr. Ceasar Mons     Past Surgical History:   Procedure Laterality Date    ENDOSCOPY, COLON, DIAGNOSTIC  06/26/03    diverticulosis, polypectomy, f/u dr. Campbell Riches [facility]      right inguinal hernia repair x3    HX OTHER SURGICAL      removal pilonidal cyst    HX UROLOGICAL      right orchiectomy for benign growth    HX UROLOGICAL  05/25/2017    Prostate biopsy by [name] MD     Current Outpatient Medications   Medication Sig Dispense Refill    tamsulosin (FLOMAX) 0.4 mg capsule Take 0.4 mg by mouth nightly.      lisinopriL (PRINIVIL, ZESTRIL) 40 mg tablet Take 1 Tablet by mouth daily. In place of Lisinopril 20 mg 30 Tablet 11    FLUoxetine (PROzac) 10 mg capsule TAKE ONE CAPSULE BY MOUTH DAILY 90 Capsule 1    atorvastatin (LIPITOR) 20 mg tablet 1 tablet by mouth daily 90 Tablet 3    famotidine (PEPCID) 20 mg tablet TAKE ONE TABLET BY MOUTH TWICE A DAY 60 Tablet 11    dilTIAZem ER (CARDIZEM CD) 300 mg capsule TAKE ONE CAPSULE BY MOUTH DAILY 90 Capsule 3    psyllium (METAMUCIL) powd Take  by mouth daily.      diphenhydrAMINE (BENADRYL) 50 mg tablet Take 50 mg by mouth nightly. (Patient not taking: Reported on 05/03/2020)      diltiazem CD (CARTIA XT) 300 mg ER capsule TAKE ONE CAPSULE BY MOUTH DAILY 90 Cap 3    lansoprazole (PREVACID) 15 mg capsule Take  by mouth Daily (before breakfast).       No Known Allergies  Family History   Problem Relation Age of Onset    Cancer Sister          lung    Cancer Sister         lung    Lung Disease Father         emphysema    Heart Disease Brother 70        sudden death  in sleep     Social History     Tobacco Use   Smoking Status Former   Smokeless Tobacco Never       Review of Systems  Denies dyspnea.  Cardiac:  Denies chest pain  GI: Bowels move regularly without hematochezia.Marland Kitchen  Urination without difficulty  Denies fall past 1 y ; passes Depression screen     Physical Examination:  Visit Vitals  BP (!) 142/84   Temp 96.8 F (36 C)   Ht 5' 11.5" (1.816 m)   Wt 230 lb (104.3 kg)   BMI 31.63  kg/m     Physical Exam  General:   Appears in no acute distress.   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    No results found for this or any previous visit (from the past 8 hour(s)).              Results for orders placed or performed in visit on 05/30/21   LIPID PANEL   Result Value Ref Range    Cholesterol, total 179 100 - 199 mg/dL    Triglyceride 209 (H) 0 - 149 mg/dL    HDL Cholesterol 38 (L) >39 mg/dL    VLDL, calculated 36 5 - 40 mg/dL    LDL, calculated 105 (H) 0 - 99 mg/dL   CBC WITH AUTOMATED DIFF   Result Value Ref Range    WBC 7.4 3.4 - 10.8 x10E3/uL    RBC 4.90 4.14 - 5.80 x10E6/uL    HGB 14.9 13.0 - 17.7 g/dL    HCT 43.7 37.5 - 51.0 %    MCV 89 79 - 97 fL    MCH 30.4 26.6 - 33.0 pg    MCHC 34.1 31.5 - 35.7 g/dL    RDW 12.9 11.6 - 15.4 %    PLATELET 220 150 - 450 x10E3/uL    NEUTROPHILS 61 Not Estab. %    Lymphocytes 26 Not Estab. %    MONOCYTES 10 Not Estab. %    EOSINOPHILS 2 Not Estab. %    BASOPHILS 1 Not Estab. %    ABS. NEUTROPHILS 4.5 1.4 - 7.0 x10E3/uL    Abs Lymphocytes 1.9 0.7 - 3.1 x10E3/uL    ABS. MONOCYTES 0.7 0.1 - 0.9 x10E3/uL    ABS. EOSINOPHILS 0.2 0.0 - 0.4 x10E3/uL     ABS. BASOPHILS 0.1 0.0 - 0.2 x10E3/uL    IMMATURE GRANULOCYTES 0 Not Estab. %    ABS. IMM. GRANS. 0.0 0.0 - 0.1 G25K2/HC   METABOLIC PANEL, COMPREHENSIVE   Result Value Ref Range    Glucose 89 70 - 99 mg/dL    BUN 15 8 - 27 mg/dL    Creatinine 0.97 0.76 - 1.27 mg/dL    eGFR 81 >59 mL/min/1.73    BUN/Creatinine ratio 15 10 - 24    Sodium 141 134 - 144 mmol/L    Potassium 4.5 3.5 - 5.2 mmol/L    Chloride 101 96 - 106 mmol/L    CO2 24 20 - 29 mmol/L    Calcium 9.6 8.6 - 10.2 mg/dL    Protein, total 7.2 6.0 - 8.5 g/dL    Albumin 4.9 (H) 3.7 - 4.7 g/dL    GLOBULIN, TOTAL 2.3 1.5 - 4.5 g/dL    A-G Ratio 2.1 1.2 - 2.2    Bilirubin, total 0.6 0.0 - 1.2 mg/dL    Alk. phosphatase 85 44 - 121 IU/L    AST (SGOT) 26 0 - 40 IU/L    ALT (SGPT) 29 0 - 44 IU/L  Assessment/Plan    ICD-10-CM ICD-9-CM    1. Essential hypertension  I10 401.9 AMB POC EKG ROUTINE Shebra Muldrow/ 12 LEADS, INTER & REP      LIPID PANEL      CBC WITH AUTOMATED DIFF      PR COLLECTION VENOUS BLOOD VENIPUNCTURE      METABOLIC PANEL, COMPREHENSIVE      2. Encounter for long-term (current) use of other medications  Z79.899 V58.69 AMB POC EKG ROUTINE Zoila Ditullio/ 12 LEADS, INTER & REP      LIPID PANEL      CBC WITH AUTOMATED DIFF      PR COLLECTION VENOUS BLOOD VENIPUNCTURE      METABOLIC PANEL, COMPREHENSIVE      3. Hypercholesterolemia  E78.00 272.0 AMB POC EKG ROUTINE Tallula Grindle/ 12 LEADS, INTER & REP      LIPID PANEL      CBC WITH AUTOMATED DIFF      PR COLLECTION VENOUS BLOOD VENIPUNCTURE      METABOLIC PANEL, COMPREHENSIVE      4. Gastroesophageal reflux disease, unspecified whether esophagitis present  K21.9 530.81       5. Obesity (BMI 30.0-34.9)  E66.9 278.00         Htn. Incr Prinivil to 40 mg every day   Lipids controlled. Same rx   GERD. Continue PPI   Obesity . P: gradual wt loss via diet, exercise.   HM : advised screening colonoscopy   RV 3 wks for OV /BP check or prn sooner       Author:  Darlina Rumpf, MD 2:48 PM4/03/2021 This is the Subsequent Medicare Annual  Wellness Exam, performed 12 months or more after the Initial AWV or the last Subsequent AWV    I have reviewed the patient's medical history in detail and updated the computerized patient record.       Assessment/Plan   Education and counseling provided:  Are appropriate based on today's review and evaluation    1. Essential hypertension  -     AMB POC EKG ROUTINE Desteny Freeman/ 12 LEADS, INTER & REP  -     LIPID PANEL  -     CBC WITH AUTOMATED DIFF  -     PR COLLECTION VENOUS BLOOD VENIPUNCTURE  -     METABOLIC PANEL, COMPREHENSIVE  2. Encounter for long-term (current) use of other medications  -     AMB POC EKG ROUTINE Jaiyden Laur/ 12 LEADS, INTER & REP  -     LIPID PANEL  -     CBC WITH AUTOMATED DIFF  -     PR COLLECTION VENOUS BLOOD VENIPUNCTURE  -     METABOLIC PANEL, COMPREHENSIVE  3. Hypercholesterolemia  -     AMB POC EKG ROUTINE Tiki Tucciarone/ 12 LEADS, INTER & REP  -     LIPID PANEL  -     CBC WITH AUTOMATED DIFF  -     PR COLLECTION VENOUS BLOOD VENIPUNCTURE  -     METABOLIC PANEL, COMPREHENSIVE  4. Gastroesophageal reflux disease, unspecified whether esophagitis present  5. Obesity (BMI 30.0-34.9)       Depression Risk Factor Screening     3 most recent PHQ Screens 05/30/2021   Little interest or pleasure in doing things Not at all   Feeling down, depressed, irritable, or hopeless Not at all   Total Score PHQ 2 0       Alcohol & Drug Abuse Risk Screen    Do you  average more than 1 drink per night or more than 7 drinks a week: No    In the past three months have you have had more than 4 drinks containing alcohol on one occasion: No          Functional Ability and Level of Safety    Hearing: Hearing is good. The patient wears hearing aids.      Activities of Daily Living:  The home contains: handrails  Patient does total self care      Ambulation: with no difficulty     Fall Risk:  Fall Risk Assessment, last 12 mths 05/30/2021   Able to walk? Yes   Fall in past 12 months? 1   Do you feel unsteady? 0   Are you worried about falling 0   Is  TUG test greater than 12 seconds? 0   Is the gait abnormal? 0   Number of falls in past 12 months 1   Fall with injury? 1      Abuse Screen:  Patient is not abused       Cognitive Screening    Has your family/caregiver stated any concerns about your memory: no     Cognitive Screening: Normal - Clock Drawing Test    Health Maintenance Due     Health Maintenance Due   Topic Date Due    Colorectal Cancer Screening Combo  10/27/2015    Medicare Yearly Exam  05/04/2021       Patient Care Team   Patient Care Team:  Darlina Rumpf, MD as PCP - General (Internal Medicine Physician)    History     Patient Active Problem List   Diagnosis Code    HTN (hypertension) I10    Elevated lipids E78.5    Fatty liver K76.0    Elevated PSA R97.20    Prostate cancer ([place]) C61    GERD (gastroesophageal reflux disease) K21.9     Past Medical History:   Diagnosis Date    Arrhythmia     Hx PAT. dr. Ezequiel Ganser evaluated.     Depression     Elevated lipids 12/16/2009    Fatty liver     Gallbladder polyp     GERD (gastroesophageal reflux disease) 06/26/2003    EGD: duodenitis , dilation of Esophageal ring , DR. [name]    HTN (hypertension) 12/16/2009    HX OTHER MEDICAL     cubital tunnel,left elbow    Prostate cancer ([place]) 06/02/2017    GLeason 3+ 3= 6 . 2019 . Dr. Ceasar Mons      Past Surgical History:   Procedure Laterality Date    ENDOSCOPY, COLON, DIAGNOSTIC  06/26/03    diverticulosis, polypectomy, f/u dr. Campbell Riches GI      right inguinal hernia repair x3    HX OTHER SURGICAL      removal pilonidal cyst    HX UROLOGICAL      right orchiectomy for benign growth    HX UROLOGICAL  05/25/2017    Prostate biopsy by [name] MD     Current Outpatient Medications   Medication Sig Dispense Refill    tamsulosin (FLOMAX) 0.4 mg capsule Take 0.4 mg by mouth nightly.      lisinopriL (PRINIVIL, ZESTRIL) 40 mg tablet Take 1 Tablet by mouth daily. In place of Lisinopril 20 mg 30 Tablet 11    FLUoxetine (PROzac) 10 mg capsule TAKE ONE CAPSULE  BY MOUTH DAILY 90 Capsule 1  atorvastatin (LIPITOR) 20 mg tablet 1 tablet by mouth daily 90 Tablet 3    famotidine (PEPCID) 20 mg tablet TAKE ONE TABLET BY MOUTH TWICE A DAY 60 Tablet 11    dilTIAZem ER (CARDIZEM CD) 300 mg capsule TAKE ONE CAPSULE BY MOUTH DAILY 90 Capsule 3    psyllium (METAMUCIL) powd Take  by mouth daily.      diphenhydrAMINE (BENADRYL) 50 mg tablet Take 50 mg by mouth nightly. (Patient not taking: Reported on 05/03/2020)      diltiazem CD (CARTIA XT) 300 mg ER capsule TAKE ONE CAPSULE BY MOUTH DAILY 90 Cap 3    lansoprazole (PREVACID) 15 mg capsule Take  by mouth Daily (before breakfast).       No Known Allergies    Family History   Problem Relation Age of Onset    Cancer Sister         lung    Cancer Sister         lung    Lung Disease Father         emphysema    Heart Disease Brother 47        sudden death  in sleep     Social History     Tobacco Use    Smoking status: Former    Smokeless tobacco: Never   Substance Use Topics    Alcohol use: Yes     Comment: 1 drink/month         Durham Grosser. Anibal Henderson, MD

## 2021-05-31 LAB — CBC WITH AUTOMATED DIFF
ABS. BASOPHILS: 0.1 10*3/uL (ref 0.0–0.2)
ABS. EOSINOPHILS: 0.2 10*3/uL (ref 0.0–0.4)
ABS. IMM. GRANS.: 0 10*3/uL (ref 0.0–0.1)
ABS. MONOCYTES: 0.7 10*3/uL (ref 0.1–0.9)
ABS. NEUTROPHILS: 4.5 10*3/uL (ref 1.4–7.0)
Abs Lymphocytes: 1.9 10*3/uL (ref 0.7–3.1)
BASOPHILS: 1 %
EOSINOPHILS: 2 %
HCT: 43.7 % (ref 37.5–51.0)
HGB: 14.9 g/dL (ref 13.0–17.7)
IMMATURE GRANULOCYTES: 0 %
Lymphocytes: 26 %
MCH: 30.4 pg (ref 26.6–33.0)
MCHC: 34.1 g/dL (ref 31.5–35.7)
MCV: 89 fL (ref 79–97)
MONOCYTES: 10 %
NEUTROPHILS: 61 %
PLATELET: 220 10*3/uL (ref 150–450)
RBC: 4.9 x10E6/uL (ref 4.14–5.80)
RDW: 12.9 % (ref 11.6–15.4)
WBC: 7.4 10*3/uL (ref 3.4–10.8)

## 2021-05-31 LAB — METABOLIC PANEL, COMPREHENSIVE
A-G Ratio: 2.1 (ref 1.2–2.2)
ALT (SGPT): 29 [IU]/L (ref 0–44)
AST (SGOT): 26 [IU]/L (ref 0–40)
Albumin: 4.9 g/dL — ABNORMAL HIGH (ref 3.7–4.7)
Alk. phosphatase: 85 [IU]/L (ref 44–121)
BUN/Creatinine ratio: 15 (ref 10–24)
BUN: 15 mg/dL (ref 8–27)
Bilirubin, total: 0.6 mg/dL (ref 0.0–1.2)
CO2: 24 mmol/L (ref 20–29)
Calcium: 9.6 mg/dL (ref 8.6–10.2)
Chloride: 101 mmol/L (ref 96–106)
Creatinine: 0.97 mg/dL (ref 0.76–1.27)
GLOBULIN, TOTAL: 2.3 g/dL (ref 1.5–4.5)
Glucose: 89 mg/dL (ref 70–99)
Potassium: 4.5 mmol/L (ref 3.5–5.2)
Protein, total: 7.2 g/dL (ref 6.0–8.5)
Sodium: 141 mmol/L (ref 134–144)
eGFR: 81 mL/min/{1.73_m2} (ref >59)

## 2021-05-31 LAB — LIPID PANEL
Cholesterol, Total: 179 mg/dL (ref 100–199)
Cholesterol, total: 179 mg/dL (ref 100–199)
HDL Cholesterol: 38 mg/dL — ABNORMAL LOW (ref >39)
HDL: 38 mg/dL — ABNORMAL LOW (ref >39)
LDL Calculated: 105 mg/dL — ABNORMAL HIGH (ref 0–99)
LDL, calculated: 105 mg/dL — ABNORMAL HIGH (ref 0–99)
Triglyceride: 209 mg/dL — ABNORMAL HIGH (ref 0–149)
Triglycerides: 209 mg/dL — ABNORMAL HIGH (ref 0–149)
VLDL, calculated: 36 mg/dL (ref 5–40)
VLDL: 36 mg/dL (ref 5–40)

## 2021-05-31 LAB — COMPREHENSIVE METABOLIC PANEL
ALT: 29 IU/L (ref 0–44)
AST: 26 IU/L (ref 0–40)
Albumin/Globulin Ratio: 2.1 NA (ref 1.2–2.2)
Albumin: 4.9 g/dL — ABNORMAL HIGH (ref 3.7–4.7)
Alkaline Phosphatase: 85 IU/L (ref 44–121)
BUN: 15 mg/dL (ref 8–27)
Bun/Cre Ratio: 15 NA (ref 10–24)
CO2: 24 mmol/L (ref 20–29)
Calcium: 9.6 mg/dL (ref 8.6–10.2)
Chloride: 101 mmol/L (ref 96–106)
Creatinine: 0.97 mg/dL (ref 0.76–1.27)
Est, Glomerular Filtration Rate: 81 mL/min/{1.73_m2} (ref >59)
Globulin, Total: 2.3 g/dL (ref 1.5–4.5)
Glucose: 89 mg/dL (ref 70–99)
Potassium: 4.5 mmol/L (ref 3.5–5.2)
Sodium: 141 mmol/L (ref 134–144)
Total Bilirubin: 0.6 mg/dL (ref 0.0–1.2)
Total Protein: 7.2 g/dL (ref 6.0–8.5)

## 2021-05-31 LAB — CBC WITH AUTO DIFFERENTIAL
Basophils %: 1 %
Basophils Absolute: 0.1 10*3/uL (ref 0.0–0.2)
Eosinophils %: 2 %
Eosinophils Absolute: 0.2 10*3/uL (ref 0.0–0.4)
Granulocyte Absolute Count: 0 10*3/uL (ref 0.0–0.1)
Hematocrit: 43.7 % (ref 37.5–51.0)
Hemoglobin: 14.9 g/dL (ref 13.0–17.7)
Immature Granulocytes: 0 %
Lymphocytes %: 26 %
Lymphocytes Absolute: 1.9 10*3/uL (ref 0.7–3.1)
MCH: 30.4 pg (ref 26.6–33.0)
MCHC: 34.1 g/dL (ref 31.5–35.7)
MCV: 89 fL (ref 79–97)
Monocytes %: 10 %
Monocytes Absolute: 0.7 10*3/uL (ref 0.1–0.9)
Neutrophils %: 61 %
Neutrophils Absolute: 4.5 10*3/uL (ref 1.4–7.0)
Platelets: 220 10*3/uL (ref 150–450)
RBC: 4.9 x10E6/uL (ref 4.14–5.80)
RDW: 12.9 % (ref 11.6–15.4)
WBC: 7.4 10*3/uL (ref 3.4–10.8)

## 2021-06-03 NOTE — Telephone Encounter (Signed)
Left VM Jeffrey Soto/ results. Advised grad wt loss via diet and exercise. Continue meds as prescribed.

## 2021-06-19 ENCOUNTER — Ambulatory Visit: Admit: 2021-06-19 | Attending: Internal Medicine | Primary: Internal Medicine

## 2021-06-19 ENCOUNTER — Ambulatory Visit: Attending: Internal Medicine | Primary: Internal Medicine

## 2021-06-19 DIAGNOSIS — M25561 Pain in right knee: Secondary | ICD-10-CM

## 2021-06-19 NOTE — Progress Notes (Signed)
Jeffrey Soto is a 76 y.o. wm CC: R knee pain     SUBJECTIVE:    Rknee djd   Yesterday golf and mild pain in knee   Past Medical History:   Diagnosis Date    Arrhythmia     Hx PAT. dr. Ezequiel Ganser evaluated.     Depression     Elevated lipids 12/16/2009    Fatty liver     Gallbladder polyp     GERD (gastroesophageal reflux disease) 06/26/2003    EGD: duodenitis , dilation of Esophageal ring , DR. [name]    HTN (hypertension) 12/16/2009    HX OTHER MEDICAL     cubital tunnel,left elbow    Prostate cancer ([place]) 06/02/2017    GLeason 3+ 3= 6 . 2019 . Dr. Ceasar Mons     Current Outpatient Medications   Medication Sig Dispense Refill    tamsulosin (FLOMAX) 0.4 mg capsule Take 0.4 mg by mouth nightly.      lisinopriL (PRINIVIL, ZESTRIL) 40 mg tablet Take 1 Tablet by mouth daily. In place of Lisinopril 20 mg 30 Tablet 11    FLUoxetine (PROzac) 10 mg capsule TAKE ONE CAPSULE BY MOUTH DAILY 90 Capsule 1    atorvastatin (LIPITOR) 20 mg tablet 1 tablet by mouth daily 90 Tablet 3    famotidine (PEPCID) 20 mg tablet TAKE ONE TABLET BY MOUTH TWICE A DAY 60 Tablet 11    dilTIAZem ER (CARDIZEM CD) 300 mg capsule TAKE ONE CAPSULE BY MOUTH DAILY 90 Capsule 3    psyllium (METAMUCIL) powd Take  by mouth daily.      diphenhydrAMINE (BENADRYL) 50 mg tablet Take 50 mg by mouth nightly. (Patient not taking: Reported on 05/03/2020)      diltiazem CD (CARTIA XT) 300 mg ER capsule TAKE ONE CAPSULE BY MOUTH DAILY 90 Cap 3    lansoprazole (PREVACID) 15 mg capsule Take  by mouth Daily (before breakfast).       No Known Allergies  .  Social History     Tobacco Use   Smoking Status Former   Smokeless Tobacco Never       Review of Systems  Review of Systems - negative except as per HPI      Physical Examination:  Visit Vitals  BP 110/82   Temp 97.3 F (36.3 C)   Ht 5' 11.5" (1.816 m)   Wt 230 lb (104.3 kg)   BMI 31.63 kg/m     Physical Exam    General: Appears in no acute distress  Lung-clear   Heart - reg  Abdomen -bs+ sof, tn   Extremities -     R  knee end flex minor pain   No swell, tender, or instabilty   Neurovascual intact   No pedal edema    Labs: No results found for this or any previous visit (from the past 8 hour(s)).    Marland Kitchen   Results for orders placed or performed in visit on 05/30/21   LIPID PANEL   Result Value Ref Range    Cholesterol, total 179 100 - 199 mg/dL    Triglyceride 209 (H) 0 - 149 mg/dL    HDL Cholesterol 38 (L) >39 mg/dL    VLDL, calculated 36 5 - 40 mg/dL    LDL, calculated 105 (H) 0 - 99 mg/dL   CBC WITH AUTOMATED DIFF   Result Value Ref Range    WBC 7.4 3.4 - 10.8 x10E3/uL    RBC 4.90 4.14 - 5.80  x10E6/uL    HGB 14.9 13.0 - 17.7 g/dL    HCT 43.7 37.5 - 51.0 %    MCV 89 79 - 97 fL    MCH 30.4 26.6 - 33.0 pg    MCHC 34.1 31.5 - 35.7 g/dL    RDW 12.9 11.6 - 15.4 %    PLATELET 220 150 - 450 x10E3/uL    NEUTROPHILS 61 Not Estab. %    Lymphocytes 26 Not Estab. %    MONOCYTES 10 Not Estab. %    EOSINOPHILS 2 Not Estab. %    BASOPHILS 1 Not Estab. %    ABS. NEUTROPHILS 4.5 1.4 - 7.0 x10E3/uL    Abs Lymphocytes 1.9 0.7 - 3.1 x10E3/uL    ABS. MONOCYTES 0.7 0.1 - 0.9 x10E3/uL    ABS. EOSINOPHILS 0.2 0.0 - 0.4 x10E3/uL    ABS. BASOPHILS 0.1 0.0 - 0.2 x10E3/uL    IMMATURE GRANULOCYTES 0 Not Estab. %    ABS. IMM. GRANS. 0.0 0.0 - 0.1 W11B1/YN   METABOLIC PANEL, COMPREHENSIVE   Result Value Ref Range    Glucose 89 70 - 99 mg/dL    BUN 15 8 - 27 mg/dL    Creatinine 0.97 0.76 - 1.27 mg/dL    eGFR 81 >59 mL/min/1.73    BUN/Creatinine ratio 15 10 - 24    Sodium 141 134 - 144 mmol/L    Potassium 4.5 3.5 - 5.2 mmol/L    Chloride 101 96 - 106 mmol/L    CO2 24 20 - 29 mmol/L    Calcium 9.6 8.6 - 10.2 mg/dL    Protein, total 7.2 6.0 - 8.5 g/dL    Albumin 4.9 (H) 3.7 - 4.7 g/dL    GLOBULIN, TOTAL 2.3 1.5 - 4.5 g/dL    A-G Ratio 2.1 1.2 - 2.2    Bilirubin, total 0.6 0.0 - 1.2 mg/dL    Alk. phosphatase 85 44 - 121 IU/L    AST (SGOT) 26 0 - 40 IU/L    ALT (SGPT) 29 0 - 44 IU/L        Assessment/Plan    ICD-10-CM ICD-9-CM    1. Right knee pain, unspecified  chronicity  M25.561 719.46       2. Primary osteoarthritis of right knee  M17.11 715.16       3. Essential hypertension  I10 401.9         Suspect R knee sprain superimposed on DJD.   P: rest,heat,  Tylenol , linament prn   Same rx htn good control  Follow up in 1 y for CPE or PRN.    Author:  Darlina Rumpf, MD 10:38 AM4/20/2023

## 2021-06-20 NOTE — Telephone Encounter (Signed)
Called pt ; answered his questions.  Call back here prn

## 2021-06-20 NOTE — Telephone Encounter (Signed)
Pt would like to discuss medications with the Dr     Ph# [phone]

## 2021-09-11 ENCOUNTER — Encounter

## 2021-09-16 ENCOUNTER — Inpatient Hospital Stay: Admit: 2021-09-16 | Payer: MEDICARE | Attending: Orthopaedic Surgery | Primary: Internal Medicine

## 2021-09-16 DIAGNOSIS — M1711 Unilateral primary osteoarthritis, right knee: Secondary | ICD-10-CM

## 2021-10-07 MED ORDER — DILTIAZEM HCL ER COATED BEADS 300 MG PO CP24
300 MG | ORAL_CAPSULE | ORAL | 3 refills | Status: AC
Start: 2021-10-07 — End: 2022-09-30

## 2021-10-18 IMAGING — DX DG CHEST 1V PORT
1 series · 1 of 1 positions shown · non-contrast
Comparison: CT 08/07/2017, plain film 08/07/2017

CLINICAL DATA: 73-year-old male with a history of chest tightness

EXAM:
PORTABLE CHEST 1 VIEW

[chest ap]
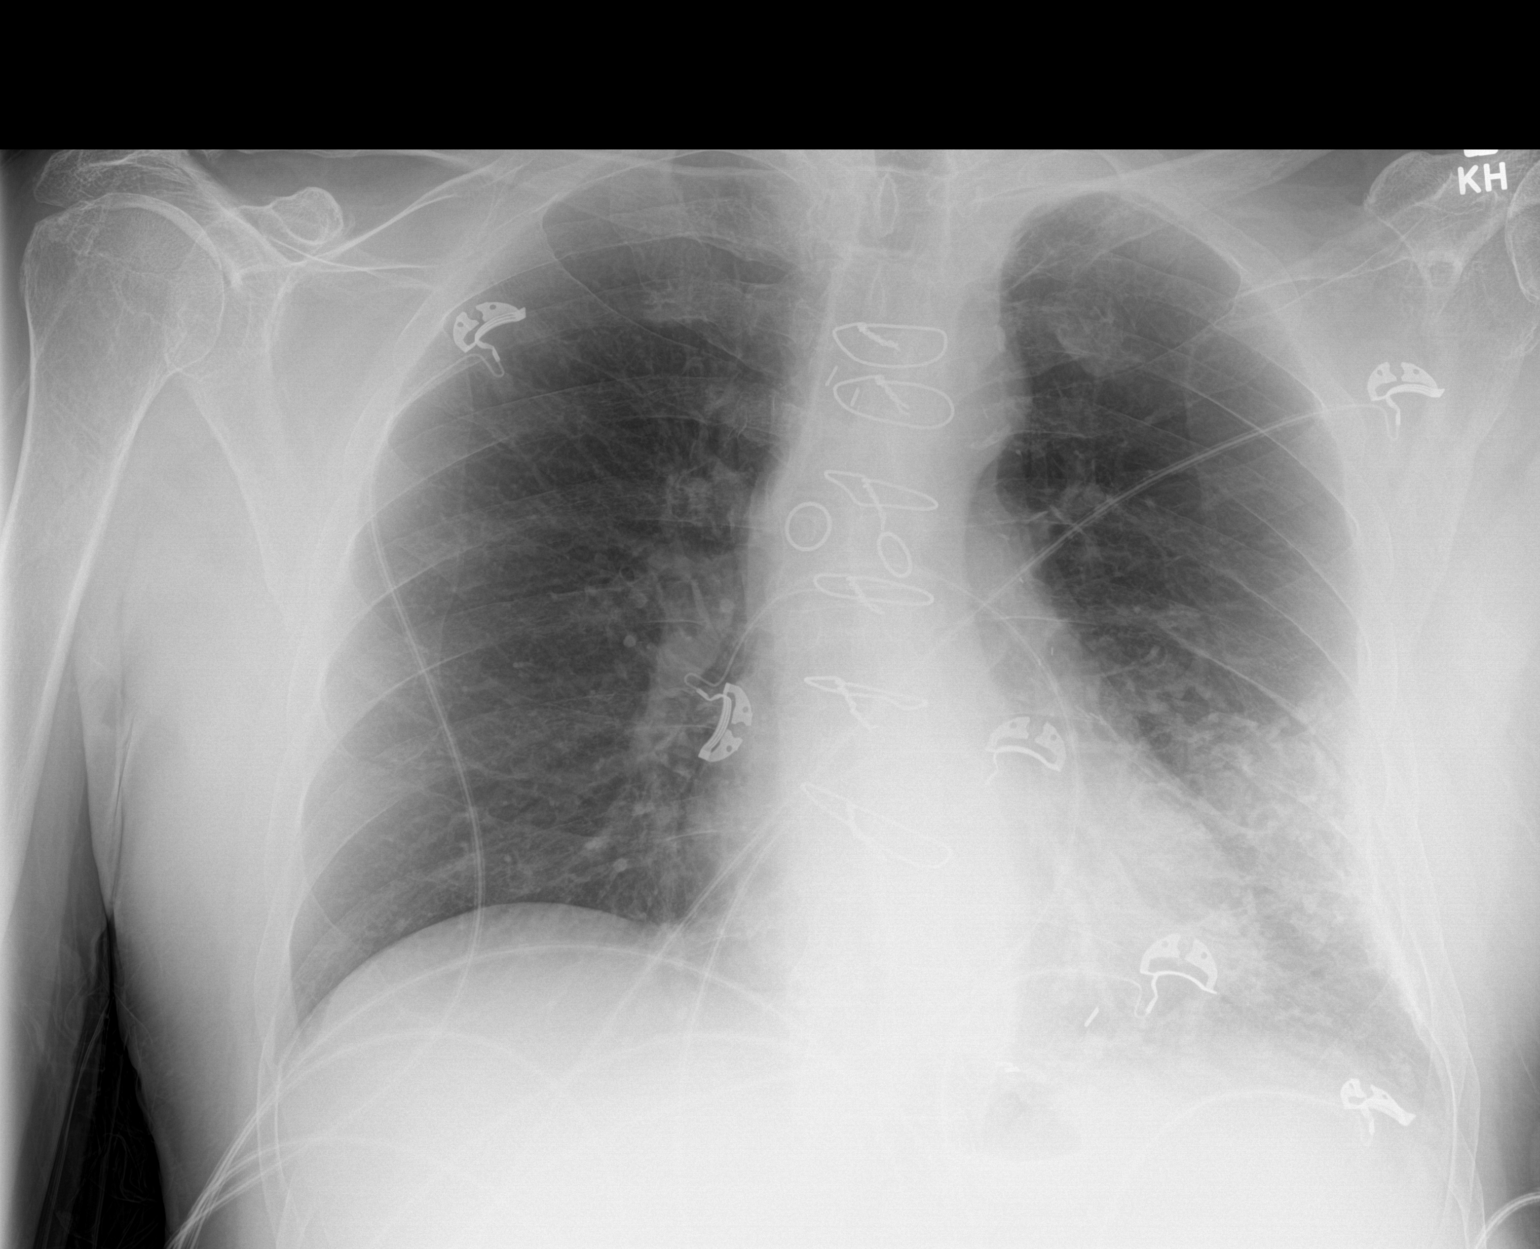

[1 of 1 positions shown; findings below may reference images not displayed]

FINDINGS: Cardiomediastinal silhouette unchanged in size and contour. Surgical
changes of median sternotomy and CABG.

No pneumothorax.

Reticular opacity of the left lower lobe with thickening of the
pleura compatible with known fibrothorax. Right lung clear. No
interlobular septal thickening or new confluent airspace disease.
IMPRESSION: Chronic changes including left fibrothorax without evidence of acute
cardiopulmonary disease.

Median sternotomy and CABG.

## 2021-11-12 MED ORDER — FLUOXETINE HCL 10 MG PO CAPS
10 MG | ORAL_CAPSULE | Freq: Every day | ORAL | 3 refills | Status: AC
Start: 2021-11-12 — End: ?

## 2022-01-13 NOTE — Progress Notes (Signed)
Formatting of this note is different from the original.  Images from the original note were not included.    CHIEF COMPLAINT and CHRONOLOGY    Right Knee Pain / Disability  HISTORY OF PRESENT ILLNESS:    Jeffrey Soto reports severe at times knee pain which is localized to the medial aspect of the knee and he describes the intensity as 4 / 10.  The patient states that their symptoms are not interfering with exercise and/or daily activities. Previous and/or current treatment includes physical therapy.  PHYSICAL EXAM :    KNEE EXAMINATION      Range of Motion : 5-110      Alignment : varus 5 degrees      Effusion: moderate varus      Ligamentous Stability : stable to testing      Joint Line Palpation : pain over the medial joint line      Patellar Tracking : abnormal, marked patellofemoral crepitus with flexion and extension      Gait : antalgic      Incision : none noted, no skin irregularities  RADIOGRAPHIC RESULTS / LABORATORY RESULTS:     No imaging obtained    ASSESSMENT:    (M17.11) Primary localized osteoarthritis of right knee  (primary encounter diagnosis)    PLAN :    Medical Decision Making and Discussion: The patient and I discussed the diagnosis and treatment options which include proceeding with total knee replacement and discussed post-operative expectations. We reviewed the diagnosis and all questions were answered.   Therapy recommendations: home exercise program   Medications this Visit: No medications were prescribed today  Medical Concerns or Issues: The patient is healthy, and did not report medical concerns or dental issues.   Follow-up: Jan prior to surgery.    CLINICAL ORDERS THIS VISIT :      Orders Placed This Encounter   Procedures   ? CT knee right without contrast (73700)      PAST MEDICAL HISTORY    has a past medical history of Depression, GERD (gastroesophageal reflux disease), Hyperlipidemia, Hypertension, and Osteoarthritis.    __________________     SURGICAL HISTORY      has a past  surgical history that includes No relevant surgeries and No relevant orthopaedic surgeries.  __________________    MEDICATIONS   has a current medication list which includes the following prescription(s): amoxicillin-clavulanate, atorvastatin, diltiazem cd, famotidine, fluoxetine, lisinopril, omeprazole otc, and tamsulosin.    __________________    ALLERGIES  has No Known Allergies.    __________________    REVIEW OF SYSTEMS    01/13/2022    Constitutional: Unexplained: Negative  Genitourinary: Frequent Urination: Negative  HEENT: Vision Loss: Negative  Neurological: Memory Loss: Negative  Integumentary: Rash: Negative  Cardiovascular: Palpatations: Negative  Hematologic: Bruises/Bleeds Easily: Positive  Gastrointestinal: Constipation: Negative  Immunological: Seasonal Allergies: Positive  Musculoskeletal: Joint Pain: Positive    __________________    Family History  Family History   Problem Relation Age of Onset   ? No Known Problems Mother    ? Emphysema Father    ? No Known Problems Brother    ? No Known Problems Sister    ? No Known Problems Son    ? Diabetes Daughter    ? No Known Problems Other              Electronically signed by Reggie Pile, MD at 01/14/2022  9:28 PM EST

## 2022-01-19 ENCOUNTER — Encounter

## 2022-01-19 MED ORDER — FAMOTIDINE 20 MG PO TABS
20 MG | ORAL_TABLET | Freq: Two times a day (BID) | ORAL | 3 refills | Status: DC
Start: 2022-01-19 — End: 2023-04-09

## 2022-03-25 ENCOUNTER — Inpatient Hospital Stay: Payer: MEDICARE | Attending: Gastroenterology

## 2022-03-25 MED ORDER — SODIUM CHLORIDE 0.9 % IV SOLN
0.9 % | INTRAVENOUS | Status: DC | PRN
Start: 2022-03-25 — End: 2022-03-25

## 2022-03-25 MED ORDER — LIDOCAINE HCL (PF) 2 % IJ SOLN
2 % | INTRAMUSCULAR | Status: DC | PRN
Start: 2022-03-25 — End: 2022-03-25
  Administered 2022-03-25: 14:00:00 40 via INTRAVENOUS

## 2022-03-25 MED ORDER — PROPOFOL 100 MG/10ML IV EMUL
100 MG/10ML | INTRAVENOUS | Status: DC | PRN
Start: 2022-03-25 — End: 2022-03-25
  Administered 2022-03-25 (×5): 50 via INTRAVENOUS

## 2022-03-25 MED ORDER — SODIUM CHLORIDE 0.9 % IV SOLN
0.9 % | INTRAVENOUS | Status: DC
Start: 2022-03-25 — End: 2022-03-25

## 2022-03-25 MED ORDER — SODIUM CHLORIDE 0.9 % IV SOLN
0.9 % | INTRAVENOUS | Status: DC | PRN
Start: 2022-03-25 — End: 2022-03-25
  Administered 2022-03-25: 14:00:00 via INTRAVENOUS

## 2022-03-25 MED ORDER — NORMAL SALINE FLUSH 0.9 % IV SOLN
0.9 % | Freq: Two times a day (BID) | INTRAVENOUS | Status: DC
Start: 2022-03-25 — End: 2022-03-25

## 2022-03-25 MED ORDER — NORMAL SALINE FLUSH 0.9 % IV SOLN
0.9 % | INTRAVENOUS | Status: DC | PRN
Start: 2022-03-25 — End: 2022-03-25

## 2022-03-25 MED FILL — NORMAL SALINE FLUSH 0.9 % IV SOLN: 0.9 % | INTRAVENOUS | Qty: 40

## 2022-03-25 MED FILL — SODIUM CHLORIDE 0.9 % IV SOLN: 0.9 % | INTRAVENOUS | Qty: 1000

## 2022-03-25 NOTE — Discharge Instructions (Addendum)
[facility]  [address] [place], [place]  [place]  [id number]  1945-07-23    COLON DISCHARGE INSTRUCTIONS    DISCOMFORT:  Redness at IV site- apply warm compress to area; if redness or soreness persist- contact your physician  There may be a slight amount of blood passed from the rectum  Gaseous discomfort- walking, belching will help relieve any discomfort      DIET:   High fiber diet.   - however -  remember your colon is empty and a heavy meal will produce gas.   Avoid these foods:  vegetables, fried / greasy foods, carbonated drinks for today  You may not  drink alcoholic beverages for at least 12 hours     ACTIVITY:  It is recommended that you spend the remainder of the day resting -  avoid any strenuous activity.  You may not operate a vehicle for 12 hours  You may not  engage in an occupation involving machinery or appliances for rest of today    Avoid making any critical decisions for at least 24 hour    CALL M.D.  ANY SIGN OF:   Increasing pain, nausea, vomiting  Abdominal distension (swelling)  New increased bleeding (oral or rectal)  Fever (chills)  Pain in chest area  Bloody discharge from nose or mouth  Shortness of breath    You may not  take any Advil, Aspirin, Ibuprofen, Motrin, Aleve, or Goody's for 7 days, ONLY  Tylenol as needed for pain.    Post procedure diagnosis:   Colon polyps  Internal hemorrhoids    Post-procedure recommendations:  -Await pathology.  -High fiber diet.  -No further colonoscopy needed for screening purposes  -Resume normal medication(s).  -NO aspirin for 7 days     Follow-up Instructions:    Call Dr. Doristine Johns for any questions or problems.     If we took a biopsy please call the office within 2 weeks to discuss your  pathology results. Telephone # [phone]          Colon Polyps: Care Instructions  Your Care Instructions     Colon polyps are growths in the colon or the rectum. The cause  of most colon polyps is not known, and most people who get them do not have any problems. But a certain kind can turn into cancer. For this reason, regular testing for colon polyps is important for people as they get older. It is also important for anyone who has an increased risk for colon cancer.  Polyps are usually found through routine colon cancer screening tests. Although most colon polyps are not cancerous, they are usually removed and then tested for cancer. Screening for colon cancer saves lives because the cancer can usually be cured if it is caught early.  If you have a polyp that is the type that can turn into cancer, you may need more tests to examine your entire colon. The doctor will remove any other polyps that are found, and you will be tested more often.  Follow-up care is a key part of your treatment and safety. Be sure to make and go to all appointments, and call your doctor if you are having problems. It's also a good idea to know  your test results and keep a list of the medicines you take.  How can you care for yourself at home?  Regular exams to look for colon polyps are the best way to prevent polyps from turning into colon cancer. These can include stool tests, sigmoidoscopy, colonoscopy, and CT colonography. Talk with your doctor about a testing schedule that is right for you.  To prevent polyps  There is no home treatment that can prevent colon polyps. But these steps may help lower your risk for cancer.  Stay active. Being active can help you get to and stay at a healthy weight. Try to exercise on most days of the week. Walking is a good choice.  Eat well. Choose a variety of vegetables, fruits, legumes (such as peas and beans), fish, poultry, and whole grains.  Do not smoke. If you need help quitting, talk to your doctor about stop-smoking programs and medicines. These can increase your chances of quitting for good.  If you drink alcohol, limit how much you drink. Limit alcohol to 2  drinks a day for men and 1 drink a day for women.  When should you call for help?   Call your doctor now or seek immediate medical care if:    You have severe belly pain.     Your stools are maroon or very bloody.   Watch closely for changes in your health, and be sure to contact your doctor if:    You have a fever.     You have nausea or vomiting.     You have a change in bowel habits (new constipation or diarrhea).     Your symptoms get worse or are not improving as expected.   Where can you learn more?  Go to https://www.bennett.info/ and enter C571 to learn more about "Colon Polyps: Care Instructions."  Current as of: May 20, 2021               Content Version: 13.9   2006-2023 [employer].   Care instructions adapted under license by [facility]. If you have questions about a medical condition or this instruction, always ask your healthcare professional. [place] any warranty or liability for your use of this information.

## 2022-03-25 NOTE — Op Note (Signed)
[facility]  [address] [place], [place]  [phone]                              Colonoscopy Procedure Note      Indications:    Constipation, Personal history of colon polyps (screening only)     Operator:  Ruthe Mannan, MD    Surgical Assistant: Circulator: Maudie Flakes, RN  Endoscopy Technician: Rodney Booze    Implants: none    Referring Provider: Smith Robert, MD    Sedation:  MAC anesthesia    Procedure Details:  After informed consent was obtained with all risks and benefits of procedure explained and preoperative exam completed, the patient was taken to the endoscopy suite and placed in the left lateral decubitus position.  Upon sequential sedation as per above, a digital rectal exam was performed  And was normal.  The Olympus videocolonoscope  was inserted in the rectum and carefully advanced to the cecum, which was identified by the ileocecal valve and appendiceal orifice.  The quality of preparation was poor.  The colonoscope was slowly withdrawn with careful evaluation between folds. Retroflexion in the rectum was performed and was normal..     Findings:   Rectum: no mucosal lesion appreciated  Grade 1 internal hemorrhoid(s);  Sigmoid: no mucosal lesion appreciated  Descending Colon: 1  Sessile polyp(s), the largest 3 mm in size;  Transverse Colon: no mucosal lesion appreciated  Ascending Colon: no mucosal lesion appreciated  Cecum: 1  Sessile polyp(s), the largest 5 mm in size;  Terminal Ileum: not intubated    Interventions:  2 complete polypectomy were performed using cold snare  and cold biopsy forceps and the polyps were  retrieved    Specimen Removed:    ID Type Source Tests Collected by Time Destination   1 : Cecum Polyp Tissue Cecum SURGICAL PATHOLOGY Ruthe Mannan, MD 03/25/2022 [phone]    2 : Descending Colon Polyp Tissue Colon-Descending SURGICAL PATHOLOGY Ruthe Mannan, MD 03/25/2022 [id number]        Complications: None.     EBL:   None.    Recommendations:   -Await pathology.  -High fiber diet.  -No further colonoscopy needed for screening purposes  -Resume normal medication(s).  -NO aspirin for 7 days     Discharge Disposition:  Home in the company of a driver when able to ambulate.    Ruthe Mannan, MD  03/25/2022  9:41 AM

## 2022-03-25 NOTE — Anesthesia Post-Procedure Evaluation (Signed)
Department of Anesthesiology  Postprocedure Note    Patient: Jeffrey Soto  MRN: [id number]  Birthdate: Apr 10, 1945  Date of evaluation: 03/25/2022    Procedure Summary       Date: 03/25/22 Room / Location: [facility] ENDO 06 / [place] [facility]    Anesthesia Start: 0913 Anesthesia Stop: 0937    Procedure: COLONOSCOPY DIAGNOSTIC (Lower GI Region) Diagnosis:       Personal history of colonic polyps      (Personal history of colonic polyps [Z86.010])    Surgeons: Ruthe Mannan, MD Responsible Provider: Junie Panning, MD    Anesthesia Type: MAC ASA Status: 2            Anesthesia Type: MAC    Aldrete Phase I: Aldrete Score: 10    Aldrete Phase II: Aldrete Score: 10    Anesthesia Post Evaluation    Patient location during evaluation: PACU  Patient participation: complete - patient participated  Level of consciousness: awake and alert  Airway patency: patent  Nausea & Vomiting: no nausea and no vomiting  Cardiovascular status: hemodynamically stable  Respiratory status: acceptable  Hydration status: stable    No notable events documented.

## 2022-03-25 NOTE — Progress Notes (Signed)
Endoscopy recovery  Patient returned to baseline, vital signs stable (see vital sign flowsheet). Patient offered liquids and tolerated well. Respiratory status within defined limits. Abdomen soft not tender. Skin with in defined limits. Responsible party driving patient home was given the opportunity to ask questions. Patient discharged with documented belongings.

## 2022-03-25 NOTE — H&P (Signed)
[facility]  [address] [place]  [place], [place]  [phone]                                History and Physical     NAME: Jeffrey Soto   DOB:  [birth year]   MRN:  [id number]     HPI:  The patient was seen and examined.    Past Surgical History:   Procedure Laterality Date    COLONOSCOPY  06/26/03    diverticulosis, polypectomy, f/u dr. Gerald Dexter     GI      right inguinal hernia repair x3    OTHER SURGICAL HISTORY      removal pilonidal cyst    UROLOGICAL SURGERY  05/25/2017    Prostate biopsy by K. Davis Gourd MD    UROLOGICAL SURGERY      right orchiectomy for benign growth     Past Medical History:   Diagnosis Date    Arrhythmia     Hx PAT. dr. Ezequiel Ganser evaluated.     Depression     Elevated lipids 12/16/2009    Fatty liver     Gallbladder polyp     GERD (gastroesophageal reflux disease) 06/26/2003    EGD: duodenitis , dilation of Esophageal ring , DR. [name]    HTN (hypertension) 12/16/2009    Prostate cancer ([place]) 06/02/2017    GLeason 3+ 3= 6 . 2019 . Dr. Ceasar Mons     Social History     Tobacco Use    Smoking status: Former    Smokeless tobacco: Never   Substance Use Topics    Alcohol use: Yes     No Known Allergies  Family History   Problem Relation Age of Onset    Heart Disease Brother 69        sudden death  in sleep    Cancer Sister         lung    Cancer Sister         lung    Lung Disease Father         emphysema     No current facility-administered medications for this encounter.         PHYSICAL EXAM:  General: WD, WN. Alert, cooperative, no acute distress    HEENT: NC, Atraumatic.  PERRLA, EOMI. Anicteric sclerae.  Lungs:  CTA Bilaterally. No Wheezing/Rhonchi/Rales.  Heart:  Regular  rhythm,  No murmur, No Rubs, No Gallops  Abdomen: Soft, Non distended, Non tender.  +Bowel sounds, no HSM  Extremities: No c/c/e  Neurologic:  CN 2-12 gi, Alert and oriented X 3.  No acute neurological distress   Psych:   Good insight. Not anxious nor agitated.    The heart, lungs and mental status  were satisfactory for the administration of MAC sedation and for the procedure.      Mallampati score: 2     The patient was counseled at length about the risks of contracting Covid-19 in the peri-operative and post-operative states including the recovery window of their procedure.  The patient was made aware that contracting Covid-19 after a surgical procedure may worsen their prognosis for recovering from the virus and lend to a higher morbidity and or mortality risk.  The patient was given the options of postponing their procedure. All of the risks, benefits, and alternatives were discussed. The patient does wish to proceed with the procedure.  Assessment:   Constipation    Plan:   Endoscopic procedure  MAC sedation

## 2022-03-25 NOTE — Progress Notes (Signed)
Verified patient name and date of birth, scheduled procedure, and informed consent. Reviewed general discharge instructions and driver information.  Assessed patient. Awake, alert, and oriented per baseline. Vital signs stable (see vital sign flowsheet). Respiratory status within defined limits, abdomen soft and non tender. Skin with in defined limits.

## 2022-03-25 NOTE — Anesthesia Pre-Procedure Evaluation (Addendum)
Department of Anesthesiology  Preprocedure Note       Name:  Jeffrey Soto   Age:  77 y.o.  DOB:  1945-05-02                                          MRN:  [id number]         Date:  03/25/2022      Surgeon: Juliann Mule):  Ruthe Mannan, MD    Procedure: Procedure(s):  COLONOSCOPY DIAGNOSTIC    Medications prior to admission:   Prior to Admission medications    Medication Sig Start Date End Date Taking? Authorizing Provider   famotidine (PEPCID) 20 MG tablet TAKE ONE TABLET BY MOUTH TWICE A DAY 01/19/22   Reina Fuse IV, MD   FLUoxetine (PROZAC) 10 MG capsule Take 1 capsule by mouth daily Take 1 capsule by mouth daily 11/12/21   Reina Fuse IV, MD   dilTIAZem (CARDIZEM CD) 300 MG extended release capsule TAKE ONE CAPSULE BY MOUTH DAILY 10/07/21   Reina Fuse IV, MD   diphenhydrAMINE (BENADRYL) 50 MG tablet Take 1 tablet by mouth nightly    Automatic Reconciliation, Ar   lansoprazole (PREVACID) 15 MG delayed release capsule Take by mouth every morning (before breakfast)    Automatic Reconciliation, Ar   lisinopril (PRINIVIL;ZESTRIL) 40 MG tablet Take 1 tablet by mouth daily 05/30/21   Automatic Reconciliation, Ar   tamsulosin (FLOMAX) 0.4 MG capsule Take 1 capsule by mouth    Automatic Reconciliation, Ar   atorvastatin (LIPITOR) 20 MG tablet 1 tablet by mouth daily 05/05/21   Automatic Reconciliation, Ar   dilTIAZem (TIAZAC) 300 MG extended release capsule Take 1 capsule by mouth daily 09/27/14   Automatic Reconciliation, Ar       Current medications:    No current facility-administered medications for this encounter.     Current Outpatient Medications   Medication Sig Dispense Refill   . famotidine (PEPCID) 20 MG tablet TAKE ONE TABLET BY MOUTH TWICE A DAY 60 tablet 3   . FLUoxetine (PROZAC) 10 MG capsule Take 1 capsule by mouth daily Take 1 capsule by mouth daily 90 capsule 3   . dilTIAZem (CARDIZEM CD) 300 MG extended release capsule TAKE ONE CAPSULE BY MOUTH DAILY 90 capsule 3   . diphenhydrAMINE (BENADRYL)  50 MG tablet Take 1 tablet by mouth nightly     . lansoprazole (PREVACID) 15 MG delayed release capsule Take by mouth every morning (before breakfast)     . lisinopril (PRINIVIL;ZESTRIL) 40 MG tablet Take 1 tablet by mouth daily     . tamsulosin (FLOMAX) 0.4 MG capsule Take 1 capsule by mouth     . atorvastatin (LIPITOR) 20 MG tablet 1 tablet by mouth daily     . dilTIAZem (TIAZAC) 300 MG extended release capsule Take 1 capsule by mouth daily         Allergies:  Not on File    Problem List:    Patient Active Problem List   Diagnosis Code   . Elevated lipids E78.5   . Fatty liver K76.0   . Prostate cancer ([place]) C61   . Elevated PSA R97.20   . HTN (hypertension) I10   . GERD (gastroesophageal reflux disease) K21.9       Past Medical History:        Diagnosis Date   . Arrhythmia  Hx PAT. dr. Ezequiel Ganser evaluated.    . Depression    . Elevated lipids 12/16/2009   . Fatty liver    . Gallbladder polyp    . GERD (gastroesophageal reflux disease) 06/26/2003    EGD: duodenitis , dilation of Esophageal ring , DR. Gerald Dexter   . HTN (hypertension) 12/16/2009   . Prostate cancer ([place]) 06/02/2017    GLeason 3+ 3= 6 . 2019 . Dr. Ceasar Mons       Past Surgical History:        Procedure Laterality Date   . COLONOSCOPY  06/26/03    diverticulosis, polypectomy, f/u dr. Gerald Dexter    . GI      right inguinal hernia repair x3   . OTHER SURGICAL HISTORY      removal pilonidal cyst   . UROLOGICAL SURGERY  05/25/2017    Prostate biopsy by K. Davis Gourd MD   . UROLOGICAL SURGERY      right orchiectomy for benign growth       Social History:    Social History     Tobacco Use   . Smoking status: Former   . Smokeless tobacco: Never   Substance Use Topics   . Alcohol use: Yes                                Counseling given: Not Answered      Vital Signs (Current): There were no vitals filed for this visit.                                           BP Readings from Last 3 Encounters:   06/19/21 110/82   05/30/21 (!) 142/84   05/03/20 139/80       NPO  Status:                                                                                 BMI:   Wt Readings from Last 3 Encounters:   06/19/21 104.3 kg (230 lb)   05/30/21 104.3 kg (230 lb)   05/03/20 99.8 kg (220 lb)     There is no height or weight on file to calculate BMI.    CBC:   Lab Results   Component Value Date/Time    WBC 7.4 05/30/2021 03:03 PM    RBC 4.90 05/30/2021 03:03 PM    HGB 14.9 05/30/2021 03:03 PM    HCT 43.7 05/30/2021 03:03 PM    MCV 89 05/30/2021 03:03 PM    MCV 86.4 11/01/2018 03:03 PM    RDW 12.9 05/30/2021 03:03 PM    PLT 220 05/30/2021 03:03 PM       CMP:   Lab Results   Component Value Date/Time    NA 141 05/30/2021 03:03 PM    K 4.5 05/30/2021 03:03 PM    CL 101 05/30/2021 03:03 PM    CO2 24 05/30/2021 03:03 PM    BUN 15 05/30/2021 03:03 PM    CREATININE 0.97 05/30/2021 03:03 PM  GFRAA 91 11/28/2019 04:20 PM    AGRATIO 2.1 05/30/2021 03:03 PM    LABGLOM 81 05/30/2021 03:03 PM    GLUCOSE 89 05/30/2021 03:03 PM    PROT 7.2 05/30/2021 03:03 PM    CALCIUM 9.6 05/30/2021 03:03 PM    BILITOT 0.6 05/30/2021 03:03 PM    ALKPHOS 85 05/30/2021 03:03 PM    AST 26 05/30/2021 03:03 PM    ALT 29 05/30/2021 03:03 PM       POC Tests: No results for input(s): "POCGLU", "POCNA", "POCK", "POCCL", "POCBUN", "POCHEMO", "POCHCT" in the last 72 hours.    Coags: No results found for: "PROTIME", "INR", "APTT"    HCG (If Applicable): No results found for: "PREGTESTUR", "PREGSERUM", "HCG", "HCGQUANT"     ABGs: No results found for: "PHART", "PO2ART", "PCO2ART", "HCO3ART", "BEART", "O2SATART"     Type & Screen (If Applicable):  No results found for: "LABABO", "LABRH"    Drug/Infectious Status (If Applicable):  No results found for: "HIV", "HEPCAB"    COVID-19 Screening (If Applicable): No results found for: "COVID19"        Anesthesia Evaluation  Patient summary reviewed and Nursing notes reviewed   no history of anesthetic complications:   Airway: Mallampati: II  TM distance: >3 FB   Neck ROM: full  Mouth  opening: > = 3 FB   Dental: normal exam         Pulmonary:normal exam  breath sounds clear to auscultation                            ROS comment: Smoking Status: Former   Cardiovascular:    (+) hypertension:, hyperlipidemia        Rhythm: regular  Rate: normal                    Neuro/Psych:   (+) psychiatric history:depression/anxiety             GI/Hepatic/Renal:   (+) GERD:, liver disease:          Endo/Other:    (+) malignancy/cancer (Prostate CA).                 Abdominal: normal exam            Vascular: negative vascular ROS.         Other Findings:         Anesthesia Plan      MAC     ASA 2       Induction: intravenous.      Anesthetic plan and risks discussed with patient.      Plan discussed with CRNA.                  Junie Panning, MD   03/25/2022

## 2022-05-04 MED ORDER — ATORVASTATIN CALCIUM 20 MG PO TABS
20 MG | ORAL_TABLET | Freq: Every day | ORAL | 3 refills | Status: AC
Start: 2022-05-04 — End: 2022-11-23

## 2022-06-02 MED ORDER — LISINOPRIL 40 MG PO TABS
40 MG | ORAL_TABLET | Freq: Every day | ORAL | 0 refills | Status: AC
Start: 2022-06-02 — End: 2022-08-27

## 2022-06-25 ENCOUNTER — Encounter: Attending: Internal Medicine | Primary: Internal Medicine

## 2022-06-25 ENCOUNTER — Ambulatory Visit: Attending: Internal Medicine | Primary: Internal Medicine

## 2022-06-25 NOTE — Progress Notes (Signed)
No show

## 2022-06-30 ENCOUNTER — Ambulatory Visit: Admit: 2022-06-30 | Payer: MEDICARE | Attending: Internal Medicine | Primary: Internal Medicine

## 2022-06-30 DIAGNOSIS — M7989 Other specified soft tissue disorders: Secondary | ICD-10-CM

## 2022-06-30 LAB — AMB POC URINALYSIS DIP STICK AUTO W/O MICRO
Bilirubin, Urine, POC: NEGATIVE
Blood, Urine, POC: NEGATIVE
Glucose, Urine, POC: NEGATIVE
Ketones, Urine, POC: NEGATIVE
Leukocyte Esterase, Urine, POC: NEGATIVE
Nitrite, Urine, POC: NEGATIVE
Protein, Urine, POC: NEGATIVE
Specific Gravity, Urine, POC: 1.02 (ref 1.001–1.035)
Urobilinogen, POC: 0.2
pH, Urine, POC: 7 (ref 4.6–8.0)

## 2022-06-30 NOTE — Progress Notes (Unsigned)
Jeffrey Soto is a 77 y.o. male     SUBJECTIVE:    6 weeks b/l feet sweeling ; no nausea, sob. On Flomax urinary steram varei  Past Medical History:   Diagnosis Date    Arrhythmia     Hx PAT. dr. [name] evaluated.     Depression     Elevated lipids 12/16/2009    Fatty liver     Gallbladder polyp     GERD (gastroesophageal reflux disease) 06/26/2003    EGD: duodenitis , dilation of Esophageal ring , DR. [name]    HTN (hypertension) 12/16/2009    Prostate cancer ([facility]) 06/02/2017    GLeason 3+ 3= 6 . 2019 . Dr. [name]     Current Outpatient Medications   Medication Sig Dispense Refill    lisinopril (PRINIVIL;ZESTRIL) 40 MG tablet TAKE ONE TABLET BY MOUTH ONE TIME A DAY 90 tablet 0    atorvastatin (LIPITOR) 20 MG tablet TAKE ONE TABLET BY MOUTH DAILY 90 tablet 3    famotidine (PEPCID) 20 MG tablet TAKE ONE TABLET BY MOUTH TWICE A DAY 60 tablet 3    FLUoxetine (PROZAC) 10 MG capsule Take 1 capsule by mouth daily Take 1 capsule by mouth daily 90 capsule 3    dilTIAZem (CARDIZEM CD) 300 MG extended release capsule TAKE ONE CAPSULE BY MOUTH DAILY 90 capsule 3    diphenhydrAMINE (BENADRYL) 50 MG tablet Take 1 tablet by mouth nightly      lansoprazole (PREVACID) 15 MG delayed release capsule Take by mouth every morning (before breakfast)      tamsulosin (FLOMAX) 0.4 MG capsule Take 1 capsule by mouth      dilTIAZem (TIAZAC) 300 MG extended release capsule Take 1 capsule by mouth daily       No current facility-administered medications for this visit.     No Known Allergies  Social History     Tobacco Use   Smoking Status Former   Smokeless Tobacco Never     The patient {QM PQRS Fall Risk PQRS 154 & [id number] a history of falls. A plan of care for falls {QM PQRS [facility] of Care PQRS [id number]}.  Depression screen positive,  , {Depression Follow-Up:[id number]::"C-SSRS completed"}.    Review of Systems  Review of Systems -  wears hearing aids ; then 3 mos ago r ear hearing loss  serous Otitis media  tx'd  [facility]  steroid nasal flush  myringotomy tube .  2 months cough improved debues rybbt nose indidgwo      Physical Examination:  Temp 98.2 F (36.8 C)   Ht 1.816 m (5' 11.5")   Wt 104.3 kg (230 lb)   BMI 31.63 kg/m   Physical Exam    General: Appears in no acute distress  138/88  R  ear tube   B/l tm's clear     Throat clear     Lung-clear  Heart - RRR Kassadi Presswood/o mgr   Abdomen -eg  Extremities - trace preibi edam  L hand purpurs     Labs: No results found for this or any previous visit (from the past 8 hour(s)).    No results found for this visit on 06/30/22.     Assessment/Plan  {No diagnosis found. (Refresh or delete this SmartLink)}    ***  Follow up in or PRN.    Author:  Nadara Mode, MD 9:03 AM4/30/2024

## 2022-07-01 LAB — CBC WITH AUTO DIFFERENTIAL
Basophils %: 1 %
Basophils Absolute: 0.1 10*3/uL (ref 0.0–0.2)
Eosinophils %: 4 %
Eosinophils Absolute: 0.3 10*3/uL (ref 0.0–0.4)
Hematocrit: 43 % (ref 37.5–51.0)
Hemoglobin: 14 g/dL (ref 13.0–17.7)
Immature Grans (Abs): 0 10*3/uL (ref 0.0–0.1)
Immature Granulocytes %: 0 %
Lymphocytes %: 19 %
Lymphocytes Absolute: 1.4 10*3/uL (ref 0.7–3.1)
MCH: 29 pg (ref 26.6–33.0)
MCHC: 32.6 g/dL (ref 31.5–35.7)
MCV: 89 fL (ref 79–97)
Monocytes %: 9 %
Monocytes Absolute: 0.7 10*3/uL (ref 0.1–0.9)
Neutrophils %: 67 %
Neutrophils Absolute: 4.9 10*3/uL (ref 1.4–7.0)
Platelets: 255 10*3/uL (ref 150–450)
RBC: 4.82 x10E6/uL (ref 4.14–5.80)
RDW: 13 % (ref 11.6–15.4)
WBC: 7.4 10*3/uL (ref 3.4–10.8)

## 2022-07-01 LAB — COMPREHENSIVE METABOLIC PANEL
ALT: 27 IU/L (ref 0–44)
AST: 30 IU/L (ref 0–40)
Albumin/Globulin Ratio: 1.9 (ref 1.2–2.2)
Albumin: 4.5 g/dL (ref 3.8–4.8)
Alkaline Phosphatase: 76 IU/L (ref 44–121)
BUN/Creatinine Ratio: 19 (ref 10–24)
BUN: 15 mg/dL (ref 8–27)
CO2: 20 mmol/L (ref 20–29)
Calcium: 9.3 mg/dL (ref 8.6–10.2)
Chloride: 103 mmol/L (ref 96–106)
Creatinine: 0.81 mg/dL (ref 0.76–1.27)
Est, Glom Filt Rate: 91 mL/min/{1.73_m2} (ref >59)
Globulin, Total: 2.4 g/dL (ref 1.5–4.5)
Glucose: 118 mg/dL — ABNORMAL HIGH (ref 70–99)
Potassium: 4.5 mmol/L (ref 3.5–5.2)
Sodium: 139 mmol/L (ref 134–144)
Total Bilirubin: 0.5 mg/dL (ref 0.0–1.2)
Total Protein: 6.9 g/dL (ref 6.0–8.5)

## 2022-07-01 LAB — T4, FREE: T4 Free: 1.26 ng/dL (ref 0.82–1.77)

## 2022-07-01 LAB — TSH: TSH: 2.85 u[IU]/mL (ref 0.450–4.500)

## 2022-07-01 LAB — CK: Total CK: 187 U/L (ref 41–331)

## 2022-07-14 MED ORDER — FUROSEMIDE 20 MG PO TABS
20 MG | ORAL_TABLET | ORAL | 0 refills | Status: AC
Start: 2022-07-14 — End: 2022-07-31

## 2022-07-14 NOTE — Progress Notes (Signed)
Called pt Jeffrey Soto/ recent  lab results. LE edema now a little better. ERX Lasix 20 mg qod prn edema . Call back [name]/ progress

## 2022-07-30 ENCOUNTER — Ambulatory Visit: Admit: 2022-07-30 | Payer: MEDICARE | Attending: Internal Medicine | Primary: Internal Medicine

## 2022-07-30 DIAGNOSIS — M7551 Bursitis of right shoulder: Secondary | ICD-10-CM

## 2022-07-30 MED ORDER — TRIAMCINOLONE ACETONIDE 40 MG/ML IJ SUSP
40 MG/ML | Freq: Once | INTRAMUSCULAR | Status: AC
Start: 2022-07-30 — End: 2022-11-23

## 2022-07-30 NOTE — Patient Instructions (Signed)
Gentle shoulder exercises   Elevate legs   Avoid salt   Call here week of June 17 with update on leg swelling or sooner as needed

## 2022-07-30 NOTE — Progress Notes (Signed)
Jeffrey Soto is a 77 y.o. Jeffrey Soto/m CC: b/l shoulder pain     SUBJECTIVE:    B/l shoulder bursitis Jeffrey Soto/ increased pain recently R> L  shoulder    Also b/l LE edema unchanged Jeffrey Soto/ Lasix prn . Few days ago I advised stop Atorvastatin empirically     Past Medical History:   Diagnosis Date    Arrhythmia     Hx PAT. Jeffrey. Canary Soto evaluated.     Bursitis of both shoulders     Depression     Elevated lipids 12/16/2009    Fatty liver     Gallbladder polyp     GERD (gastroesophageal reflux disease) 06/26/2003    EGD: duodenitis , dilation of Esophageal ring , Jeffrey. [name]    HTN (hypertension) 12/16/2009    Prostate cancer ([facility]) 06/02/2017    GLeason 3+ 3= 6 . 2019 . Jeffrey. Steele Soto     Current Outpatient Medications   Medication Sig Dispense Refill    furosemide (LASIX) 20 MG tablet One pill every other morning as needed for leg swelling 10 tablet 0    omeprazole (PRILOSEC) 20 MG delayed release capsule Take 1 capsule by mouth daily      lisinopril (PRINIVIL;ZESTRIL) 40 MG tablet TAKE ONE TABLET BY MOUTH ONE TIME A DAY 90 tablet 0    atorvastatin (LIPITOR) 20 MG tablet TAKE ONE TABLET BY MOUTH DAILY 90 tablet 3    famotidine (PEPCID) 20 MG tablet TAKE ONE TABLET BY MOUTH TWICE A DAY 60 tablet 3    FLUoxetine (PROZAC) 10 MG capsule Take 1 capsule by mouth daily Take 1 capsule by mouth daily 90 capsule 3    dilTIAZem (CARDIZEM CD) 300 MG extended release capsule TAKE ONE CAPSULE BY MOUTH DAILY 90 capsule 3    diphenhydrAMINE (BENADRYL) 50 MG tablet Take 1 tablet by mouth nightly      tamsulosin (FLOMAX) 0.4 MG capsule Take 1 capsule by mouth      dilTIAZem (TIAZAC) 300 MG extended release capsule Take 1 capsule by mouth daily       No current facility-administered medications for this visit.     No Known Allergies  Social History     Tobacco Use   Smoking Status Former   Smokeless Tobacco Never         Review of Systems  Review of Systems - negative except as per HPI  R knee OA f/b Jeffrey Soto. Intermittent pain       Physical Examination:  BP  120/72   Pulse 64   Temp 97.2 F (36.2 C)   Ht 1.816 m (5' 11.5")   Wt 107 kg (236 lb)   BMI 32.46 kg/m   Physical Exam    General: Appears in no acute distress  Lung-clear  Heart - reg Jeffrey Soto/o mgr  Abdomen -neg  Extremities - + impingement sign b/l   Mild b/l shoulder decr ROm   O/Jeffrey Soto arms neurovascular intact     R knee slight decreased  flexion ; no instability   B/l 1+ pretib edema      Labs: No results found for this or any previous visit (from the past 8 hour(s)).    No results found for this visit on 07/30/22.     Assessment/Plan   Diagnosis Orders   1. Bursitis of right shoulder        2. Bursitis of left shoulder          After discussion , each shoulder injected  under sterile conditions Jeffrey Soto/ Kenalog 40 mg and Lidocaine 1% . No immediate complications noted. Afterwards, sterile dressing applies b/l shoulders . Advised , demostrated gentle shoulder stretches. Advised rest for  a few day. Use Tylenol prn     LE edema. Stay off Atorvastatin empirically and call me back in  a few weeks   Avoid salt.     Follow up  PRN.      Author:  Nadara Mode, MD 7:30 PM5/30/2024

## 2022-07-31 MED ORDER — FUROSEMIDE 20 MG PO TABS
20 MG | ORAL_TABLET | ORAL | 0 refills | Status: DC
Start: 2022-07-31 — End: 2022-08-07

## 2022-08-07 MED ORDER — FUROSEMIDE 20 MG PO TABS
20 MG | ORAL_TABLET | ORAL | 0 refills | Status: AC
Start: 2022-08-07 — End: 2022-08-16

## 2022-08-16 MED ORDER — FUROSEMIDE 20 MG PO TABS
20 | ORAL_TABLET | ORAL | 0 refills | Status: DC
Start: 2022-08-16 — End: 2022-09-07

## 2022-08-18 NOTE — Telephone Encounter (Signed)
Pt called to update the nurse on how the pt is feeling     Ph# [phone]

## 2022-08-21 NOTE — Progress Notes (Signed)
Pc off Lipitor x 3 wks and LE edema nearly gone. Toe numbness much better. Call in 10 days Jeffrey Soto/ update. If doing well, then consider Crestor 5 mg qd   Call back here sooner prn

## 2022-08-27 MED ORDER — LISINOPRIL 40 MG PO TABS
40 MG | ORAL_TABLET | Freq: Every day | ORAL | 3 refills | Status: AC
Start: 2022-08-27 — End: 2022-11-23

## 2022-09-01 MED ORDER — ROSUVASTATIN CALCIUM 5 MG PO TABS
5 MG | ORAL_TABLET | ORAL | 0 refills | Status: AC
Start: 2022-09-01 — End: 2022-09-15

## 2022-09-01 NOTE — Telephone Encounter (Signed)
Pc  toe edema nearly gone. Stop qod Lasix on 7/8. Start Crestor M and Th on 7/8  Call in 4 weeks Early Steel/ update or prn sooner

## 2022-09-01 NOTE — Telephone Encounter (Signed)
Pt is calling to follow up with the Dr. He is doing fine and if anything comes up he will call the office     Ph# [phone]

## 2022-09-07 MED ORDER — FUROSEMIDE 20 MG PO TABS
20 MG | ORAL_TABLET | ORAL | 0 refills | Status: AC
Start: 2022-09-07 — End: 2022-09-26

## 2022-09-15 MED ORDER — ROSUVASTATIN CALCIUM 5 MG PO TABS
5 MG | ORAL_TABLET | ORAL | 0 refills | Status: AC
Start: 2022-09-15 — End: 2022-10-13

## 2022-09-26 MED ORDER — FUROSEMIDE 20 MG PO TABS
20 | ORAL_TABLET | ORAL | 0 refills | Status: DC
Start: 2022-09-26 — End: 2022-10-13

## 2022-09-30 MED ORDER — DILTIAZEM HCL ER COATED BEADS 300 MG PO CP24
300 | ORAL_CAPSULE | Freq: Every day | ORAL | 3 refills | Status: AC
Start: 2022-09-30 — End: ?

## 2022-10-13 MED ORDER — FUROSEMIDE 20 MG PO TABS
20 | ORAL_TABLET | ORAL | 0 refills | Status: DC
Start: 2022-10-13 — End: 2023-12-21

## 2022-10-13 MED ORDER — ROSUVASTATIN CALCIUM 5 MG PO TABS
5 MG | ORAL_TABLET | ORAL | 0 refills | Status: DC
Start: 2022-10-13 — End: 2022-12-14

## 2022-11-05 MED ORDER — FLUOXETINE HCL 10 MG PO CAPS
10 | ORAL_CAPSULE | Freq: Every day | ORAL | 3 refills | Status: AC
Start: 2022-11-05 — End: ?

## 2022-11-23 ENCOUNTER — Inpatient Hospital Stay: Admit: 2022-11-23 | Payer: MEDICARE | Primary: Internal Medicine

## 2022-11-23 ENCOUNTER — Encounter: Attending: Internal Medicine | Primary: Internal Medicine

## 2022-11-23 DIAGNOSIS — R053 Chronic cough: Secondary | ICD-10-CM

## 2022-11-23 MED ORDER — VALSARTAN 320 MG PO TABS
320 MG | ORAL_TABLET | Freq: Every day | ORAL | 5 refills | Status: DC
Start: 2022-11-23 — End: 2023-03-17

## 2022-11-23 NOTE — Patient Instructions (Signed)
 Stop Lisinopril   Start Valsartan   Return in 3 weeks for office visit.   See Podiatrist about right foot pain

## 2022-11-23 NOTE — Progress Notes (Signed)
 Jeffrey Soto is a 77 y.o. wm CC:     SUBJECTIVE:    Occasional left foot swelling ; Jams Trickett/ plantar flexion  ,tends to  cramps   R sole 5th MTP intermittent pain , has callus ; pain better Traylon Schimming/ padding and Kailash Hinze/ home debridement callus.     Over 1 year cough occasional  clear sputum   Past Medical History:   Diagnosis Date    Arrhythmia     Hx PAT. dr. Chanda evaluated.     Bursitis of both shoulders     Depression     Elevated lipids 12/16/2009    Fatty liver     Gallbladder polyp     GERD (gastroesophageal reflux disease) 06/26/2003    EGD: duodenitis , dilation of Esophageal ring , DR. [name]    HTN (hypertension) 12/16/2009    Prostate cancer ([facility]) 06/02/2017    GLeason 3+ 3= 6 . 2019 . Dr. Bennet Mango     Current Outpatient Medications   Medication Sig Dispense Refill    METAMUCIL FIBER PO Take by mouth 2 tablespoonsful daily      valsartan  (DIOVAN ) 320 MG tablet Take 1 tablet by mouth daily In place of Lisinopril  30 tablet 5    FLUoxetine  (PROZAC ) 10 MG capsule TAKE 1 CAPSULE BY MOUTH DAILY 90 capsule 3    rosuvastatin  (CRESTOR ) 5 MG tablet TAKE 1 TABLET BY MOUTH ON MONDAYS AND THURSDAYS. USE IN PLACE OF ATORVASTATIN  8 tablet 0    furosemide  (LASIX ) 20 MG tablet TAKE 1 TABLET BY MOUTH EVERY OTHER DAY IN THE MORNING AS NEEDED FOR LEG SWELLING 10 tablet 0    dilTIAZem  (CARDIZEM  CD) 300 MG extended release capsule TAKE 1 CAPSULE BY MOUTH DAILY 90 capsule 3    omeprazole (PRILOSEC) 20 MG delayed release capsule Take 1 capsule by mouth daily      famotidine  (PEPCID ) 20 MG tablet TAKE ONE TABLET BY MOUTH TWICE A DAY 60 tablet 3    diphenhydrAMINE (BENADRYL) 50 MG tablet Take 1 tablet by mouth nightly      tamsulosin  (FLOMAX ) 0.4 MG capsule Take 1 capsule by mouth      dilTIAZem  (TIAZAC ) 300 MG extended release capsule Take 1 capsule by mouth daily       No current facility-administered medications for this visit.     No Known Allergies  Social History     Tobacco Use   Smoking Status Former   Smokeless Tobacco Never        Review of Systems  Review of Systems - negative except as per HPI      Physical Examination:  BP 138/88   Temp 97.3 F (36.3 C)   Ht 1.816 m (5' 11.5)   Wt 102.1 kg (225 lb)   BMI 30.94 kg/m   Physical Exam    General: Appears in no acute distress  Throat clear   Lung-clear   Heart - RRR Payton Prinsen/o mgr  Abdomen -bs+ soft, nt  Extremities -   Feet no edema   R sole MTP#5 callus nt   Feet neurovascular intact    CXR NAD to my viewing    Labs: No results found for this or any previous visit (from the past 8 hour(s)).    No results found for this visit on 11/23/22.     Assessment/Plan   Diagnosis Orders   1. Chronic cough  XR CHEST (2 VW)    Lipid Panel    CBC with Auto Differential  Comprehensive Metabolic Panel    CK    External Referral To Podiatry      2. Hypercholesterolemia  Lipid Panel    CBC with Auto Differential    Comprehensive Metabolic Panel    CK    External Referral To Podiatry      3. Encounter for long-term (current) use of medications  Lipid Panel    CBC with Auto Differential    Comprehensive Metabolic Panel    CK    External Referral To Podiatry      4. Right foot pain  External Referral To Podiatry      5. Callus of foot  External Referral To Podiatry      6. Essential hypertension        7. Malignant neoplasm of prostate (HCC)            Possible ACE cough. D/c Lisinopril . Start Valsartan    Lipids pending   3, 4 . Suspect painful R sole callus. Referred to Podiatry   5. HTN good control. See #1 above   6. PCA on active surveillance. Continue GU follow up   Follow up in 3 weeks or PRN.    Author:  Ryan Flake, MD 8:45 PM9/23/2024

## 2022-11-24 LAB — COMPREHENSIVE METABOLIC PANEL
ALT: 29 IU/L (ref 0–44)
AST: 26 IU/L (ref 0–40)
Albumin: 4.9 g/dL — ABNORMAL HIGH (ref 3.8–4.8)
Alkaline Phosphatase: 75 IU/L (ref 44–121)
BUN/Creatinine Ratio: 13 (ref 10–24)
BUN: 13 mg/dL (ref 8–27)
CO2: 24 mmol/L (ref 20–29)
Calcium: 9.6 mg/dL (ref 8.6–10.2)
Chloride: 102 mmol/L (ref 96–106)
Creatinine: 0.97 mg/dL (ref 0.76–1.27)
Est, Glom Filt Rate: 81 mL/min/{1.73_m2} (ref >59)
Globulin, Total: 2.4 g/dL (ref 1.5–4.5)
Glucose: 107 mg/dL — ABNORMAL HIGH (ref 70–99)
Potassium: 4.2 mmol/L (ref 3.5–5.2)
Sodium: 140 mmol/L (ref 134–144)
Total Bilirubin: 0.9 mg/dL (ref 0.0–1.2)
Total Protein: 7.3 g/dL (ref 6.0–8.5)

## 2022-11-24 LAB — CBC WITH AUTO DIFFERENTIAL
Basophils %: 1 %
Basophils Absolute: 0 10*3/uL (ref 0.0–0.2)
Eosinophils %: 2 %
Eosinophils Absolute: 0.1 10*3/uL (ref 0.0–0.4)
Hematocrit: 45.7 % (ref 37.5–51.0)
Hemoglobin: 15.1 g/dL (ref 13.0–17.7)
Immature Grans (Abs): 0 10*3/uL (ref 0.0–0.1)
Immature Granulocytes %: 0 %
Lymphocytes %: 26 %
Lymphocytes Absolute: 1.6 10*3/uL (ref 0.7–3.1)
MCH: 30.2 pg (ref 26.6–33.0)
MCHC: 33 g/dL (ref 31.5–35.7)
MCV: 91 fL (ref 79–97)
Monocytes %: 12 %
Monocytes Absolute: 0.8 10*3/uL (ref 0.1–0.9)
Neutrophils %: 59 %
Neutrophils Absolute: 3.7 10*3/uL (ref 1.4–7.0)
Platelets: 218 10*3/uL (ref 150–450)
RBC: 5 x10E6/uL (ref 4.14–5.80)
RDW: 13.2 % (ref 11.6–15.4)
WBC: 6.2 10*3/uL (ref 3.4–10.8)

## 2022-11-24 LAB — LIPID PANEL
Cholesterol, Total: 223 mg/dL — ABNORMAL HIGH (ref 100–199)
HDL: 37 mg/dL — ABNORMAL LOW (ref >39)
LDL Cholesterol: 138 mg/dL — ABNORMAL HIGH (ref 0–99)
Triglycerides: 266 mg/dL — ABNORMAL HIGH (ref 0–149)
VLDL Cholesterol Calculated: 48 mg/dL — ABNORMAL HIGH (ref 5–40)

## 2022-11-24 LAB — CK: Total CK: 131 U/L (ref 41–331)

## 2022-12-02 NOTE — Telephone Encounter (Signed)
 Left VM Cledis Sohn/ results. Use healthy diet. Same meds. Call back here prn

## 2022-12-14 ENCOUNTER — Ambulatory Visit: Admit: 2022-12-14 | Payer: MEDICARE | Attending: Internal Medicine | Primary: Internal Medicine

## 2022-12-14 DIAGNOSIS — I10 Essential (primary) hypertension: Secondary | ICD-10-CM

## 2022-12-14 MED ORDER — ROSUVASTATIN CALCIUM 5 MG PO TABS
5 MG | ORAL_TABLET | ORAL | 3 refills | Status: AC
Start: 2022-12-14 — End: 2023-02-22

## 2022-12-14 NOTE — Patient Instructions (Signed)
Rosuvastatin Mon, Wed , Fri   Return 12/23 for fasting cholesterol

## 2022-12-14 NOTE — Progress Notes (Signed)
Jeffrey Soto is a 77 y.o. wm CC: f/u     SUBJECTIVE:    Off Lisinopril . On Valsartan and Flonase . Cough better. Recent in am blood on nasal tissue when blowing nose. Had it in past   Left lower leg soon to have Moh's .     Denie indigestion     Tol Rosuv 2/ wk  which ended about 10 days ago      Purpura  left arm  >R arm     Past Medical History:   Diagnosis Date    Arrhythmia     Hx PAT. dr. [name] evaluated.     Bursitis of both shoulders     Depression     Elevated lipids 12/16/2009    Fatty liver     Gallbladder polyp     GERD (gastroesophageal reflux disease) 06/26/2003    EGD: duodenitis , dilation of Esophageal ring , DR. [name]    HTN (hypertension) 12/16/2009    Prostate cancer ([facility]) 06/02/2017    GLeason 3+ 3= 6 . 2019 . Dr. [name]   -    Current Outpatient Medications   Medication Sig Dispense Refill    omeprazole (PRILOSEC) 20 MG delayed release capsule Take 1 capsule by mouth daily      rosuvastatin (CRESTOR) 5 MG tablet Take 1 tablet by mouth three times a week 36 tablet 3    valsartan (DIOVAN) 320 MG tablet Take 1 tablet by mouth daily In place of Lisinopril 30 tablet 5    FLUoxetine (PROZAC) 10 MG capsule TAKE 1 CAPSULE BY MOUTH DAILY 90 capsule 3    dilTIAZem (CARDIZEM CD) 300 MG extended release capsule TAKE 1 CAPSULE BY MOUTH DAILY 90 capsule 3    famotidine (PEPCID) 20 MG tablet TAKE ONE TABLET BY MOUTH TWICE A DAY 60 tablet 3    dilTIAZem (TIAZAC) 300 MG extended release capsule Take 1 capsule by mouth daily      METAMUCIL FIBER PO Take by mouth 2 tablespoonsful daily      furosemide (LASIX) 20 MG tablet TAKE 1 TABLET BY MOUTH EVERY OTHER DAY IN THE MORNING AS NEEDED FOR LEG SWELLING 10 tablet 0    diphenhydrAMINE (BENADRYL) 50 MG tablet Take 1 tablet by mouth nightly      tamsulosin (FLOMAX) 0.4 MG capsule Take 1 capsule by mouth       No current facility-administered medications for this visit.     Allergies   Allergen Reactions    Lipitor [Atorvastatin]      Arthralgia      Social  History     Tobacco Use   Smoking Status Former   Smokeless Tobacco Never         Review of Systems  Review of Systems - negative except as per HPI      Physical Examination:  BP 138/80   Temp 97.7 F (36.5 C)   Ht 1.83 m (6' 0.05")   Wt 104.3 kg (230 lb)   BMI 31.15 kg/m   Physical Exam    General: Appears in no acute distress  Lung-clear   Heart - RRR   Abdomen -bs+ soft, nt  Extremities -   No pedal edema   L hand and arm purpura > right     Labs: No results found for this or any previous visit (from the past 8 hour(s)).    No results found for this visit on 12/14/22.     Assessment/Plan  Diagnosis Orders   1. Essential (primary) hypertension        2. Gastroesophageal reflux disease, unspecified whether esophagitis present        3. Hypercholesterolemia        4. Purpura ([facility])          HTN good control. Same rx   GERD. P: trial off Prilosec. Continue Pepcid and TUMS prn   Lipids. P: Crestor incr to 3 d /Tannya Gonet rpt lipids in late dec 2024  Purpura. Continue safety measures   F/u in  Physical 2025 or prn sooner     Follow up  PRN.    Author:  Nadara Mode, MD 10:15 PM10/14/2024

## 2023-01-05 NOTE — Telephone Encounter (Signed)
Pt would like to know if he can stop the Rosuvastatin 5 mg until he comes in to see the Dr    Ph# [phone]

## 2023-01-05 NOTE — Telephone Encounter (Signed)
Left VM - d/c Rosuvastatin and call back in 2 wks Kaysee Hergert/ update or prn sooner

## 2023-02-12 ENCOUNTER — Ambulatory Visit: Admit: 2023-02-12 | Payer: MEDICARE | Attending: Internal Medicine | Primary: Internal Medicine

## 2023-02-12 VITALS — BP 150/82 | Temp 98.10000°F | Ht 72.05 in | Wt 233.0 lb

## 2023-02-12 DIAGNOSIS — M5412 Radiculopathy, cervical region: Secondary | ICD-10-CM

## 2023-02-12 NOTE — Patient Instructions (Signed)
 Advil 2 with breakfast and 2 with dinner and call me on 12/17 pm with update

## 2023-02-12 NOTE — Progress Notes (Signed)
 Jeffrey Soto is a 77 y.o. rh Jeffrey Soto male  r neck and r arm pain     SUBJECTIVE:    1 wk right neck and right  arm  positional pain improved Jeffrey Soto/ certain r shoulder repostioning     Past Medical History:   Diagnosis Date    Arrhythmia     Hx PAT. dr. [name] ev

## 2023-02-16 NOTE — Telephone Encounter (Signed)
 Pc gave lab results. R arm neck feel better. Change Ibuprofen to prn . F/u next week for pre op visit or prn sooner

## 2023-02-18 LAB — COMPREHENSIVE METABOLIC PANEL
ALT: 40 [IU]/L (ref 0–44)
AST: 37 [IU]/L (ref 0–40)
Albumin: 4.8 g/dL (ref 3.8–4.8)
Alkaline Phosphatase: 80 [IU]/L (ref 44–121)
BUN/Creatinine Ratio: 19 (ref 10–24)
BUN: 18 mg/dL (ref 8–27)
CO2: 23 mmol/L (ref 20–29)
Calcium: 9.9 mg/dL (ref 8.6–10.2)
Chloride: 103 mmol/L (ref 96–106)
Creatinine: 0.95 mg/dL (ref 0.76–1.27)
Est, Glom Filt Rate: 83 mL/min/{1.73_m2} (ref >59)
Globulin, Total: 2.5 g/dL (ref 1.5–4.5)
Glucose: 123 mg/dL — ABNORMAL HIGH (ref 70–99)
Potassium: 4.4 mmol/L (ref 3.5–5.2)
Sodium: 141 mmol/L (ref 134–144)
Total Bilirubin: 0.4 mg/dL (ref 0.0–1.2)
Total Protein: 7.3 g/dL (ref 6.0–8.5)

## 2023-02-18 LAB — CK: Total CK: 172 U/L (ref 41–331)

## 2023-02-22 ENCOUNTER — Ambulatory Visit
Admit: 2023-02-22 | Payer: MEDICARE | Attending: Internal Medicine | Admitting: Internal Medicine | Primary: Internal Medicine

## 2023-02-22 ENCOUNTER — Encounter: Attending: Internal Medicine | Primary: Internal Medicine

## 2023-02-22 VITALS — BP 138/88 | HR 72 | Temp 97.10000°F | Ht 72.0 in | Wt 233.0 lb

## 2023-02-22 DIAGNOSIS — M7551 Bursitis of right shoulder: Secondary | ICD-10-CM

## 2023-02-22 MED ORDER — TRIAMCINOLONE ACETONIDE 40 MG/ML IJ SUSP
40 | Freq: Once | INTRAMUSCULAR | Status: AC
Start: 2023-02-22 — End: ?

## 2023-02-22 NOTE — Patient Instructions (Signed)
 Neosporin twice daily to left arm abrasion until it scabs up. Keep it open to air as much as possible   Stop Rosuvastatin   Call in 1 month with update on your knees   Return in summer 2025 for Physical

## 2023-02-22 NOTE — Progress Notes (Signed)
 Jeffrey Soto is a 77 y.o. wm CC: decreased vision     HPI   pre op for cataract  b/l ; first sgy 03/14/2022   Past Medical History:   Diagnosis Date    Arrhythmia     Hx PAT. dr. [name] evaluated.     Bursitis of both shoulders     Depression     Elevated

## 2023-03-17 MED ORDER — VALSARTAN 320 MG PO TABS
320 | ORAL_TABLET | Freq: Every day | ORAL | 3 refills | Status: DC
Start: 2023-03-17 — End: 2023-12-21

## 2023-04-09 MED ORDER — FAMOTIDINE 20 MG PO TABS
20 | ORAL_TABLET | Freq: Two times a day (BID) | ORAL | 3 refills | Status: AC
Start: 2023-04-09 — End: ?

## 2023-04-09 NOTE — Telephone Encounter (Signed)
Patient would like to speak with the nurse to provide some updated information     Ph# [phone]

## 2023-06-28 ENCOUNTER — Ambulatory Visit: Admit: 2023-06-28 | Payer: MEDICARE | Attending: Internal Medicine | Primary: Internal Medicine

## 2023-06-28 VITALS — BP 138/82 | Temp 97.20000°F | Ht 72.0 in | Wt 229.0 lb

## 2023-06-28 DIAGNOSIS — S335XXA Sprain of ligaments of lumbar spine, initial encounter: Secondary | ICD-10-CM

## 2023-06-28 NOTE — Patient Instructions (Signed)
 Return 6 months or sooner as needed

## 2023-06-28 NOTE — Progress Notes (Signed)
 Jeffrey Soto is a 78 y.o. wm CC: lower back pain     HPI     2 weeks  mid line non radiating lower back pain  occurs upon standing up and responds to  Salon Pas and Tylenol    Recent constipation Jeffrey Soto/o hematochezia   Past Medical History:   Diagnosis Date    Arrhythmia     Hx PAT. dr. Durene Soto evaluated.     Bursitis of both shoulders     Depression     Elevated lipids 12/16/2009    Fatty liver     Gallbladder polyp     GERD (gastroesophageal reflux disease) 06/26/2003    EGD: duodenitis , dilation of Esophageal ring , DR. [name]    HTN (hypertension) 12/16/2009    Prostate cancer (HCC) 06/02/2017    GLeason 3+ 3= 6 . 2019 . Dr. Marcelina Soto     Past Surgical History:   Procedure Laterality Date    CATARACT EXTRACTION Bilateral     COLONOSCOPY  06/26/03    diverticulosis, polypectomy, f/u dr. Orest Soto     COLONOSCOPY N/A 03/25/2022    COLONOSCOPY DIAGNOSTIC performed by Jeffrey Clarke, MD at [facility] ENDOSCOPY    GI      right inguinal hernia repair x3    OTHER SURGICAL HISTORY      removal pilonidal cyst    UROLOGICAL SURGERY  05/25/2017    Prostate biopsy by [name] MD    UROLOGICAL SURGERY      right orchiectomy for benign growth     Current Outpatient Medications   Medication Sig Dispense Refill    famotidine  (PEPCID ) 20 MG tablet TAKE 1 TABLET BY MOUTH TWICE A DAY 58 tablet 3    valsartan  (DIOVAN ) 320 MG tablet Take 1 tablet by mouth daily In place of Lisinopril  90 tablet 3    omeprazole (PRILOSEC) 20 MG delayed release capsule Take 1 capsule by mouth daily      METAMUCIL FIBER PO Take by mouth 2 tablespoonsful daily      FLUoxetine  (PROZAC ) 10 MG capsule TAKE 1 CAPSULE BY MOUTH DAILY 90 capsule 3    dilTIAZem  (CARDIZEM  CD) 300 MG extended release capsule TAKE 1 CAPSULE BY MOUTH DAILY 90 capsule 3    tamsulosin (FLOMAX) 0.4 MG capsule Take 1 capsule by mouth      furosemide  (LASIX ) 20 MG tablet TAKE 1 TABLET BY MOUTH EVERY OTHER DAY IN THE MORNING AS NEEDED FOR LEG SWELLING (Patient not taking: Reported on 06/28/2023)  10 tablet 0    dilTIAZem  (TIAZAC ) 300 MG extended release capsule Take 1 capsule by mouth daily       Current Facility-Administered Medications   Medication Dose Route Frequency Provider Last Rate Last Admin    triamcinolone  acetonide (KENALOG -40) injection 40 mg  40 mg Intra-artICUlar Once          Allergies   Allergen Reactions    Lipitor [Atorvastatin ]      Arthralgia     Rosuvastatin       Arthralgia      Family History   Problem Relation Age of Onset    Heart Disease Brother 53        sudden death  in sleep    Cancer Sister         lung    Cancer Sister         lung    Lung Disease Father         emphysema  Social History     Tobacco Use   Smoking Status Former   Smokeless Tobacco Never       Review of Systems  Denies  HA, dyspnea. Enlarging r face skin lesion  Cardiac:  Denies chest pain  GI: takes Metamucil .  Urination without difficulty  see gu  for PCA under active surveillance.     Physical Examination:  BP 138/82   Temp 97.2 F (36.2 C)   Ht 1.829 m (6')   Wt 103.9 kg (229 lb)   BMI 31.06 kg/m   Physical Exam  General:   Appears in no acute distress.   HEENT:   R paranasal small  pink raised skin lesion. O/Jeffrey Soto Negative                   Lungs:   Clear        Heart:  Regular without murmur, gallop or rub       Abdomen:   Benign exam without organomegaly or mass palpable  flexion waist + lower back pain                  Rectal:  Per gu    Extremities: No edema      Neurologic: Grossly nonfocal  .    No results found for this or any previous visit (from the past 8 hours).            No results found for this visit on 06/28/23.  Assessment/Plan   Diagnosis Orders   1. Lumbar sprain, initial encounter  CBC with Auto Differential    Lipid Panel    Comprehensive Metabolic Panel      2. Hypercholesterolemia  CBC with Auto Differential    Lipid Panel    Comprehensive Metabolic Panel      3. Essential hypertension  CBC with Auto Differential    Lipid Panel    Comprehensive Metabolic Panel      4. Encounter  for long-term (current) use of medications  CBC with Auto Differential    Lipid Panel    Comprehensive Metabolic Panel      5. Skin lesion  CBC with Auto Differential    Lipid Panel    Comprehensive Metabolic Panel      6. Malignant neoplasm of prostate (HCC)          I advised that he skip playing golf this week.  Continue Salonpas and Tylenol as needed.  Gradually walk more  HTN under good control same Rx  Await lipids  See dermatology for enlarging face skin lesion  Follow-up with urology for prostate CA underactive surveillance  RV 6 months or sooner as needed    Author:  Gregor Learned, MD 9:18 PM4/28/2025

## 2023-06-29 LAB — COMPREHENSIVE METABOLIC PANEL
ALT: 46 IU/L — ABNORMAL HIGH (ref 0–44)
AST: 33 IU/L (ref 0–40)
Albumin: 4.7 g/dL (ref 3.8–4.8)
Alkaline Phosphatase: 82 IU/L (ref 44–121)
BUN/Creatinine Ratio: 19 (ref 10–24)
BUN: 18 mg/dL (ref 8–27)
CO2: 22 mmol/L (ref 20–29)
Calcium: 9.4 mg/dL (ref 8.6–10.2)
Chloride: 100 mmol/L (ref 96–106)
Creatinine: 0.95 mg/dL (ref 0.76–1.27)
Est, Glom Filt Rate: 82 mL/min/{1.73_m2} (ref >59)
Globulin, Total: 2.4 g/dL (ref 1.5–4.5)
Glucose: 97 mg/dL (ref 70–99)
Potassium: 4.2 mmol/L (ref 3.5–5.2)
Sodium: 138 mmol/L (ref 134–144)
Total Bilirubin: 0.4 mg/dL (ref 0.0–1.2)
Total Protein: 7.1 g/dL (ref 6.0–8.5)

## 2023-06-29 LAB — LIPID PANEL
Cholesterol, Total: 247 mg/dL — ABNORMAL HIGH (ref 100–199)
HDL: 35 mg/dL — ABNORMAL LOW (ref >39)
LDL Cholesterol: 158 mg/dL — ABNORMAL HIGH (ref 0–99)
Triglycerides: 289 mg/dL — ABNORMAL HIGH (ref 0–149)
VLDL Cholesterol Calculated: 54 mg/dL — ABNORMAL HIGH (ref 5–40)

## 2023-06-29 LAB — CBC WITH AUTO DIFFERENTIAL
Basophils %: 1 %
Basophils Absolute: 0.1 10*3/uL (ref 0.0–0.2)
Eosinophils %: 3 %
Eosinophils Absolute: 0.2 10*3/uL (ref 0.0–0.4)
Hematocrit: 46.1 % (ref 37.5–51.0)
Hemoglobin: 15.1 g/dL (ref 13.0–17.7)
Immature Grans (Abs): 0 10*3/uL (ref 0.0–0.1)
Immature Granulocytes %: 0 %
Lymphocytes %: 26 %
Lymphocytes Absolute: 1.8 10*3/uL (ref 0.7–3.1)
MCH: 29.1 pg (ref 26.6–33.0)
MCHC: 32.8 g/dL (ref 31.5–35.7)
MCV: 89 fL (ref 79–97)
Monocytes %: 10 %
Monocytes Absolute: 0.7 10*3/uL (ref 0.1–0.9)
Neutrophils %: 60 %
Neutrophils Absolute: 4.1 10*3/uL (ref 1.4–7.0)
Platelets: 239 10*3/uL (ref 150–450)
RBC: 5.19 x10E6/uL (ref 4.14–5.80)
RDW: 13 % (ref 11.6–15.4)
WBC: 6.8 10*3/uL (ref 3.4–10.8)

## 2023-06-30 NOTE — Telephone Encounter (Signed)
 I left VM Jeffrey Soto/ recent lab results. Use healthy diet and exercise as tolerated to help lower lipids. Call back here when back feels better to discuss possible use of Pravastatin  10 mg  for lipid d/o. F/u sooner prn

## 2023-06-30 NOTE — Telephone Encounter (Signed)
 Called pt. Lower back feels about same as 2 d ago . Continue Salon pas, Tylenol prn ; add short walks . No golf for now. Call in 6 d Jeffrey Soto/ update or prn sooner

## 2023-07-06 MED ORDER — PRAVASTATIN SODIUM 10 MG PO TABS
10 | ORAL_TABLET | ORAL | 11 refills | 90.00000 days | Status: DC
Start: 2023-07-06 — End: 2023-12-21

## 2023-07-06 NOTE — Progress Notes (Signed)
 Ph call - lower back feels improved. Gradually increase physical activity . Add Pravastatin 10 mg titrating to 2 pills per week . Call in 4 weeks or prn sooner

## 2023-07-06 NOTE — Telephone Encounter (Signed)
 Pt said that he was told to call and follow up with the Dr about his past appointment     Ph# [phone]

## 2023-07-31 MED ORDER — FAMOTIDINE 20 MG PO TABS
20 | ORAL_TABLET | Freq: Two times a day (BID) | ORAL | 11 refills | 30.00000 days | Status: DC
Start: 2023-07-31 — End: 2024-02-11

## 2023-09-28 ENCOUNTER — Ambulatory Visit: Admit: 2023-09-28 | Payer: MEDICARE | Attending: Internal Medicine | Primary: Internal Medicine

## 2023-09-28 VITALS — BP 150/94 | Temp 97.30000°F | Ht 72.0 in | Wt 233.0 lb

## 2023-09-28 DIAGNOSIS — M7551 Bursitis of right shoulder: Principal | ICD-10-CM

## 2023-09-28 NOTE — Progress Notes (Signed)
 Jeffrey Soto is a 78 y.o. wm CC: shoulder pain     SUBJECTIVE:    Chronic  intermittent R > L shoulder worse  in the past 6 weeks helped briefly by  Diclofenac. Pt requesting steroid inj each shoulder     Past Medical History:   Diagnosis Date    Arrhythmia     Hx PAT. dr. Chanda evaluated.     Bursitis of both shoulders     Depression     Elevated lipids 12/16/2009    Fatty liver     Gallbladder polyp     GERD (gastroesophageal reflux disease) 06/26/2003    EGD: duodenitis , dilation of Esophageal ring , DR. [name]    HTN (hypertension) 12/16/2009    Prostate cancer (HCC) 06/02/2017    GLeason 3+ 3= 6 . 2019 . Dr. Bennet Soto     Current Outpatient Medications   Medication Sig Dispense Refill    DICLOFENAC PO Take 75 mg by mouth 2 times daily as needed (joint or back pain)      famotidine  (PEPCID ) 20 MG tablet TAKE 1 TABLET BY MOUTH 2 TIMES A DAY 30 tablet 11    pravastatin  (PRAVACHOL ) 10 MG tablet One pill each Monday for the first 2 weeks. After that , one pill twice weekly on Monday and Thursday 8 tablet 11    valsartan  (DIOVAN ) 320 MG tablet Take 1 tablet by mouth daily In place of Lisinopril  90 tablet 3    omeprazole (PRILOSEC) 20 MG delayed release capsule Take 1 capsule by mouth daily      METAMUCIL FIBER PO Take by mouth 2 tablespoonsful daily      FLUoxetine  (PROZAC ) 10 MG capsule TAKE 1 CAPSULE BY MOUTH DAILY 90 capsule 3    furosemide  (LASIX ) 20 MG tablet TAKE 1 TABLET BY MOUTH EVERY OTHER DAY IN THE MORNING AS NEEDED FOR LEG SWELLING (Patient not taking: Reported on 06/28/2023) 10 tablet 0    dilTIAZem  (CARDIZEM  CD) 300 MG extended release capsule TAKE 1 CAPSULE BY MOUTH DAILY 90 capsule 3    tamsulosin (FLOMAX) 0.4 MG capsule Take 1 capsule by mouth      dilTIAZem  (TIAZAC ) 300 MG extended release capsule Take 1 capsule by mouth daily       Current Facility-Administered Medications   Medication Dose Route Frequency Provider Last Rate Last Admin    triamcinolone  acetonide (KENALOG -40) injection 40 mg  40  mg Intra-artICUlar Once         triamcinolone  acetonide (KENALOG -40) injection 40 mg  40 mg Intra-artICUlar Once         triamcinolone  acetonide (KENALOG -40) injection 40 mg  40 mg Intra-artICUlar Once          Allergies   Allergen Reactions    Lipitor [Atorvastatin ]      Arthralgia     Rosuvastatin       Arthralgia      Social History     Tobacco Use   Smoking Status Former   Smokeless Tobacco Never         Review of Systems  Review of Systems - negative except as per HPI      Physical Examination:  BP (!) 150/94   Temp 97.3 F (36.3 C)   Ht 1.829 m (6')   Wt 105.7 kg (233 lb)   BMI 31.60 kg/m   Physical Exam    General: Appears in no acute distress  Lung-  Heart -   Abdomen -  Extremities -  R> L mild decr ROM   Deltoid strength intact b/l     Labs: No results found for this or any previous visit (from the past 8 hours).    No results found for this visit on 09/28/23.     Assessment/Plan   Diagnosis Orders   1. Bursitis of right shoulder  triamcinolone  acetonide (KENALOG -40) injection 40 mg    DRAIN/INJECT LARGE JOINT/BURSA (20610)    PR OFFICE OUTPATIENT VISIT 15 MINUTES [00786]      2. Bursitis of left shoulder  triamcinolone  acetonide (KENALOG -40) injection 40 mg    DRAIN/INJECT LARGE JOINT/BURSA (20610)    PR OFFICE OUTPATIENT VISIT 15 MINUTES [00786]          After discussion, each shoulder injected separately  under sterile conditions Jeffrey Soto/ Kenalog  40 mg and Lidocaine  1%. There were no immediate complications apparent   Follow up PRN.    Author:  Ryan Flake, MD 10:12 PM7/29/2025

## 2023-09-29 MED ORDER — TRIAMCINOLONE ACETONIDE 40 MG/ML IJ SUSP
40 | Freq: Once | INTRAMUSCULAR | Status: AC
Start: 2023-09-29 — End: ?

## 2023-10-15 MED ORDER — DILTIAZEM HCL ER COATED BEADS 300 MG PO CP24
300 | ORAL_CAPSULE | Freq: Every day | ORAL | 3 refills | 65.00000 days | Status: DC
Start: 2023-10-15 — End: 2023-12-10

## 2023-10-31 MED ORDER — FLUOXETINE HCL 10 MG PO CAPS
10 | ORAL_CAPSULE | Freq: Every day | ORAL | 3 refills | Status: AC
Start: 2023-10-31 — End: ?

## 2023-12-08 ENCOUNTER — Emergency Department: Admit: 2023-12-08 | Payer: MEDICARE | Primary: Internal Medicine

## 2023-12-08 ENCOUNTER — Inpatient Hospital Stay
Admission: EM | Admit: 2023-12-08 | Discharge: 2023-12-10 | Disposition: A | Discharge status: Home / Self Care | Payer: MEDICARE | Admitting: Hospitalist

## 2023-12-08 DIAGNOSIS — R079 Chest pain, unspecified: Principal | ICD-10-CM

## 2023-12-08 DIAGNOSIS — I214 Non-ST elevation (NSTEMI) myocardial infarction: Secondary | ICD-10-CM

## 2023-12-08 LAB — CBC WITH AUTO DIFFERENTIAL
Basophils %: 0.6 % (ref 0.0–1.0)
Basophils Absolute: 0.05 K/UL (ref 0.00–0.10)
Eosinophils %: 1 % (ref 0.0–7.0)
Eosinophils Absolute: 0.09 K/UL (ref 0.00–0.40)
Hematocrit: 41.7 % (ref 36.6–50.3)
Hemoglobin: 14.2 g/dL (ref 12.1–17.0)
Immature Granulocytes %: 0.3 % (ref 0.0–0.5)
Immature Granulocytes Absolute: 0.03 K/UL (ref 0.00–0.04)
Lymphocytes %: 14.2 % (ref 12.0–49.0)
Lymphocytes Absolute: 1.28 K/UL (ref 0.80–3.50)
MCH: 29.5 pg (ref 26.0–34.0)
MCHC: 34.1 g/dL (ref 30.0–36.5)
MCV: 86.7 FL (ref 80.0–99.0)
MPV: 10.1 FL (ref 8.9–12.9)
Monocytes %: 11.1 % (ref 5.0–13.0)
Monocytes Absolute: 1 K/UL (ref 0.00–1.00)
Neutrophils %: 72.8 % (ref 32.0–75.0)
Neutrophils Absolute: 6.57 K/UL (ref 1.80–8.00)
Nucleated RBCs: 0 /100{WBCs}
Platelets: 193 K/uL (ref 150–400)
RBC: 4.81 M/uL (ref 4.10–5.70)
RDW: 13.6 % (ref 11.5–14.5)
WBC: 9 K/uL (ref 4.1–11.1)
nRBC: 0 K/uL (ref 0.00–0.01)

## 2023-12-08 LAB — LIPASE: Lipase: 45 U/L (ref 13–75)

## 2023-12-08 LAB — COMPREHENSIVE METABOLIC PANEL
ALT: 47 U/L (ref 12–78)
AST: 28 U/L (ref 15–37)
Albumin/Globulin Ratio: 1.4 (ref 1.1–2.2)
Albumin: 4.1 g/dL (ref 3.5–5.0)
Alk Phosphatase: 57 U/L (ref 45–117)
Anion Gap: 11 mmol/L (ref 2–12)
BUN/Creatinine Ratio: 18 (ref 12–20)
BUN: 20 mg/dL (ref 6–20)
CO2: 27 mmol/L (ref 21–32)
Calcium: 8.9 mg/dL (ref 8.5–10.1)
Chloride: 102 mmol/L (ref 97–108)
Creatinine: 1.09 mg/dL (ref 0.70–1.30)
Est, Glom Filt Rate: 70 ml/min/1.73m2 (ref >60)
Globulin: 3 g/dL (ref 2.0–4.0)
Glucose: 103 mg/dL — ABNORMAL HIGH (ref 65–100)
Potassium: 3.7 mmol/L (ref 3.5–5.1)
Sodium: 140 mmol/L (ref 136–145)
Total Bilirubin: 0.7 mg/dL (ref 0.2–1.0)
Total Protein: 7.1 g/dL (ref 6.4–8.2)

## 2023-12-08 LAB — TROPONIN: Troponin, High Sensitivity: 89 ng/L — ABNORMAL HIGH (ref 0–76)

## 2023-12-08 LAB — EXTRA TUBES HOLD

## 2023-12-08 LAB — MAGNESIUM: Magnesium: 2 mg/dL (ref 1.6–2.4)

## 2023-12-08 MED ORDER — ASPIRIN 81 MG PO CHEW
81 | ORAL | Status: AC
Start: 2023-12-08 — End: 2023-12-08
  Administered 2023-12-08: 23:00:00 324 mg via ORAL

## 2023-12-08 MED FILL — CHILDRENS ASPIRIN 81 MG PO CHEW: 81 mg | ORAL | Qty: 4

## 2023-12-08 NOTE — ED Triage Notes (Addendum)
"  Pt states he began having chest pain around 3pm. States he had been playing golf earlier today and felt fine but then all of a sudden began having substernal chest pain.   Denies cardiac hx.   [facility] and was told everything looked okay but told to come to ED for further workup as they were not able to run a troponin. Denies taking meds for pain prior to arrival.   "

## 2023-12-08 NOTE — ED Provider Notes (Signed)
 "      [facility]  EMERGENCY DEPARTMENT ENCOUNTER      Pt Name: Jeffrey Soto  MRN: [id number]  Birthdate 1945-03-10  Date of evaluation: 12/08/2023  Provider: Reyes Rocks, MD    CHIEF COMPLAINT       Chief Complaint   Patient presents with    Chest Pain         HISTORY OF PRESENT ILLNESS   (Location/Symptom, Timing/Onset, Context/Setting, Quality, Duration, Modifying Factors, Severity)  Note limiting factors.   78 year old male presents from home with complaints of chest pain.  States he played a round of golf earlier today without any difficulties.  Got home around 1:00.  Was getting ready to take a shower when he started to have some substernal chest pain this was around 3 PM.  States he took a Tylenol  and got showered.  Went to urgent care where he was told the EKG looked okay but was advised to come here for further testing.  He states pain is largely resolved at this point.  Denies any history of cardiac disease.  States he has been coughing recently.  Review of EMR shows a history of GERD, fatty liver, prostate cancer, arrhythmia, hypertension.    The history is provided by the patient and medical records.         Review of External Medical Records:     Nursing Notes were reviewed.    REVIEW OF SYSTEMS    (2-9 systems for level 4, 10 or more for level 5)     Review of Systems   Constitutional:  Negative for fatigue.   HENT:  Negative for sore throat.    Eyes:  Negative for visual disturbance.   Respiratory:  Negative for shortness of breath.    Cardiovascular:  Negative for palpitations.   Gastrointestinal:  Negative for constipation.   Genitourinary:  Negative for difficulty urinating.   Musculoskeletal:  Negative for myalgias.   Skin:  Negative for rash.       Except as noted above the remainder of the review of systems was reviewed and negative.       PAST MEDICAL HISTORY     Past Medical History:   Diagnosis Date    Arrhythmia     Hx PAT. dr. Chanda evaluated.     Bursitis of both  shoulders     Depression     Elevated lipids 12/16/2009    Fatty liver     Gallbladder polyp     GERD (gastroesophageal reflux disease) 06/26/2003    EGD: duodenitis , dilation of Esophageal ring , DR. [name]    HTN (hypertension) 12/16/2009    Prostate cancer (HCC) 06/02/2017    GLeason 3+ 3= 6 . 2019 . Dr. Bennet Mango         SURGICAL HISTORY       Past Surgical History:   Procedure Laterality Date    CATARACT EXTRACTION Bilateral     COLONOSCOPY  06/26/03    diverticulosis, polypectomy, f/u dr. Alray     COLONOSCOPY N/A 03/25/2022    COLONOSCOPY DIAGNOSTIC performed by Danial Bias, MD at [facility] ENDOSCOPY    GI      right inguinal hernia repair x3    OTHER SURGICAL HISTORY      removal pilonidal cyst    UROLOGICAL SURGERY  05/25/2017    Prostate biopsy by [name] MD    UROLOGICAL SURGERY      right orchiectomy for benign growth  CURRENT MEDICATIONS       Previous Medications    DICLOFENAC PO    Take 75 mg by mouth as needed in the morning and 75 mg as needed in the evening (joint or back pain).    DILTIAZEM  (CARDIZEM  CD) 300 MG EXTENDED RELEASE CAPSULE    TAKE 1 CAPSULE BY MOUTH DAILY    DILTIAZEM  (TIAZAC ) 300 MG EXTENDED RELEASE CAPSULE    Take 1 capsule by mouth daily    FAMOTIDINE  (PEPCID ) 20 MG TABLET    TAKE 1 TABLET BY MOUTH 2 TIMES A DAY    FLUOXETINE  (PROZAC ) 10 MG CAPSULE    TAKE 1 CAPSULE BY MOUTH DAILY    FUROSEMIDE  (LASIX ) 20 MG TABLET    TAKE 1 TABLET BY MOUTH EVERY OTHER DAY IN THE MORNING AS NEEDED FOR LEG SWELLING    METAMUCIL FIBER PO    Take by mouth 2 tablespoonsful daily    OMEPRAZOLE (PRILOSEC) 20 MG DELAYED RELEASE CAPSULE    Take 1 capsule by mouth daily    PRAVASTATIN  (PRAVACHOL ) 10 MG TABLET    One pill each Monday for the first 2 weeks. After that , one pill twice weekly on Monday and Thursday    TAMSULOSIN  (FLOMAX ) 0.4 MG CAPSULE    Take 1 capsule by mouth    VALSARTAN  (DIOVAN ) 320 MG TABLET    Take 1 tablet by mouth daily In place of Lisinopril        ALLERGIES     Lipitor  [atorvastatin ] and Rosuvastatin     FAMILY HISTORY       Family History   Problem Relation Age of Onset    Heart Disease Brother 80        sudden death  in sleep    Cancer Sister         lung    Cancer Sister         lung    Lung Disease Father         emphysema          SOCIAL HISTORY       Social History     Socioeconomic History    Marital status: Married   Tobacco Use    Smoking status: Former    Smokeless tobacco: Never   Substance and Sexual Activity    Alcohol use: Yes     Social Drivers of Health     Physical Activity: Sufficiently Active (11/23/2022)    Exercise Vital Sign     Days of Exercise per Week: 7 days     Minutes of Exercise per Session: 40 min           PHYSICAL EXAM    (up to 7 for level 4, 8 or more for level 5)     ED Triage Vitals [12/08/23 1728]   BP Girls Systolic BP Percentile Girls Diastolic BP Percentile Boys Systolic BP Percentile Boys Diastolic BP Percentile Temp Temp Source Pulse   (!) 197/97 -- -- -- -- 98.4 F (36.9 C) Oral 64      Respirations SpO2 Height Weight - Scale       18 97 % 1.88 m (6' 2) 104.4 kg (230 lb 2.6 oz)           Body mass index is 29.55 kg/m.    Physical Exam  Constitutional:       Appearance: He is not ill-appearing.   HENT:      Head: Normocephalic.      Mouth/Throat:  Mouth: Mucous membranes are moist.   Eyes:      General: No scleral icterus.  Cardiovascular:      Rate and Rhythm: Normal rate.   Pulmonary:      Effort: Pulmonary effort is normal.   Abdominal:      General: There is no distension.   Musculoskeletal:         General: No deformity.      Cervical back: Neck supple.   Skin:     General: Skin is warm.   Neurological:      General: No focal deficit present.      Mental Status: He is alert and oriented to person, place, and time.         DIAGNOSTIC RESULTS     EKG: All EKG's are interpreted by the Emergency Department Physician who either signs or Co-signs this chart in the absence of a cardiologist.        RADIOLOGY:   Non-plain film images  such as CT, Ultrasound and MRI are read by the radiologist. Plain radiographic images are visualized and preliminarily interpreted by the emergency physician with the below findings:        Interpretation per the Radiologist below, if available at the time of this note:    XR CHEST (2 VW)   Final Result      Normal PA and lateral chest views.         Electronically signed by Kathalene Christ           LABS:  Labs Reviewed   COMPREHENSIVE METABOLIC PANEL - Abnormal; Notable for the following components:       Result Value    Glucose 103 (*)     All other components within normal limits   TROPONIN - Abnormal; Notable for the following components:    Troponin, High Sensitivity 89 (*)     All other components within normal limits   TROPONIN - Abnormal; Notable for the following components:    Troponin, High Sensitivity 706 (*)     All other components within normal limits   CBC WITH AUTO DIFFERENTIAL   LIPASE   MAGNESIUM    EXTRA TUBES HOLD       All other labs were within normal range or not returned as of this dictation.    EMERGENCY DEPARTMENT COURSE and DIFFERENTIAL DIAGNOSIS/MDM:   Vitals:    Vitals:    12/08/23 1728 12/08/23 1944   BP: (!) 197/97 (!) 169/93   Pulse: 64 66   Resp: 18    Temp: 98.4 F (36.9 C)    TempSrc: Oral    SpO2: 97% 96%   Weight: 104.4 kg (230 lb 2.6 oz)    Height: 1.88 m (6' 2)            Medical Decision Making  Amount and/or Complexity of Data Reviewed  Labs: ordered.  Radiology: ordered.  ECG/medicine tests: ordered. Decision-making details documented in ED Course.    Risk  OTC drugs.  Prescription drug management.  Decision regarding hospitalization.            REASSESSMENT     ED Course as of 12/08/23 2024   Wed Dec 08, 2023   1729 EKG 12 Lead  ED ECG interpretation:  Rhythm: normal sinus rhythm. Rate (approx.): 64.  Axis: normal.  ST segment:  No concerning ST elevations or depressions. LAFB. This EKG was independently interpreted by Reyes ONEIDA Rocks, MD,ED Provider.   [JM]  ED  Course User Index  [JM] Jonette Reyes DASEN, MD           CONSULTS:  IP CONSULT TO CARDIOLOGY    PROCEDURES:  Unless otherwise noted below, none     Procedures      FINAL IMPRESSION      1. Chest pain, unspecified type    2. Elevated troponin          DISPOSITION/PLAN   DISPOSITION Decision To Admit 12/08/2023 08:20:57 PM    Perfect Serve Consult for Admission  8:24 PM    ED Room Number: SER06/06  Patient Name and age:  Jeffrey Soto 78 y.o.  male  Working Diagnosis:   1. Chest pain, unspecified type    2. Elevated troponin        COVID-19 Suspicion: No  Sepsis present:  No  Reassessment needed: No  Code Status:  Full Code  Readmission: No  Isolation Requirements: no  Recommended Level of Care: telemetry  Department: [facility] Adult ED - [phone]  Consulting Provider:     Other:  chest pain this afternoon at 3pm after playing golf.  No resolved.  Trop 86 -> 706.  Pain free at this time.  No h/o heart disease.  Pt given asa/lovenox .  Cardiology being called.    Total critical care time spent exclusive of procedures:  35 minutes.     PATIENT REFERRED TO:  No follow-up provider specified.    DISCHARGE MEDICATIONS:  New Prescriptions    No medications on file         (Please note that portions of this note were completed with a voice recognition program.  Efforts were made to edit the dictations but occasionally words are mis-transcribed.)    Reyes Jonette, MD (electronically signed)  Emergency Attending Physician / Physician Assistant / Nurse Practitioner             Jonette Reyes DASEN, MD  12/08/23 2025    "

## 2023-12-08 NOTE — ED Notes (Signed)
"  Inpatient bed assigned, [facility] requested for transport, ETA 2233    "

## 2023-12-08 NOTE — ED Notes (Signed)
"  TRANSFER - OUT REPORT:    Verbal report given to Chiquita, RN on Jeffrey Soto  being transferred to [facility] for routine progression of patient care       Report consisted of patient's Situation, Background, Assessment and   Recommendations(SBAR).     Information from the following report(s) Nurse Handoff Report was reviewed with the receiving nurse.    Kinder Fall Assessment:    Presents to emergency department  because of falls (Syncope, seizure, or loss of consciousness): No  Age > 70: Yes  Altered Mental Status, Intoxication with alcohol or substance confusion (Disorientation, impaired judgment, poor safety awaremess, or inability to follow instructions): No  Impaired Mobility: Ambulates or transfers with assistive devices or assistance; Unable to ambulate or transer.: No  Nursing Judgement: No          Lines:   Peripheral IV 12/08/23 Left Antecubital (Active)   Site Assessment Clean, dry & intact 12/08/23 1744   Line Status Brisk blood return;Normal saline locked 12/08/23 1744   Phlebitis Assessment No symptoms 12/08/23 1744   Infiltration Assessment 0 12/08/23 1744   Dressing Status New dressing applied 12/08/23 1744   Dressing Type Transparent 12/08/23 1744   Dressing Intervention New 12/08/23 1744        Opportunity for questions and clarification was provided.      Patient transported with:  Monitor        "

## 2023-12-09 ENCOUNTER — Encounter

## 2023-12-09 ENCOUNTER — Observation Stay: Payer: MEDICARE | Primary: Internal Medicine

## 2023-12-09 LAB — EKG 12-LEAD
Atrial Rate: 64 {beats}/min
Atrial Rate: 69 {beats}/min
Diagnosis: NORMAL
P Axis: 62 degrees
P Axis: 59 degrees
P-R Interval: 222 ms
P-R Interval: 170 ms
Q-T Interval: 388 ms
Q-T Interval: 428 ms
QRS Duration: 102 ms
QRS Duration: 114 ms
QTc Calculation (Bazett): 400 ms
QTc Calculation (Bazett): 458 ms
R Axis: -53 degrees
R Axis: -64 degrees
T Axis: 30 degrees
T Axis: 30 degrees
Ventricular Rate: 64 {beats}/min
Ventricular Rate: 69 {beats}/min

## 2023-12-09 LAB — CARDIAC PROCEDURE
Body Surface Area: 2.33 m2
Body Surface Area: 2.33 m2

## 2023-12-09 LAB — HEMOGLOBIN A1C
Estimated Avg Glucose: 119 mg/dL
Hemoglobin A1C: 5.8 % — ABNORMAL HIGH (ref 4.0–5.6)

## 2023-12-09 LAB — POCT ACTIVATED CLOTTING TIME: Activated Clotting Time: 394 s — ABNORMAL HIGH (ref 79–138)

## 2023-12-09 LAB — TSH: TSH, 3rd Generation: 3.31 u[IU]/mL (ref 0.270–4.200)

## 2023-12-09 LAB — LIPID PANEL
Chol/HDL Ratio: 6.2 — ABNORMAL HIGH (ref 0.0–5.0)
Cholesterol, Total: 215 mg/dL — ABNORMAL HIGH (ref 0–200)
HDL: 35 mg/dL — ABNORMAL LOW (ref 40–60)
LDL Cholesterol: 129 mg/dL — ABNORMAL HIGH (ref 0–100)
Triglycerides: 256 mg/dL — ABNORMAL HIGH (ref 0–150)
VLDL Cholesterol Calculated: 51 mg/dL

## 2023-12-09 LAB — TROPONIN
Troponin T: 264 ng/L (ref 0–22)
Troponin T: 267 ng/L (ref 0–22)
Troponin, High Sensitivity: 706 ng/L (ref 0–76)

## 2023-12-09 MED ORDER — HEPARIN SOD (PORCINE) IN D5W 100 UNIT/ML IV SOLN
100 | INTRAVENOUS | Status: AC
Start: 2023-12-09 — End: 2023-12-09

## 2023-12-09 MED ORDER — ACETAMINOPHEN 650 MG RE SUPP
650 | Freq: Four times a day (QID) | RECTAL | Status: DC | PRN
Start: 2023-12-09 — End: 2023-12-10

## 2023-12-09 MED ORDER — ACETAMINOPHEN 325 MG PO TABS
325 | ORAL | Status: DC | PRN
Start: 2023-12-09 — End: 2023-12-09

## 2023-12-09 MED ORDER — HEPARIN SOD (PORCINE) IN D5W 100 UNIT/ML IV SOLN
100 | INTRAVENOUS | Status: DC
Start: 2023-12-09 — End: 2023-12-09
  Administered 2023-12-09: 13:00:00 9 [IU]/kg/h via INTRAVENOUS

## 2023-12-09 MED ORDER — FENTANYL CITRATE PF 50 MCG/ML IJ SOSY
50 | INTRAMUSCULAR | Status: DC | PRN
Start: 2023-12-09 — End: 2023-12-09
  Administered 2023-12-09: 11:00:00 25 via INTRAVENOUS

## 2023-12-09 MED ORDER — MIDAZOLAM HCL 2 MG/2ML IJ SOLN
2 | INTRAMUSCULAR | Status: DC | PRN
Start: 2023-12-09 — End: 2023-12-09
  Administered 2023-12-09 (×4): 1 via INTRAVENOUS

## 2023-12-09 MED ORDER — FLUOXETINE HCL 10 MG PO CAPS
10 | Freq: Every day | ORAL | Status: DC
Start: 2023-12-09 — End: 2023-12-10
  Administered 2023-12-09 – 2023-12-10 (×2): 10 mg via ORAL

## 2023-12-09 MED ORDER — LIDOCAINE HCL 1 % IJ SOLN
1 | INTRAMUSCULAR | Status: DC | PRN
Start: 2023-12-09 — End: 2023-12-09
  Administered 2023-12-09: 11:00:00 5 via INTRADERMAL

## 2023-12-09 MED ORDER — ACETAMINOPHEN 325 MG PO TABS
325 | Freq: Four times a day (QID) | ORAL | Status: DC | PRN
Start: 2023-12-09 — End: 2023-12-09
  Administered 2023-12-09: 10:00:00 650 mg via ORAL

## 2023-12-09 MED ORDER — FENTANYL CITRATE (PF) 100 MCG/2ML IJ SOLN
100 | INTRAMUSCULAR | Status: AC
Start: 2023-12-09 — End: 2023-12-09

## 2023-12-09 MED ORDER — HEPARIN SODIUM (PORCINE) 1000 UNIT/ML IJ SOLN
1000 | INTRAMUSCULAR | Status: AC
Start: 2023-12-09 — End: 2023-12-09

## 2023-12-09 MED ORDER — MIDAZOLAM HCL 2 MG/2ML IJ SOLN
2 | INTRAMUSCULAR | Status: AC
Start: 2023-12-09 — End: 2023-12-09

## 2023-12-09 MED ORDER — TICAGRELOR 90 MG PO TABS
90 | ORAL | Status: AC
Start: 2023-12-09 — End: 2023-12-09

## 2023-12-09 MED ORDER — POTASSIUM CHLORIDE ER 10 MEQ PO TBCR
10 | ORAL | Status: DC | PRN
Start: 2023-12-09 — End: 2023-12-10

## 2023-12-09 MED ORDER — HEPARIN SODIUM (PORCINE) 1000 UNIT/ML IJ SOLN
1000 | INTRAMUSCULAR | Status: DC | PRN
Start: 2023-12-09 — End: 2023-12-09

## 2023-12-09 MED ORDER — METOPROLOL SUCCINATE ER 25 MG PO TB24
25 | Freq: Every day | ORAL | Status: DC
Start: 2023-12-09 — End: 2023-12-10
  Administered 2023-12-09: 20:00:00 25 mg via ORAL

## 2023-12-09 MED ORDER — VERAPAMIL HCL 2.5 MG/ML IV SOLN
2.5 | INTRAVENOUS | Status: DC | PRN
Start: 2023-12-09 — End: 2023-12-09
  Administered 2023-12-09: 17:00:00 2.5 via INTRA_ARTERIAL

## 2023-12-09 MED ORDER — HEPARIN SODIUM (PORCINE) 1000 UNIT/ML IJ SOLN
1000 | INTRAMUSCULAR | Status: DC | PRN
Start: 2023-12-09 — End: 2023-12-09
  Administered 2023-12-09: 12:00:00 10000 via INTRAVENOUS

## 2023-12-09 MED ORDER — HEPARIN (PORCINE) IN NACL 2000-0.9 UNIT/L-% IV SOLN
2000-0.9 | INTRAVENOUS | Status: AC
Start: 2023-12-09 — End: 2023-12-09

## 2023-12-09 MED ORDER — IOPAMIDOL 76 % IV SOLN
76 | INTRAVENOUS | Status: AC
Start: 2023-12-09 — End: 2023-12-09

## 2023-12-09 MED ORDER — IOPAMIDOL 76 % IV SOLN
76 | INTRAVENOUS | Status: DC | PRN
Start: 2023-12-09 — End: 2023-12-09
  Administered 2023-12-09: 12:00:00 95 via INTRACARDIAC

## 2023-12-09 MED ORDER — VERAPAMIL HCL 2.5 MG/ML IV SOLN
2.5 | INTRAVENOUS | Status: AC
Start: 2023-12-09 — End: 2023-12-09

## 2023-12-09 MED ORDER — IOPAMIDOL 76 % IV SOLN
76 | INTRAVENOUS | Status: DC | PRN
Start: 2023-12-09 — End: 2023-12-09
  Administered 2023-12-09: 18:00:00 80 via INTRACARDIAC

## 2023-12-09 MED ORDER — TICAGRELOR 90 MG PO TABS
90 | Freq: Two times a day (BID) | ORAL | Status: DC
Start: 2023-12-09 — End: 2023-12-10
  Administered 2023-12-10: 10:00:00 90 mg via ORAL

## 2023-12-09 MED ORDER — ACETAMINOPHEN 500 MG PO TABS
500 | ORAL | Status: AC
Start: 2023-12-09 — End: 2023-12-08
  Administered 2023-12-09: 1000 mg via ORAL

## 2023-12-09 MED ORDER — HEPARIN (PORCINE) IN NACL 1000-0.9 UT/500ML-% IV SOLN
1000-0.9 | INTRAVENOUS | Status: AC | PRN
Start: 2023-12-09 — End: 2023-12-09
  Administered 2023-12-09: 17:00:00 1000

## 2023-12-09 MED ORDER — NORMAL SALINE FLUSH 0.9 % IV SOLN
0.9 | INTRAVENOUS | Status: DC | PRN
Start: 2023-12-09 — End: 2023-12-10

## 2023-12-09 MED ORDER — HEPARIN SOD (PORCINE) IN D5W 100 UNIT/ML IV SOLN
100 | INTRAVENOUS | Status: DC
Start: 2023-12-09 — End: 2023-12-09

## 2023-12-09 MED ORDER — MAGNESIUM SULFATE 2000 MG/50 ML IVPB PREMIX
2 | INTRAVENOUS | Status: DC | PRN
Start: 2023-12-09 — End: 2023-12-10

## 2023-12-09 MED ORDER — POLYETHYLENE GLYCOL 3350 17 G PO PACK
17 | Freq: Every day | ORAL | Status: DC | PRN
Start: 2023-12-09 — End: 2023-12-10

## 2023-12-09 MED ORDER — POTASSIUM CHLORIDE 10 MEQ/100ML IV SOLN
10 | INTRAVENOUS | Status: DC | PRN
Start: 2023-12-09 — End: 2023-12-10

## 2023-12-09 MED ORDER — FENTANYL CITRATE PF 50 MCG/ML IJ SOSY
50 | INTRAMUSCULAR | Status: DC | PRN
Start: 2023-12-09 — End: 2023-12-09
  Administered 2023-12-09 (×4): 25 via INTRAVENOUS

## 2023-12-09 MED ORDER — DILTIAZEM HCL ER COATED BEADS 180 MG PO CP24
180 | Freq: Every day | ORAL | Status: DC
Start: 2023-12-09 — End: 2023-12-10
  Administered 2023-12-09 – 2023-12-10 (×2): 180 mg via ORAL

## 2023-12-09 MED ORDER — HEPARIN (PORCINE) IN NACL 1000-0.9 UT/500ML-% IV SOLN
1000-0.9 | INTRAVENOUS | Status: AC
Start: 2023-12-09 — End: 2023-12-09

## 2023-12-09 MED ORDER — PANTOPRAZOLE SODIUM 40 MG PO TBEC
40 | Freq: Every day | ORAL | Status: DC
Start: 2023-12-09 — End: 2023-12-10
  Administered 2023-12-09 – 2023-12-10 (×2): 40 mg via ORAL

## 2023-12-09 MED ORDER — NORMAL SALINE FLUSH 0.9 % IV SOLN
0.9 | Freq: Two times a day (BID) | INTRAVENOUS | Status: DC
Start: 2023-12-09 — End: 2023-12-10
  Administered 2023-12-09 – 2023-12-10 (×2): 10 mL via INTRAVENOUS

## 2023-12-09 MED ORDER — SODIUM CHLORIDE 0.9 % IV SOLN
0.9 | INTRAVENOUS | Status: DC | PRN
Start: 2023-12-09 — End: 2023-12-09

## 2023-12-09 MED ORDER — POTASSIUM BICARB-CITRIC ACID 20 MEQ PO TBEF
20 | ORAL | Status: DC | PRN
Start: 2023-12-09 — End: 2023-12-10

## 2023-12-09 MED ORDER — ENOXAPARIN SODIUM 120 MG/0.8ML IJ SOSY
120 | INTRAMUSCULAR | Status: AC
Start: 2023-12-09 — End: 2023-12-08
  Administered 2023-12-09: 105 mg/kg via SUBCUTANEOUS

## 2023-12-09 MED ORDER — SODIUM CHLORIDE 0.9 % IV SOLN
0.9 | INTRAVENOUS | Status: DC | PRN
Start: 2023-12-09 — End: 2023-12-10

## 2023-12-09 MED ORDER — NITROGLYCERIN 2 % TD OINT
2 | Freq: Four times a day (QID) | TRANSDERMAL | Status: DC
Start: 2023-12-09 — End: 2023-12-10
  Administered 2023-12-09 (×2): 0.5 [in_us] via TOPICAL

## 2023-12-09 MED ORDER — HEPARIN SODIUM (PORCINE) 1000 UNIT/ML IJ SOLN
1000 | INTRAMUSCULAR | Status: DC | PRN
Start: 2023-12-09 — End: 2023-12-09
  Administered 2023-12-09: 18:00:00 5000 via INTRAVENOUS
  Administered 2023-12-09: 17:00:00 3000 via INTRAVENOUS
  Administered 2023-12-09: 17:00:00 7000 via INTRAVENOUS
  Administered 2023-12-09: 18:00:00 2000 via INTRAVENOUS

## 2023-12-09 MED ORDER — HEPARIN SODIUM (PORCINE) 1000 UNIT/ML IJ SOLN
1000 | Freq: Once | INTRAMUSCULAR | Status: AC
Start: 2023-12-09 — End: 2023-12-09
  Administered 2023-12-09: 13:00:00 4000 [IU] via INTRAVENOUS

## 2023-12-09 MED ORDER — TAMSULOSIN HCL 0.4 MG PO CAPS
0.4 | Freq: Every day | ORAL | Status: DC
Start: 2023-12-09 — End: 2023-12-10
  Administered 2023-12-09 – 2023-12-10 (×2): 0.4 mg via ORAL

## 2023-12-09 MED ORDER — ASPIRIN 81 MG PO TBEC
81 | Freq: Every day | ORAL | Status: DC
Start: 2023-12-09 — End: 2023-12-10
  Administered 2023-12-09 – 2023-12-10 (×2): 81 mg via ORAL

## 2023-12-09 MED ORDER — ONDANSETRON HCL 4 MG/2ML IJ SOLN
4 | Freq: Four times a day (QID) | INTRAMUSCULAR | Status: DC | PRN
Start: 2023-12-09 — End: 2023-12-10

## 2023-12-09 MED ORDER — VALSARTAN 160 MG PO TABS
160 | Freq: Every day | ORAL | Status: DC
Start: 2023-12-09 — End: 2023-12-10
  Administered 2023-12-09 – 2023-12-10 (×2): 320 mg via ORAL

## 2023-12-09 MED ORDER — NITROGLYCERIN IN D5W 100-5 MCG/ML-% IV SOLN
100 | INTRAVENOUS | Status: AC
Start: 2023-12-09 — End: 2023-12-09

## 2023-12-09 MED ORDER — FAMOTIDINE 20 MG PO TABS
20 | Freq: Two times a day (BID) | ORAL | Status: DC
Start: 2023-12-09 — End: 2023-12-10
  Administered 2023-12-09 – 2023-12-10 (×3): 20 mg via ORAL

## 2023-12-09 MED ORDER — SODIUM CHLORIDE 0.9 % IV SOLN
0.9 | INTRAVENOUS | Status: AC | PRN
Start: 2023-12-09 — End: 2023-12-09
  Administered 2023-12-09: 17:00:00 25 via INTRAVENOUS

## 2023-12-09 MED ORDER — ACETAMINOPHEN 325 MG PO TABS
325 | ORAL | Status: DC | PRN
Start: 2023-12-09 — End: 2023-12-10
  Administered 2023-12-09: 22:00:00 650 mg via ORAL

## 2023-12-09 MED ORDER — LIDOCAINE HCL 1 % IJ SOLN
1 | INTRAMUSCULAR | Status: AC
Start: 2023-12-09 — End: 2023-12-09

## 2023-12-09 MED ORDER — NORMAL SALINE FLUSH 0.9 % IV SOLN
0.9 | Freq: Two times a day (BID) | INTRAVENOUS | Status: DC
Start: 2023-12-09 — End: 2023-12-10
  Administered 2023-12-09 – 2023-12-10 (×3): 10 mL via INTRAVENOUS

## 2023-12-09 MED ORDER — VERAPAMIL HCL 2.5 MG/ML IV SOLN
2.5 | INTRAVENOUS | Status: DC | PRN
Start: 2023-12-09 — End: 2023-12-09
  Administered 2023-12-09: 11:00:00 5 via INTRA_ARTERIAL

## 2023-12-09 MED ORDER — CETIRIZINE HCL 10 MG PO TABS
10 | Freq: Every day | ORAL | Status: DC
Start: 2023-12-09 — End: 2023-12-10
  Administered 2023-12-09 – 2023-12-10 (×2): 5 mg via ORAL

## 2023-12-09 MED ORDER — TICAGRELOR 90 MG PO TABS
90 | ORAL | Status: DC | PRN
Start: 2023-12-09 — End: 2023-12-09
  Administered 2023-12-09: 18:00:00 180 via ORAL

## 2023-12-09 MED ORDER — MIDAZOLAM HCL 2 MG/2ML IJ SOLN
2 | INTRAMUSCULAR | Status: DC | PRN
Start: 2023-12-09 — End: 2023-12-09
  Administered 2023-12-09: 11:00:00 1 via INTRAVENOUS

## 2023-12-09 MED ORDER — ONDANSETRON 4 MG PO TBDP
4 | Freq: Three times a day (TID) | ORAL | Status: DC | PRN
Start: 2023-12-09 — End: 2023-12-10

## 2023-12-09 MED ORDER — NORMAL SALINE FLUSH 0.9 % IV SOLN
0.9 | Freq: Two times a day (BID) | INTRAVENOUS | Status: DC
Start: 2023-12-09 — End: 2023-12-10
  Administered 2023-12-10: 12:00:00 10 mL via INTRAVENOUS

## 2023-12-09 MED ORDER — DILTIAZEM HCL ER COATED BEADS 120 MG PO CP24
120 | Freq: Every day | ORAL | Status: DC
Start: 2023-12-09 — End: 2023-12-09

## 2023-12-09 MED FILL — DILTIAZEM HCL ER COATED BEADS 180 MG PO CP24: 180 mg | ORAL | Qty: 1

## 2023-12-09 MED FILL — HEPARIN SODIUM (PORCINE) 1000 UNIT/ML IJ SOLN: 1000 [IU]/mL | INTRAMUSCULAR | Qty: 10

## 2023-12-09 MED FILL — METOPROLOL SUCCINATE ER 25 MG PO TB24: 25 mg | ORAL | Qty: 1

## 2023-12-09 MED FILL — ASPIRIN 81 MG PO TBEC: 81 mg | ORAL | Qty: 1

## 2023-12-09 MED FILL — NORMAL SALINE FLUSH 0.9 % IV SOLN: 0.9 % | INTRAVENOUS | Qty: 40

## 2023-12-09 MED FILL — FENTANYL CITRATE (PF) 100 MCG/2ML IJ SOLN: 100 MCG/2ML | INTRAMUSCULAR | Qty: 2

## 2023-12-09 MED FILL — VERAPAMIL HCL 2.5 MG/ML IV SOLN: 2.5 mg/mL | INTRAVENOUS | Qty: 2

## 2023-12-09 MED FILL — HEPARIN (PORCINE) IN NACL 2000-0.9 UNIT/L-% IV SOLN: 2000-0.9 UNIT/L-% | INTRAVENOUS | Qty: 1000

## 2023-12-09 MED FILL — HEPARIN SOD (PORCINE) IN D5W 100 UNIT/ML IV SOLN: 100 [IU]/mL | INTRAVENOUS | Qty: 250

## 2023-12-09 MED FILL — FAMOTIDINE 20 MG PO TABS: 20 mg | ORAL | Qty: 1

## 2023-12-09 MED FILL — XYLOCAINE 1 % IJ SOLN: 1 % | INTRAMUSCULAR | Qty: 20

## 2023-12-09 MED FILL — ACETAMINOPHEN EXTRA STRENGTH 500 MG PO TABS: 500 mg | ORAL | Qty: 2

## 2023-12-09 MED FILL — ACETAMINOPHEN 325 MG PO TABS: 325 mg | ORAL | Qty: 2

## 2023-12-09 MED FILL — MIDAZOLAM HCL 2 MG/2ML IJ SOLN: 2 mg/mL | INTRAMUSCULAR | Qty: 2

## 2023-12-09 MED FILL — TAMSULOSIN HCL 0.4 MG PO CAPS: 0.4 mg | ORAL | Qty: 1

## 2023-12-09 MED FILL — PANTOPRAZOLE SODIUM 40 MG PO TBEC: 40 mg | ORAL | Qty: 1

## 2023-12-09 MED FILL — NITROGLYCERIN IN D5W 100-5 MCG/ML-% IV SOLN: 100 ug/mL | INTRAVENOUS | Qty: 10

## 2023-12-09 MED FILL — ENOXAPARIN SODIUM 120 MG/0.8ML IJ SOSY: 120 MG/0.8ML | INTRAMUSCULAR | Qty: 0.8

## 2023-12-09 MED FILL — ISOVUE-370 76 % IV SOLN: 76 % | INTRAVENOUS | Qty: 200

## 2023-12-09 MED FILL — NITRO-BID 2 % TD OINT: 2 % | TRANSDERMAL | Qty: 1

## 2023-12-09 MED FILL — BRILINTA 90 MG PO TABS: 90 mg | ORAL | Qty: 2

## 2023-12-09 MED FILL — VALSARTAN 160 MG PO TABS: 160 mg | ORAL | Qty: 2

## 2023-12-09 MED FILL — HEPARIN (PORCINE) IN NACL 1000-0.9 UT/500ML-% IV SOLN: 1000-0.9 UT/500ML-% | INTRAVENOUS | Qty: 500

## 2023-12-09 MED FILL — FLUOXETINE HCL 10 MG PO CAPS: 10 mg | ORAL | Qty: 1

## 2023-12-09 MED FILL — CETIRIZINE HCL 10 MG PO TABS: 10 mg | ORAL | Qty: 1

## 2023-12-09 NOTE — Progress Notes (Addendum)
"  0730: Bedside shift change report given to Rocky, CHARITY FUNDRAISER (cabin crew) by Chiquita, RN (offgoing nurse). Report included the following information Nurse Handoff Report, MAR, and Recent Results.     Patient currently in cath lab.    1500: Report received from Gustav, RN on patient Fredrich Cory returning to room 465.    1526: Patient arrived to room 465. Vitals obtained, assessment completed. Left radial site clean, dry, and intact with no bleeding or hematoma noted.    1620: 2mL of air removed.     1640: 2mL of air removed.    1700: 2mL of air removed.    1720: 2mL of air removed.    1750: 2mL of air removed-- Patient noted to have small amount of oozing from left radial site. 2mL of air re-inflated into radial band.     1830: 2mL of air removed.     1907: Patient noted to have small hematoma below TR band. Old drainage noted. Pressure held for 10 minutes. On-call Dr. Maree called and notified of above. Per Dr. Maree, keep TR band in place for 1 hour before removing any additional air.    1930: Bedside shift change report given to Prentice, CHARITY FUNDRAISER (oncoming nurse) by Rocky, RN (offgoing nurse). Report included the following information Nurse Handoff Report, MAR, and Recent Results.   "

## 2023-12-09 NOTE — Progress Notes (Signed)
"  TRANSFER - IN REPORT:    Verbal report received from [facility] on Jeffrey Soto  being received from [facility] for ordered procedure      Report consisted of patient's Situation, Background, Assessment and   Recommendations(SBAR).     Information from the following report(s) Nurse Handoff Report, Adult Overview, and MAR was reviewed with the receiving nurse.    Opportunity for questions and clarification was provided.      Assessment completed upon patient's arrival to unit and care assumed.    "

## 2023-12-09 NOTE — Progress Notes (Signed)
"  4 Eyes Skin Assessment     NAME:  Jeffrey Soto  DATE OF BIRTH:  [birth year]  MEDICAL RECORD NUMBER:  [id number]    The patient is being assessed for  Admission    I agree that at least one RN has performed a thorough Head to Toe Skin Assessment on the patient. ALL assessment sites listed below have been assessed.      Areas assessed by both nurses:    Head, Face, Ears, Shoulders, Back, Chest, Arms, Elbows, Hands, Sacrum. Buttock, Coccyx, Ischium, Legs. Feet and Heels, and Under Medical Devices         Does the Patient have a Wound? No noted wound(s)       Braden Prevention initiated by RN: Yes  Wound Care Orders initiated by RN: No    For hospital-acquired stage 1 & 2 and ALL Stage 3,4, Unstageable, DTI, NWPT, and Complex wounds: place order IP Wound Care/Ostomy Nurse Eval and Treat by RN under ORDER ENTRY: NA    New Ostomies, if present place, Ostomy referral order under ORDER ENTRY: No     Nurse 1 eSignature: Electronically signed by Chiquita Marc, RN on 12/09/23 at 12:38 AM EDT    **SHARE this note so that the co-signing nurse can place an eSignature**    Nurse 2 eSignature: Electronically signed by Ronnald Pitt, RN on 12/09/23 at 12:57 AM EDT   "

## 2023-12-09 NOTE — Progress Notes (Signed)
"  Charting and patient care of Jeffrey Soto by ramon Lacks from [phone] to 0730 was supervised and reviewed by this RN.    "

## 2023-12-09 NOTE — Progress Notes (Signed)
"    Cardiac Cath Lab Procedure Area Arrival Note:    Jeffrey Soto arrived to Cardiac Cath Lab, Procedure Area. Patient identifiers verified with NAME and DATE OF BIRTH. Procedure verified with patient. Consent forms verified. Allergies verified. Patient informed of procedure and plan of care. Questions answered with review. Patient voiced understanding of procedure and plan of care.    Patient on cardiac monitor, non-invasive blood pressure, SPO2 monitor. On RA.  IV of NS on pump at 25 ml/hr. Patient status doing well without problems. Patient is A&Ox 4. Patient reports no CP or SOB.     Patient medicated during procedure with orders obtained and verified by [name].    Refer to patients Cardiac Cath Lab PROCEDURE REPORT for vital signs, assessment, status, and response during procedure, printed at end of case. Printed report on chart or scanned into chart.    "

## 2023-12-09 NOTE — Progress Notes (Signed)
"  Attempted to deliver and verbally explain the MOON/with patient. Patient was with clinical staff. Reena Bihari, Care Management Assistant  "

## 2023-12-09 NOTE — Progress Notes (Signed)
"  CATH LAB to RECOVERY ROOM REPORT    Procedure: LHC w/ iFR wire     MD: D. Moises, MD    The procedure was diagnostic only.    Verbal Report given to bedside nurse on patient being transferred to Cardiac Cath Lab for post-op. Patient stable upon transfer.  Vitals, mental status, MAR, procedural summary discussed with bedside nurse.    Medication given during procedure:    Contrast: 95 mL                          Heparin : 10,000 units     Versed : 1 mg                                                               Fentanyl : 25 mcg    Radial Cocktail:  Verapamil  5 mg                    Other Meds/Drips given:    0.9% NaCl @ 25 mL/hr    Heparin  gtt ordered    Sheaths:    Left radial sheath left in place, sutured and pressure bag applied.  "

## 2023-12-09 NOTE — Procedures (Signed)
"  Cardiac Catheterization Procedure Note   Patient: Jeffrey Soto  MRN: [id number]  SSN: [id number]   Date of Birth: [birth year] Age: 78 y.o.  Sex: male    Date of Procedure: 12/09/2023   Pre-procedure Diagnosis: NSTEMI  Post-procedure Diagnosis: Coronary Artery Disease  Procedure: PCI of the RCA with DES x 3  Operator:  Dr. Yahia Bottger, MD    Assistant(s):  None  Anesthesia: Moderate Sedation   Estimated Blood Loss: Less than 10 mL   Specimens Removed: None    Access: left radial artery    Findings:   As per diagnostic cath report, the culprit lesions were in the distal RCA with stenosis of 99% distally. Tortuous RCA.    RCA Intervention:    Guide: AR4 with SH  Anticoagulation: ACT guided Heparin   Lesion crossing:   Runthrough initially, but had to upgrade wire to a [employer] to deliver stents due to severe tortuosity making it difficult to deliver stents.    IVUS of the RCA was attempted but could not cross even the first turn with IVUS catheter due to severe tortuosity.    PTCA of the distal RCA performed with a 2.5x80mm balloon.  PTCA of the distal and mid RCA performed with a 3.0 x33mm balloon.    [employer] used to help deliver stents, however they would still not track over the Runthrough wire. Wire changed out for [employer] using a administrator, civil service.    A 2.75x55mm DES was implanted in the distal RCA near the bifurcation.  A 3.0x34mm DES was implanted in the distal RCA.  A 3.5x72mm DES was then implanted in the mid RCA.  All stents overlapping.    Initial TIMI flow 3  Final angiography showed excellent results with no residual stenosis, TIMI 3 flow, no dissection.    Complications: None   Implants:  DES x 3  Signed by:  Viva Gallaher, MD  12/09/2023  2:28 PM      "

## 2023-12-09 NOTE — Care Coordination (Signed)
"  Transition of care    Attempted to meet w patient for the initial assessment but he is off the floor in the cath lab.  Patient was discussed in [facility] IDRs and plan is to upgrade him from obs to inpatient status.  He is on a heparin  drip, s/p PCI and anticipated d/c in 1-2 days.     Andriette Deis, MSW CCM  Care Management   Available on [employer] or [phone]   "

## 2023-12-09 NOTE — Progress Notes (Signed)
 "        [facility]                                                                                          Hospitalist Progress Note  Jeffrey Divine, MD  Office Phone: [phone]        Date of Service:  12/09/2023  NAME:  Jeffrey Soto  DOB:  [birth year]  MRN:  [id number]       Admission Summary:   Jeffrey Soto is a 78 y.o. male with past medical history of PAD, hypertension, history of prostate cancer, prostate disorder, GERD who presented to ED with complaints of chest pain.  Had been in his usual state of health until about 3 PM yesterday when shortly after playing golf, he developed mid-chest/substernal, pressure and aching pain with no alleviating or worsening factors.        Interval history / Subjective:   Follow up NSTEMI  S/p Cardiac cath 10/9  Feeling better     Assessment & Plan:     NSTE-ACS  -trop leak to 706  -s/p cardiac cath 10/9 with placement of 3 stents  -ASA/Brilinta /Metoprolol      Hypertension   - PTA valsartan  and diltiazem      History of PAT  - Continue PTA diltiazem      BPH  - PTA Flomax      Mood disorder  - PTA Prozac      GERD  - Protonix  and Pepcid      CRITICAL CARE ATTESTATION:  I had a face to face encounter with the patient, reviewed and interpreted patient data including clinical events, labs, images, vital signs, I/O's, and examined patient.  I have discussed the case and the plan and management of the patient's care with the consulting services, the bedside nurses and necessary ancillary providers.       NOTE OF PERSONAL INVOLVEMENT IN CARE   This patient has a high probability of imminent, clinically significant deterioration, which requires the highest level of preparedness to intervene urgently. I participated in the decision-making and personally managed or directed the management of the following life and organ supporting interventions that required my frequent assessment to treat or prevent imminent deterioration.     I personally spent 38  minutes of critical care time.  This is time spent at this critically ill patient's bedside actively involved in patient care as well as the coordination of care and discussions with the patient's family.  This does not include any procedural time which has been billed separately.       Code status: FULL CODE  Prophylaxis: scd    Plan: Monitor, likely discharge tomorrow  Care Plan discussed with: patient, RN, CM  Anticipated Disposition: home likely tomorrow  Inpatient  Cardiac monitoring: Telemetry  Central Line:            Social Drivers of Health     Tobacco Use: Medium Risk (07/09/2023)    Received from [facility]    Patient History     Smoking Tobacco Use: Former     Smokeless Tobacco Use: Former  Passive Exposure: Not on file   Alcohol Use: Not At Risk (11/23/2022)    AUDIT-C     Frequency of Alcohol Consumption: Monthly or less     Average Number of Drinks: 1 or 2     Frequency of Binge Drinking: Never   Financial Resource Strain: Not on file   Food Insecurity: No Food Insecurity (12/08/2023)    Hunger Vital Sign     Worried About Running Out of Food in the Last Year: Never true     Ran Out of Food in the Last Year: Never true   Transportation Needs: No Transportation Needs (12/08/2023)    PRAPARE - Therapist, Art (Medical): No     Lack of Transportation (Non-Medical): No   Physical Activity: Sufficiently Active (11/23/2022)    Exercise Vital Sign     Days of Exercise per Week: 7 days     Minutes of Exercise per Session: 40 min   Stress: Not on file   Social Connections: Not on file   Intimate Partner Violence: Not on file   Depression: Not at risk (06/28/2023)    PHQ-2     PHQ-2 Score: 0   Housing Stability: Low Risk  (12/08/2023)    Housing Stability Vital Sign     Unable to Pay for Housing in the Last Year: No     Number of Times Moved in the Last Year: 0     Homeless in the Last Year: No   Interpersonal Safety: Not At Risk (12/08/2023)    Interpersonal Safety Domain Source: IP  Abuse Screening     Physical abuse: Denies     Verbal abuse: Denies     Emotional abuse: Denies     Financial abuse: Denies     Sexual abuse: Denies   Utilities: Not At Risk (12/08/2023)    [facility] Utilities     Threatened with loss of utilities: No       Review of Systems:   Pertinent items are noted in HPI.       Vital Signs:    Last 24hrs VS reviewed since prior progress note. Most recent are:  Vitals:    12/09/23 1715   BP: (!) 140/87   Pulse: 71   Resp: 22   Temp:    SpO2: 97%         Intake/Output Summary (Last 24 hours) at 12/09/2023 1743  Last data filed at 12/09/2023 1526  Gross per 24 hour   Intake 660 ml   Output 790 ml   Net -130 ml        Physical Examination:     I had a face to face encounter with this patient and independently examined them on 12/09/2023 as outlined below:          General : alert x 3, awake, no acute distress,   HEENT: PEERL, EOMI, moist mucus membrane, TM clear  Neck: supple, no JVD, no meningeal signs  Chest: Clear to auscultation bilaterally   CVS: S1 S2 heard, Capillary refill less than 2 seconds  Abd: soft/ non tender, non distended, BS physiological,   Ext: no clubbing, no cyanosis, no edema, brisk 2+ DP pulses  Neuro/Psych: pleasant mood and affect, CN 2-12 grossly intact, sensory grossly within normal limit, Strength 5/5 in all extremities, DTR 1+ x 4  Skin: warm     Data Review:    Review and/or order of clinical lab test  I have personally and independently reviewed all pertinent labs, diagnostic studies, imaging, and have provided independent interpretation of the same.     Labs:     Recent Labs     12/08/23  1736   WBC 9.0   HGB 14.2   HCT 41.7   PLT 193     Recent Labs     12/08/23  1736   NA 140   K 3.7   CL 102   CO2 27   BUN 20   MG 2.0     Recent Labs     12/08/23  1736   ALT 47   GLOB 3.0     No results for input(s): INR, APTT in the last 72 hours.    Invalid input(s): PTP   No results for input(s): TIBC in the last 72 hours.    Invalid input(s): FE, PSAT,  FERR   No results found for: RBCF   No results for input(s): PH, PCO2, PO2 in the last 72 hours.  No results for input(s): CPK in the last 72 hours.    Invalid input(s): CPKMB, CKNDX, TROIQ  Lab Results   Component Value Date/Time    CHOL 215 12/09/2023 02:43 AM    HDL 35 12/09/2023 02:43 AM    LDL 129 12/09/2023 02:43 AM    LDL 105 05/30/2021 03:03 PM     No results found for: GLUCPOC  @LABUA @    Notes reviewed from all clinical/nonclinical/nursing services involved in patient's clinical care. Care coordination discussions were held with appropriate clinical/nonclinical/ nursing providers based on care coordination needs.         Patients current active Medications were reviewed, considered, added and adjusted based on the clinical condition today.      Home Medications were reconciled to the best of my ability given all available resources at the time of admission. Route is PO if not otherwise noted.      Admission Status:[id number]:::1}      Medications Reviewed:     Current Facility-Administered Medications   Medication Dose Route Frequency    famotidine  (PEPCID ) tablet 20 mg  20 mg Oral BID    FLUoxetine  (PROZAC ) capsule 10 mg  10 mg Oral Daily    pantoprazole  (PROTONIX ) tablet 40 mg  40 mg Oral QAM AC    tamsulosin  (FLOMAX ) capsule 0.4 mg  0.4 mg Oral Daily    valsartan  (DIOVAN ) tablet 320 mg  320 mg Oral Daily    sodium chloride  flush 0.9 % injection 5-40 mL  5-40 mL IntraVENous 2 times per day    sodium chloride  flush 0.9 % injection 5-40 mL  5-40 mL IntraVENous PRN    potassium chloride  (KLOR-CON ) extended release tablet 40 mEq  40 mEq Oral PRN    Or    potassium bicarb-citric acid  (EFFER-K) effervescent tablet 40 mEq  40 mEq Oral PRN    Or    potassium chloride  10 mEq/100 mL IVPB (Peripheral Line)  10 mEq IntraVENous PRN    magnesium  sulfate 2000 mg in 50 mL IVPB premix  2,000 mg IntraVENous PRN    ondansetron  (ZOFRAN -ODT) disintegrating tablet 4 mg  4 mg Oral Q8H PRN    Or    ondansetron   (ZOFRAN ) injection 4 mg  4 mg IntraVENous Q6H PRN    polyethylene glycol (GLYCOLAX ) packet 17 g  17 g Oral Daily PRN    acetaminophen  (TYLENOL ) suppository 650 mg  650 mg Rectal Q6H PRN    dilTIAZem  (CARDIZEM  CD) extended  release capsule 180 mg  180 mg Oral Daily    metoprolol  succinate (TOPROL  XL) extended release tablet 25 mg  25 mg Oral Daily    aspirin  EC tablet 81 mg  81 mg Oral Daily    sodium chloride  flush 0.9 % injection 5-40 mL  5-40 mL IntraVENous 2 times per day    sodium chloride  flush 0.9 % injection 5-40 mL  5-40 mL IntraVENous PRN    sodium chloride  flush 0.9 % injection 5-40 mL  5-40 mL IntraVENous 2 times per day    sodium chloride  flush 0.9 % injection 5-40 mL  5-40 mL IntraVENous PRN    0.9 % sodium chloride  infusion   IntraVENous PRN    acetaminophen  (TYLENOL ) tablet 650 mg  650 mg Oral Q4H PRN    [START ON 12/10/2023] ticagrelor  (BRILINTA ) tablet 90 mg  90 mg Oral BID    cetirizine  (ZYRTEC ) tablet 5 mg  5 mg Oral Daily    nitroglycerin  (NITRO-BID ) 2 % ointment 0.5 inch  0.5 inch Topical 4 times per day     ______________________________________________________________________  EXPECTED LENGTH OF STAY: 2  ACTUAL LENGTH OF STAY:          0                 Jeffrey Divine, MD    "

## 2023-12-09 NOTE — Plan of Care (Signed)
"    Problem: Discharge Planning  Goal: Discharge to home or other facility with appropriate resources  Outcome: Progressing     Problem: Safety - Adult  Goal: Free from fall injury  12/09/2023 2026 by Gaile Barter, RN  Outcome: Progressing  12/09/2023 1636 by Larose Rocky DASEN, RN  Outcome: Progressing     Problem: Pain  Goal: Verbalizes/displays adequate comfort level or baseline comfort level  Outcome: Progressing     "

## 2023-12-09 NOTE — Progress Notes (Signed)
"  TRANSFER - IN Cath Lab RR REPORT:    Verbal report received from Mica on KEVONTE VANECEK  being received from the Cardiac Cath Lab for routine progression of care. Report consisted of patients Situation, Background, Assessment and Recommendations(SBAR). Procedural summary and MAR reviewed with receiving nurse. Opportunity for questions and clarification was provided. Assessment completed upon patients arrival to Cardiac Cath Lab RECOVERY AREA and care assumed.       Cardiac Cath Lab Recovery Arrival Note:    RONDELL PARDON arrived to CCL recovery area.  Patient procedure= LHC/PCI. Patient on cardiac monitor, non-invasive blood pressure, SPO2 monitor. On RA.  IV  of NS on pump at [facility] ml/hr. Patient is A&Ox 4. Patient reports no pain.    PROCEDURE SITE CHECK:    Procedure site: No bleeding and no hematoma, no pain/discomfort reported at procedure site.     No change in patient status. Continue to monitor patient and status.  "

## 2023-12-09 NOTE — H&P (Addendum)
 "  History & Physical    Primary Care Provider: Rosamond LELON Jeffrey MADISON, Soto  Source of Information: Patient and chart review    History of Presenting Illness:   Jeffrey Soto is a 78 y.o. male with past medical history of PAD, hypertension, history of prostate cancer, prostate disorder, GERD who presented to ED with complaints of chest pain.  Had been in his usual state of health until about 3 PM yesterday when shortly after playing golf, he developed mid-chest/substernal, pressure and aching pain with no alleviating or worsening factors.    The patient denies any fever, chills, abdominal pain, nausea, vomiting, cough, congestion, recent illness, palpitations, or dysuria.  Remarkable vitals on ER Presentation: bp up to 197/97  Labs Remarkable for: trop 89-->706  ER Images: cxr: no acute process  ER Rx: asa, lovenox , nitro     Review of Systems:  Pertinent items are noted in the History of Present Illness.     Past Medical History:   Diagnosis Date    Arrhythmia     Hx PAT. dr. Chanda evaluated.     Bursitis of both shoulders     Depression     Elevated lipids 12/16/2009    Fatty liver     Gallbladder polyp     GERD (gastroesophageal reflux disease) 06/26/2003    EGD: duodenitis , dilation of Esophageal ring , DR. [name]    HTN (hypertension) 12/16/2009    Prostate cancer (HCC) 06/02/2017    GLeason 3+ 3= 6 . 2019 . Dr. Bennet Soto      Past Surgical History:   Procedure Laterality Date    CATARACT EXTRACTION Bilateral     COLONOSCOPY  06/26/03    diverticulosis, polypectomy, f/u dr. Alray     COLONOSCOPY N/A 03/25/2022    COLONOSCOPY DIAGNOSTIC performed by Jeffrey Soto at [facility]    GI      right inguinal hernia repair x3    OTHER SURGICAL HISTORY      removal pilonidal cyst    UROLOGICAL SURGERY  05/25/2017    Prostate biopsy by Jeffrey Soto    UROLOGICAL SURGERY      right orchiectomy for benign growth     Prior to Admission medications   Medication Sig Start Date End Date Taking? Authorizing Provider    FLUoxetine  (PROZAC ) 10 MG capsule TAKE 1 CAPSULE BY MOUTH DAILY 10/31/23  Yes Jeffrey LELON Jeffrey IV, Soto   dilTIAZem  (CARDIZEM  CD) 300 MG extended release capsule TAKE 1 CAPSULE BY MOUTH DAILY 10/15/23  Yes Jeffrey LELON Jeffrey IV, Soto   DICLOFENAC PO Take 75 mg by mouth as needed in the morning and 75 mg as needed in the evening (joint or back pain).   Yes Provider, Historical, Soto   famotidine  (PEPCID ) 20 MG tablet TAKE 1 TABLET BY MOUTH 2 TIMES A DAY 07/31/23  Yes Jeffrey LELON Jeffrey IV, Soto   valsartan  (DIOVAN ) 320 MG tablet Take 1 tablet by mouth daily In place of Lisinopril  03/17/23  Yes Jeffrey LELON Jeffrey IV, Soto   omeprazole (PRILOSEC) 20 MG delayed release capsule Take 1 capsule by mouth daily   Yes Provider, Historical, Soto   tamsulosin  (FLOMAX ) 0.4 MG capsule Take 1 capsule by mouth   Yes Automatic Reconciliation, Ar   pravastatin  (PRAVACHOL ) 10 MG tablet One pill each Monday for the first 2 weeks. After that , one pill twice weekly on Monday and Thursday 07/06/23   Hendrix, W Cliff IV, Soto  METAMUCIL FIBER PO Take by mouth 2 tablespoonsful daily    Provider, Historical, Soto   furosemide  (LASIX ) 20 MG tablet TAKE 1 TABLET BY MOUTH EVERY OTHER DAY IN THE MORNING AS NEEDED FOR LEG SWELLING  Patient not taking: Reported on 06/28/2023 10/13/22   Jeffrey LELON Jeffrey MADISON, Soto   dilTIAZem  (TIAZAC ) 300 MG extended release capsule Take 1 capsule by mouth daily 09/27/14   Automatic Reconciliation, Ar     Allergies   Allergen Reactions    Lipitor [Atorvastatin ]      Arthralgia     Rosuvastatin       Arthralgia       Family History   Problem Relation Age of Onset    Heart Disease Brother 29        sudden death  in sleep    Cancer Sister         lung    Cancer Sister         lung    Lung Disease Father         emphysema        SOCIAL HISTORY:  Patient resides:  Independently x   Assisted Living    SNF    With family care       Smoking history:   None x   Former    Chronic      Alcohol history:   None x   Social    Chronic      Ambulates:    Independently x   w/cane    w/walker    w/wc    CODE STATUS:  DNR    Full x   Other      Objective:     Physical Exam:     BP (!) 143/80   Pulse 64   Temp 98 F (36.7 C) (Oral)   Resp 12   Ht 1.88 m (6' 2)   Wt 102.6 kg (226 lb 3.1 oz)   SpO2 95%   BMI 29.04 kg/m         General:  Alert, cooperative, no distress, appears stated age.   Head:  Normocephalic, without obvious abnormality, atraumatic.   Eyes:  Conjunctivae/corneas clear. PERRL, EOMs intact.   Nose: Nares normal. Septum midline. Mucosa normal.        Neck: Supple, symmetrical, trachea midline.       Lungs:   Clear to auscultation bilaterally.   Chest wall:  No tenderness or deformity.   Heart:  Regular rate and rhythm, S1, S2 normal   Abdomen:   Soft, non-tender. Bowel sounds normal. No masses,  No organomegaly.   Extremities: Extremities normal, atraumatic, no cyanosis or edema.   Pulses: 2+ and symmetric all extremities.   Skin: Skin color, texture, turgor normal. No rashes or lesions   Neurologic: CNII-XII grossly intact.          EKG:  nsr w/ 1st degree av block  Data Review:     Recent Days:  Recent Labs     12/08/23  1736   WBC 9.0   HGB 14.2   HCT 41.7   PLT 193     Recent Labs     12/08/23  1736   NA 140   K 3.7   CL 102   CO2 27   BUN 20   MG 2.0   ALT 47     No results for input(s): PH, PCO2, PO2, HCO3, FIO2 in the last 72 hours.    24  Hour Results:  Recent Results (from the past 24 hours)   EKG 12 Lead    Collection Time: 12/08/23  5:26 PM   Result Value Ref Range    Ventricular Rate 64 BPM    Atrial Rate 64 BPM    P-R Interval 222 ms    QRS Duration 102 ms    Q-T Interval 388 ms    QTc Calculation (Bazett) 400 ms    P Axis 62 degrees    R Axis -53 degrees    T Axis 30 degrees    Diagnosis       Sinus rhythm with 1st degree AV block  Incomplete right bundle branch block  Left anterior fascicular block  Moderate voltage criteria for LVH, may be normal variant ( R in aVL , [employer]   product )  Septal infarct , age  undetermined  Abnormal ECG  When compared with ECG of 09-Jun-1999 08:45,  PR interval has increased  Septal infarct is now present     CBC with Auto Differential    Collection Time: 12/08/23  5:36 PM   Result Value Ref Range    WBC 9.0 4.1 - 11.1 K/uL    RBC 4.81 4.10 - 5.70 M/uL    Hemoglobin 14.2 12.1 - 17.0 g/dL    Hematocrit 58.2 63.3 - 50.3 %    MCV 86.7 80.0 - 99.0 FL    MCH 29.5 26.0 - 34.0 PG    MCHC 34.1 30.0 - 36.5 g/dL    RDW 86.3 88.4 - 85.4 %    Platelets 193 150 - 400 K/uL    MPV 10.1 8.9 - 12.9 FL    Nucleated RBCs 0.0 0 PER 100 WBC    nRBC 0.00 0.00 - 0.01 K/uL    Neutrophils % 72.8 32.0 - 75.0 %    Lymphocytes % 14.2 12.0 - 49.0 %    Monocytes % 11.1 5.0 - 13.0 %    Eosinophils % 1.0 0.0 - 7.0 %    Basophils % 0.6 0.0 - 1.0 %    Immature Granulocytes % 0.3 0.0 - 0.5 %    Neutrophils Absolute 6.57 1.80 - 8.00 K/UL    Lymphocytes Absolute 1.28 0.80 - 3.50 K/UL    Monocytes Absolute 1.00 0.00 - 1.00 K/UL    Eosinophils Absolute 0.09 0.00 - 0.40 K/UL    Basophils Absolute 0.05 0.00 - 0.10 K/UL    Immature Granulocytes Absolute 0.03 0.00 - 0.04 K/UL    Differential Type AUTOMATED     Comprehensive Metabolic Panel    Collection Time: 12/08/23  5:36 PM   Result Value Ref Range    Sodium 140 136 - 145 mmol/L    Potassium 3.7 3.5 - 5.1 mmol/L    Chloride 102 97 - 108 mmol/L    CO2 27 21 - 32 mmol/L    Anion Gap 11 2 - 12 mmol/L    Glucose 103 (H) 65 - 100 mg/dL    BUN 20 6 - 20 MG/DL    Creatinine 8.90 9.29 - 1.30 MG/DL    BUN/Creatinine Ratio 18 12 - 20      Est, Glom Filt Rate 70 >60 ml/min/1.70m2    Calcium  8.9 8.5 - 10.1 MG/DL    Total Bilirubin 0.7 0.2 - 1.0 MG/DL    ALT 47 12 - 78 U/L    AST 28 15 - 37 U/L    Alk Phosphatase 57 45 - 117 U/L    Total Protein  7.1 6.4 - 8.2 g/dL    Albumin 4.1 3.5 - 5.0 g/dL    Globulin 3.0 2.0 - 4.0 g/dL    Albumin/Globulin Ratio 1.4 1.1 - 2.2     Lipase    Collection Time: 12/08/23  5:36 PM   Result Value Ref Range    Lipase 45 13 - 75 U/L   Magnesium     Collection  Time: 12/08/23  5:36 PM   Result Value Ref Range    Magnesium  2.0 1.6 - 2.4 mg/dL   Troponin    Collection Time: 12/08/23  5:36 PM   Result Value Ref Range    Troponin, High Sensitivity 89 (H) 0 - 76 ng/L   Extra Tubes Hold    Collection Time: 12/08/23  5:36 PM   Result Value Ref Range    Specimen HOld 1red,1blu     Comment:        Add-on orders for these samples will be processed based on acceptable specimen integrity and analyte stability, which may vary by analyte.   Troponin    Collection Time: 12/08/23  7:37 PM   Result Value Ref Range    Troponin, High Sensitivity 706 (HH) 0 - 76 ng/L         Imaging:     Assessment:     Jeffrey Soto is a 78 y.o. male with past medical history of PAD, hypertension, history of prostate cancer, prostate disorder, GERD who is admitted for nstemi.       Plan:       NSTE-ACS  -trop leak to 706  -received therapaeutic Lovenox  in ED  -cont w/ serial trops  -keep npo  -check A1c, lipids, and tsh  -cardiology consult in am    Hypertension   - PTA valsartan  and diltiazem     History of PAT  - Continue PTA diltiazem     BPH  - PTA Flomax     Mood disorder  - PTA Prozac     GERD  - Protonix  and Pepcid         FEN/GI -  ns@0ml /hr  Activity - as tolerated  DVT prophylaxis - Lovenox   GI prophylaxis -  none indicated  Disposition - home    CODE STATUS:   full code    CRITICAL CARE ATTESTATION:  I had a face to face encounter with the patient, reviewed and interpreted patient data including clinical events, labs, images, vital signs, I/O's, and examined patient.  I have discussed the case and the plan and management of the patient's care with the consulting services, the bedside nurses and necessary ancillary providers.       NOTE OF PERSONAL INVOLVEMENT IN CARE   This patient has a high probability of imminent, clinically significant deterioration, which requires the highest level of preparedness to intervene urgently. I participated in the decision-making and personally managed or directed the  management of the following life and organ supporting interventions that required my frequent assessment to treat or prevent imminent deterioration.     I personally spent 45 minutes of critical care time.  This is time spent at this critically ill patient's bedside actively involved in patient care as well as the coordination of care and discussions with the patient's family.  This does not include any procedural time which has been billed separately.         Signed By: Clydene Sims, Soto     December 09, 2023         "

## 2023-12-09 NOTE — Progress Notes (Addendum)
"  TRANSFER - IN REPORT:    Verbal report received from [place] on HURSHEL BOUILLON  being received from left cardiac catheterization for routine progression of patient care. Report consisted of patients Situation, Background, Assessment and Recommendations(SBAR). Information from the following report(s) Procedure Summary was reviewed with the receiving clinician. Opportunity for questions and clarification was provided. Assessment completed upon patients arrival to Cardiac Cath Lab RECOVERY AREA and care assumed.       Cardiac Cath Lab Recovery Arrival Note:    GODFREY TRITSCHLER arrived to CCL recovery area.  Patient procedure= left cardiac catheterization. Patient on cardiac monitor, non-invasive blood pressure, SPO2 monitor. On room air.   IV  of Normal saline @ KVO.  Patient status doing well without problems. Patient is A&Ox 4. Patient reports no pain.    PROCEDURE SITE CHECK:    Procedure site:without any bleeding and no hematoma, no pain/discomfort reported at procedure site.     No change in patient status. Continue to monitor patient and status.  #6 French sheath in place left radial artery to pressure bag. Plan is to have PCi this afternoon so will continue to monitor patient. Heparin  drip to be started until PCI done this afternoon. Arterial waveform on monitor with good waveform noted (zeroed).              "

## 2023-12-09 NOTE — Progress Notes (Signed)
"  TRANSFER - OUT Cardiac Cath RR REPORT:    Verbal report given to Erin RN on Jeffrey Soto  being transferred to [facility] for routine progression of patient care       Report consisted of patient's Situation, Background, Assessment and   Recommendations(SBAR).     Information from the following report(s) Nurse Handoff Report was reviewed with the receiving nurse.  Kinder Assessment: Presents to emergency department  because of falls (Syncope, seizure, or loss of consciousness): No, Age > 70: Yes, Altered Mental Status, Intoxication with alcohol or substance confusion (Disorientation, impaired judgment, poor safety awaremess, or inability to follow instructions): No, Impaired Mobility: Ambulates or transfers with assistive devices or assistance; Unable to ambulate or transer.: No, Nursing Judgement: No  Lines:   Peripheral IV 12/08/23 Left Antecubital (Active)   Site Assessment Clean, dry & intact 12/09/23 0315   Line Status Capped;Flushed;Normal saline locked 12/09/23 0315   Line Care Cap changed;Connections checked and tightened 12/09/23 0315   Phlebitis Assessment No symptoms 12/09/23 0315   Infiltration Assessment 0 12/09/23 0315   Alcohol Cap Used Yes 12/09/23 0315   Dressing Status Clean, dry & intact 12/09/23 0315   Dressing Type Transparent 12/09/23 0315   Dressing Intervention New 12/08/23 1744        Opportunity for questions and clarification was provided.      Patient transported with:  Monitor and Registered Nurse       "

## 2023-12-09 NOTE — Procedures (Signed)
"  Cardiac Catheterization Procedure Note   Patient: Jeffrey Soto  MRN: [id number]  SSN: [id number]   Date of Birth: 1945-07-30 Age: 78 y.o.  Sex: male    Date of Procedure: 12/09/2023   Pre-procedure Diagnosis: NSTEMI  Post-procedure Diagnosis: Coronary Artery Disease  Procedure: Coronary angiography, iFR of D1, iFR of LCX OM1  Operator:  Dr. Irl Hal, MD    Assistant(s):  None  Anesthesia: Moderate Sedation   Estimated Blood Loss: Less than 10 mL   Specimens Removed: None    Access: left radial artery  Sheath sutured in place at the end of the case and connected to pressure for planned PCI of RCA    Findings:   Left main: mild disease  LAD: moderate 50% stenosis prox LAD. D1 moderate diffuse disease  LCX: moderate diffuse disease. OM1: 70% proximal stenosis  RCA: tortuous, severe multifocal disease in the distal RCA 90-99%. PDA small, diffuse disease.     iFR of the D1 performed: 0.99  iFR of OM1 performed: 0.93  Unable to get the wire to advance down the LAD for iFR    Plan:  Complex PCI of the RCA to be done this afternoon.   Continue IV heparin .  Monitor in CCU    Complications: None   Implants:  None  Signed by:  Irl Hal, MD  12/09/2023  8:08 AM          "

## 2023-12-09 NOTE — Consults (Signed)
 "    [facility] Cardiology Consult    Date of consult:  12/09/23  Date of admission: 12/08/2023  Primary Cardiologist: none  Physician Requesting consult: Dr Donne    CC / Reason for consult: NSTEMI    History of the presenting illness:  Jeffrey Soto is a 78 y.o. M with no prior cardiac history, who presented yesterday evening to the [place] ER with chest pain.    He had just finished playing golf, and after taking a shower he noticed onset of discomfort in the center of the chest. There was no radiation. It was not relieved by sitting or lying down. It eventually subsided after about a duration of 30 minutes. He has not had that type of pain before.    Troponin was elevated, so he was sent to [facility] further evaluation.     Past Medical History:   Diagnosis Date    Arrhythmia     Hx PAT. dr. Chanda evaluated.     Bursitis of both shoulders     Depression     Elevated lipids 12/16/2009    Fatty liver     Gallbladder polyp     GERD (gastroesophageal reflux disease) 06/26/2003    EGD: duodenitis , dilation of Esophageal ring , DR. [name]    HTN (hypertension) 12/16/2009    Prostate cancer (HCC) 06/02/2017    GLeason 3+ 3= 6 . 2019 . Dr. Bennet Mango       Prior to Admission medications   Medication Sig Start Date End Date Taking? Authorizing Provider   FLUoxetine  (PROZAC ) 10 MG capsule TAKE 1 CAPSULE BY MOUTH DAILY 10/31/23  Yes Rosamond LELON Pennant IV, MD   dilTIAZem  (CARDIZEM  CD) 300 MG extended release capsule TAKE 1 CAPSULE BY MOUTH DAILY 10/15/23  Yes Rosamond LELON Pennant IV, MD   DICLOFENAC PO Take 75 mg by mouth as needed in the morning and 75 mg as needed in the evening (joint or back pain).   Yes [provider]   famotidine  (PEPCID ) 20 MG tablet TAKE 1 TABLET BY MOUTH 2 TIMES A DAY 07/31/23  Yes Rosamond LELON Pennant IV, MD   valsartan  (DIOVAN ) 320 MG tablet Take 1 tablet by mouth daily In place of Lisinopril  03/17/23  Yes Rosamond LELON Pennant IV, MD   omeprazole (PRILOSEC) 20 MG delayed release capsule Take 1 capsule by  mouth daily   Yes [provider]   tamsulosin  (FLOMAX ) 0.4 MG capsule Take 1 capsule by mouth   Yes Automatic Reconciliation, Ar   pravastatin  (PRAVACHOL ) 10 MG tablet One pill each Monday for the first 2 weeks. After that , one pill twice weekly on Monday and Thursday 07/06/23   Hendrix, W Cliff IV, MD   METAMUCIL FIBER PO Take by mouth 2 tablespoonsful daily    [provider]   furosemide  (LASIX ) 20 MG tablet TAKE 1 TABLET BY MOUTH EVERY OTHER DAY IN THE MORNING AS NEEDED FOR LEG SWELLING  Patient not taking: Reported on 06/28/2023 10/13/22   Rosamond LELON Pennant MADISON, MD   dilTIAZem  (TIAZAC ) 300 MG extended release capsule Take 1 capsule by mouth daily 09/27/14   Automatic Reconciliation, Ar       Family History   Problem Relation Age of Onset    Heart Disease Brother 46        sudden death  in sleep    Cancer Sister         lung    Cancer Sister  lung    Lung Disease Father         emphysema       Social History     Socioeconomic History    Marital status: Married     Spouse name: Not on file    Number of children: Not on file    Years of education: Not on file    Highest education level: Not on file   Occupational History    Not on file   Tobacco Use    Smoking status: Former    Smokeless tobacco: Never   Substance and Sexual Activity    Alcohol use: Yes    Drug use: Not on file    Sexual activity: Not on file   Other Topics Concern    Not on file   Social History Narrative    Not on file     Social Drivers of Health     Financial Resource Strain: Not on file   Food Insecurity: No Food Insecurity (12/08/2023)    Hunger Vital Sign     Worried About Running Out of Food in the Last Year: Never true     Ran Out of Food in the Last Year: Never true   Transportation Needs: No Transportation Needs (12/08/2023)    PRAPARE - Therapist, Art (Medical): No     Lack of Transportation (Non-Medical): No   Physical Activity: Sufficiently Active (11/23/2022)    Exercise Vital Sign      Days of Exercise per Week: 7 days     Minutes of Exercise per Session: 40 min   Stress: Not on file   Social Connections: Not on file   Intimate Partner Violence: Not on file   Housing Stability: Low Risk  (12/08/2023)    Housing Stability Vital Sign     Unable to Pay for Housing in the Last Year: No     Number of Times Moved in the Last Year: 0     Homeless in the Last Year: No       Review of Systems   Constitutional:  Negative for diaphoresis and fever.   HENT:  Negative for rhinorrhea and trouble swallowing.    Respiratory:  Negative for shortness of breath.    Cardiovascular:  Positive for chest pain. Negative for palpitations and leg swelling.   Gastrointestinal:  Negative for abdominal pain, nausea and vomiting.   Genitourinary:  Negative for frequency and urgency.   Musculoskeletal:  Negative for back pain and myalgias.   Skin:  Negative for rash and wound.   Neurological:  Negative for dizziness and weakness.   Psychiatric/Behavioral:  Negative for confusion and hallucinations.          BP (!) 158/86   Pulse 60   Temp 98.1 F (36.7 C) (Oral)   Resp 11   Ht 1.88 m (6' 2)   Wt 102.6 kg (226 lb 3.1 oz)   SpO2 98%   BMI 29.04 kg/m   Physical Exam  Vitals reviewed.   Constitutional:       General: He is not in acute distress.     Appearance: He is obese.   HENT:      Head: Normocephalic.   Eyes:      Conjunctiva/sclera: Conjunctivae normal.   Cardiovascular:      Rate and Rhythm: Normal rate and regular rhythm.      Heart sounds: Normal heart sounds. No murmur heard.  Pulmonary:  Effort: Pulmonary effort is normal. No respiratory distress.      Breath sounds: Normal breath sounds.   Abdominal:      General: Abdomen is flat.      Palpations: Abdomen is soft.   Musculoskeletal:         General: No swelling or deformity.   Skin:     General: Skin is warm and dry.   Neurological:      General: No focal deficit present.      Mental Status: He is alert. Mental status is at baseline.         Lab  review:  Recent Results (from the past 12 hours)   Troponin    Collection Time: 12/08/23  7:37 PM   Result Value Ref Range    Troponin, High Sensitivity 706 (HH) 0 - 76 ng/L   Troponin    Collection Time: 12/09/23  2:43 AM   Result Value Ref Range    Troponin T 264.0 (HH) 0 - 22 ng/L   Lipid Panel    Collection Time: 12/09/23  2:43 AM   Result Value Ref Range    Cholesterol, Total 215 (H) 0 - 200 MG/DL    Triglycerides 743 (H) 0 - 150 MG/DL    HDL 35 (L) 40 - 60 MG/DL    LDL Cholesterol 870 (H) 0 - 100 MG/DL    VLDL Cholesterol Calculated 51 MG/DL    Chol/HDL Ratio 6.2 (H) 0.0 - 5.0     TSH    Collection Time: 12/09/23  2:43 AM   Result Value Ref Range    TSH, 3rd Generation 3.310 0.270 - 4.200 uIU/mL   Hemoglobin A1C    Collection Time: 12/09/23  2:44 AM   Result Value Ref Range    Hemoglobin A1C 5.8 (H) 4.0 - 5.6 %    Estimated Avg Glucose 119 mg/dL   Troponin    Collection Time: 12/09/23  5:09 AM   Result Value Ref Range    Troponin T 267.0 (HH) 0 - 22 ng/L         Data review:  EKG tracing personally reviewed:   NSR with 1st degree AV block  LAFB. Incomplete RBBB  No significant ST segment changes.    Chest Xray  Image personally reviewed  IMPRESSION:     Normal PA and lateral chest views.       Assessment & Recommendations:    NSTEMI  Episode of chest pain concerning for ACS  Troponin elevation c/w NSTEMI  Agree with aspirin  and lovenox  (last dose 8pm)  Will need beta blocker, statin  Coronary angiography +/- PCI this morning, with early invasive strategy given high risk features (Tn+)  Echocardiogram    Hyperlipidemia  Goal LDL < 70  Currently at 129  Apparently has intolerance to atorvastatin  and rosuvastatin  due to arthralgia side effects  On pravastatin  at very low dose.  Will likely benefit from PCSK9i - will try to get him on Repatha as soon as possible. Adding ezetimibe for now.    Case discussed with Dr Chyrl Rocks (ER Attending)    Signed:  Irl Hal, MD  Interventional Cardiologist  [facility]  12/09/2023  "

## 2023-12-09 NOTE — Progress Notes (Signed)
"  Handoff complete with [place]. Patient A&Ox4. Patient able to ambulate without difficulty to hospital room bed. Looked at left radial TR band with Engineer, Mining. No bleeding or hematoma noted at the time of handoff. Twyla Rocky RN the TR band flow sheet.   "

## 2023-12-09 NOTE — Progress Notes (Signed)
"    Cardiac Cath Lab Procedure Area Arrival Note:    Jeffrey Soto  arrived to Cardiac Cath Lab, Procedure Area. Patient identifiers verified with NAME and DATE OF BIRTH. Procedure verified with patient. Consent forms verified & signed. Allergies verified. Patient informed of procedure and plan of care. Questions answered with review. Patient voiced understanding of procedure and plan of care.    Patient on cardiac monitor, non-invasive blood pressure, SPO2 monitor, nasal cannula at 1 LPM & NaCl 0.9% at 25 mL/hr. Patient is awake, alert and oriented x 4 & no complaints of pain.     Patient medicated during procedure with orders obtained and verified by D. Moises, MD    Refer to patients Cardiac Cath Lab Procedure Report for vital signs, assessment, status, and response during procedure, printed at end of case. Printed report on chart or scanned into chart.    "

## 2023-12-09 NOTE — Progress Notes (Signed)
"  CATH LAB to RECOVERY ROOM REPORT    Procedure: PCI with Stent Placement    MD: D. Moises, MD    The procedure was an intervention with 3 stent(s) placed to the RCA.    Verbal Report given to Recovery Nurse on patient being transferred to Cardiac Cath Lab RR for routine post-op. Patient stable upon transfer to RR.  Vitals, mental status, MAR, procedural summary discussed with recovery RN.    Medication given during procedure:    Contrast: 80mL                          Heparin : 17000units     Versed : 4mg                                                                Fentanyl : 100mcg                                                           Brilinta : 180mg     Other Meds/Drips given:          Sheaths:    Left radial sheath pulled at 1425 pm, band at 12mL of air              "

## 2023-12-09 NOTE — Plan of Care (Addendum)
"  2300 Pt arrived via [facility] from [facility].     2317 Notified Bleck MD of arrival and elevated blood pressure.     Problem: Discharge Planning  Goal: Discharge to home or other facility with appropriate resources  Outcome: Progressing  Flowsheets (Taken 12/08/2023 2345)  Discharge to home or other facility with appropriate resources: Identify barriers to discharge with patient and caregiver     Problem: Safety - Adult  Goal: Free from fall injury  Outcome: Progressing     "

## 2023-12-10 ENCOUNTER — Inpatient Hospital Stay: Admit: 2023-12-10 | Payer: MEDICARE | Primary: Internal Medicine

## 2023-12-10 LAB — POCT ACTIVATED CLOTTING TIME: Activated Clotting Time: 182 s — ABNORMAL HIGH (ref 79–138)

## 2023-12-10 LAB — ECHO (TTE) COMPLETE (PRN CONTRAST/BUBBLE/STRAIN/3D)
AV Area by Peak Velocity: 2 cm2
AV Peak Gradient: 7 mmHg
AV Peak Velocity: 1.3 m/s
AV Velocity Ratio: 0.69
AVA/BSA Peak Velocity: 0.9 cm2/m2
Body Surface Area: 2.33 m2
EF Physician: 55 %
Fractional Shortening 2D: 30 % (ref 28–44)
IVSd: 1 cm (ref 0.6–1.0)
LV Mass 2D Index: 67.2 g/m2 (ref 49–115)
LV Mass 2D: 152.6 g (ref 88–224)
LV RWT Ratio: 0.51
LVIDd Index: 1.89 cm/m2
LVIDd: 4.3 cm (ref 4.2–5.9)
LVIDs Index: 1.32 cm/m2
LVIDs: 3 cm
LVOT Area: 2.8 cm2
LVOT Diameter: 1.9 cm
LVOT Peak Gradient: 3 mmHg
LVOT Peak Velocity: 0.9 m/s
LVPWd: 1.1 cm — AB (ref 0.6–1.0)
MV E Velocity: 0.57 m/s

## 2023-12-10 LAB — CBC
Hematocrit: 40.8 % (ref 36.6–50.3)
Hemoglobin: 14.3 g/dL (ref 12.1–17.0)
MCH: 30.5 pg (ref 26.0–34.0)
MCHC: 35 g/dL (ref 30.0–36.5)
MCV: 87 FL (ref 80.0–99.0)
MPV: 10 FL (ref 8.9–12.9)
Nucleated RBCs: 0 /100{WBCs}
Platelets: 200 K/uL (ref 150–400)
RBC: 4.69 M/uL (ref 4.10–5.70)
RDW: 13.7 % (ref 11.5–14.5)
WBC: 8.1 K/uL (ref 4.1–11.1)
nRBC: 0 K/uL (ref 0.00–0.01)

## 2023-12-10 LAB — APTT: APTT: 26.6 s (ref 22.1–31.0)

## 2023-12-10 MED ORDER — SULFUR HEXAFLUORIDE MICROSPH 60.7-25 MG IJ SUSR
60.7-25 | INTRAMUSCULAR | Status: AC
Start: 2023-12-10 — End: 2023-12-10

## 2023-12-10 MED ORDER — ASPIRIN 81 MG PO TBEC
81 | ORAL_TABLET | Freq: Every day | ORAL | 3 refills | 30.00000 days | Status: AC
Start: 2023-12-10 — End: ?

## 2023-12-10 MED ORDER — SULFUR HEXAFLUORIDE MICROSPH 60.7-25 MG IJ SUSR
60.7-25 | Freq: Once | INTRAMUSCULAR | Status: AC | PRN
Start: 2023-12-10 — End: 2023-12-10
  Administered 2023-12-10: 14:00:00 2 mL via INTRAVENOUS

## 2023-12-10 MED ORDER — TICAGRELOR 90 MG PO TABS
90 | ORAL_TABLET | Freq: Two times a day (BID) | ORAL | 11 refills | 30.00000 days | Status: DC
Start: 2023-12-10 — End: 2023-12-27

## 2023-12-10 MED ORDER — METOPROLOL SUCCINATE ER 50 MG PO TB24
50 | ORAL_TABLET | Freq: Every day | ORAL | 3 refills | 90.00000 days | Status: AC
Start: 2023-12-10 — End: ?

## 2023-12-10 MED ORDER — PRAVASTATIN SODIUM 10 MG PO TABS
10 | Freq: Every evening | ORAL | Status: DC
Start: 2023-12-10 — End: 2023-12-10

## 2023-12-10 MED ORDER — METOPROLOL SUCCINATE ER 50 MG PO TB24
50 | Freq: Every day | ORAL | Status: DC
Start: 2023-12-10 — End: 2023-12-10
  Administered 2023-12-10: 12:00:00 50 mg via ORAL

## 2023-12-10 MED ORDER — DILTIAZEM HCL ER COATED BEADS 180 MG PO CP24
180 | ORAL_CAPSULE | Freq: Every day | ORAL | 3 refills | 65.00000 days | Status: AC
Start: 2023-12-10 — End: ?

## 2023-12-10 MED FILL — BRILINTA 90 MG PO TABS: 90 mg | ORAL | Qty: 1 | Fill #0

## 2023-12-10 MED FILL — FAMOTIDINE 20 MG PO TABS: 20 mg | ORAL | Qty: 1

## 2023-12-10 MED FILL — ASPIRIN 81 MG PO TBEC: 81 mg | ORAL | Qty: 1 | Fill #0

## 2023-12-10 MED FILL — FAMOTIDINE 20 MG PO TABS: 20 mg | ORAL | Qty: 1 | Fill #0

## 2023-12-10 MED FILL — METOPROLOL SUCCINATE ER 50 MG PO TB24: 50 mg | ORAL | Qty: 1 | Fill #0

## 2023-12-10 MED FILL — LUMASON 60.7-25 MG IJ SUSR: 60.7-25 mg | INTRAMUSCULAR | Qty: 5 | Fill #0

## 2023-12-10 MED FILL — TAMSULOSIN HCL 0.4 MG PO CAPS: 0.4 mg | ORAL | Qty: 1 | Fill #0

## 2023-12-10 MED FILL — PANTOPRAZOLE SODIUM 40 MG PO TBEC: 40 mg | ORAL | Qty: 1 | Fill #0

## 2023-12-10 MED FILL — FLUOXETINE HCL 10 MG PO CAPS: 10 mg | ORAL | Qty: 1 | Fill #0

## 2023-12-10 MED FILL — PRAVASTATIN SODIUM 10 MG PO TABS: 10 mg | ORAL | Qty: 1 | Fill #0

## 2023-12-10 MED FILL — VALSARTAN 160 MG PO TABS: 160 mg | ORAL | Qty: 2 | Fill #0

## 2023-12-10 MED FILL — DILTIAZEM HCL ER COATED BEADS 180 MG PO CP24: 180 mg | ORAL | Qty: 1 | Fill #0

## 2023-12-10 MED FILL — LUMASON 60.7-25 MG IJ SUSR: 60.7-25 mg | INTRAMUSCULAR | Qty: 2 | Fill #0

## 2023-12-10 MED FILL — CETIRIZINE HCL 10 MG PO TABS: 10 mg | ORAL | Qty: 1 | Fill #0

## 2023-12-10 NOTE — Progress Notes (Addendum)
 "0730: Bedside shift change report given to Rocky, CHARITY FUNDRAISER (oncoming nurse) by Prentice, RN (offgoing nurse). Report included the following information Nurse Handoff Report, MAR, and Recent Results.     0930: Echo at bedside.    1200: AVS reviewed with patient and spouse. New medications and medication changes listed below reviewed. Follow-up appointments as indicated in the AVS reviewed. Post-cath care reviewed, stent card with patient at discharge. Patient and spouse verbalized understanding, and all questions were answered.        Medication List        START taking these medications      aspirin  81 MG EC tablet  Take 1 tablet by mouth daily  Start taking on: December 11, 2023  Notes to patient: Next dose tomorrow, 10/11     metoprolol  succinate 50 MG extended release tablet  Commonly known as: TOPROL  XL  Take 1 tablet by mouth daily  Start taking on: December 11, 2023  Notes to patient: Next dose tomorrow, 10/11     ticagrelor  90 MG Tabs tablet  Commonly known as: BRILINTA   Take 1 tablet by mouth 2 times daily  Notes to patient: Next dose tonight, 10/10 at bedtime            CHANGE how you take these medications      dilTIAZem  180 MG extended release capsule  Commonly known as: CARDIZEM  CD  Take 1 capsule by mouth daily  Start taking on: December 11, 2023  What changed:   medication strength  how much to take  Notes to patient: Next dose tomorrow, 10/11            CONTINUE taking these medications      famotidine  20 MG tablet  Commonly known as: PEPCID   TAKE 1 TABLET BY MOUTH 2 TIMES A DAY  Notes to patient: Next dose tonight, 10/10, at bedtime     FLUoxetine  10 MG capsule  Commonly known as: PROZAC   TAKE 1 CAPSULE BY MOUTH DAILY  Notes to patient: Next dose tomorrow, 10/11     furosemide  20 MG tablet  Commonly known as: LASIX   TAKE 1 TABLET BY MOUTH EVERY OTHER DAY IN THE MORNING AS NEEDED FOR LEG SWELLING     METAMUCIL FIBER PO  Notes to patient: Next dose tomorrow, 10/11     omeprazole 20 MG delayed release  capsule  Commonly known as: PRILOSEC  Notes to patient: Next dose tomorrow, 10/11     pravastatin  10 MG tablet  Commonly known as: PRAVACHOL   One pill each Monday for the first 2 weeks. After that , one pill twice weekly on Monday and Thursday     tamsulosin  0.4 MG capsule  Commonly known as: FLOMAX   Notes to patient: Next dose tomorrow, 10/11     valsartan  320 MG tablet  Commonly known as: DIOVAN   Take 1 tablet by mouth daily In place of Lisinopril   Notes to patient: Next dose tomorrow, 10/11            STOP taking these medications      DICLOFENAC PO     dilTIAZem  300 MG extended release capsule  Commonly known as: TIAZAC                Where to Get Your Medications        These medications were sent to [facility] [id number] [place], [place] - [address] - P [phone] [address] [facility] [phone]  [address], [place] [address]  Phone: [phone]   aspirin  81 MG EC tablet  dilTIAZem  180 MG extended release capsule  metoprolol  succinate 50 MG extended release tablet  ticagrelor  90 MG Tabs tablet         "

## 2023-12-10 NOTE — Progress Notes (Signed)
 "    [facility] Cardiology Progress Note    Date of service: 12/10/2023    Subjective:   Mr Forero did well overnight, no chest pain.    Objective:    Vitals:    12/10/23 0553   BP:    Pulse: 68   Resp:    Temp:    SpO2:        Physical Exam  Vitals reviewed.   Constitutional:       General: He is not in acute distress.  HENT:      Head: Normocephalic.   Eyes:      Conjunctiva/sclera: Conjunctivae normal.   Cardiovascular:      Rate and Rhythm: Normal rate and regular rhythm.      Heart sounds: Normal heart sounds. No murmur heard.  Pulmonary:      Effort: Pulmonary effort is normal. No respiratory distress.      Breath sounds: Normal breath sounds.   Abdominal:      General: Abdomen is flat.      Palpations: Abdomen is soft.   Musculoskeletal:         General: No swelling or deformity.   Skin:     General: Skin is warm and dry.   Neurological:      General: No focal deficit present.      Mental Status: He is alert. Mental status is at baseline.         Data reviewed:  Recent Results (from the past 12 hours)   APTT    Collection Time: 12/10/23  2:51 AM   Result Value Ref Range    APTT 26.6 22.1 - 31.0 sec    Therapeutic Range   58.0 - 77.0 SECS   CBC    Collection Time: 12/10/23  2:51 AM   Result Value Ref Range    WBC 8.1 4.1 - 11.1 K/uL    RBC 4.69 4.10 - 5.70 M/uL    Hemoglobin 14.3 12.1 - 17.0 g/dL    Hematocrit 59.1 63.3 - 50.3 %    MCV 87.0 80.0 - 99.0 FL    MCH 30.5 26.0 - 34.0 PG    MCHC 35.0 30.0 - 36.5 g/dL    RDW 86.2 88.4 - 85.4 %    Platelets 200 150 - 400 K/uL    MPV 10.0 8.9 - 12.9 FL    Nucleated RBCs 0.0 0 PER 100 WBC    nRBC 0.00 0.00 - 0.01 K/uL     Lab Results   Component Value Date    CHOL 215 (H) 12/09/2023    TRIG 256 (H) 12/09/2023    HDL 35 (L) 12/09/2023    LDL 129 (H) 12/09/2023    VLDL 51 12/09/2023    CHOLHDLRATIO 6.2 (H) 12/09/2023         Assessment and Plan:    NSTEMI  S/p PCI of the RCA  DAPT x 12 months:   Will need Brilinta  90mg  bid x 30 days supply, 11 refills prescribed at  discharge  Aspirin  81mg  daily  Beta blocker, Statin  Cardiac rehab recommended    CAD presenting with unstable angina  Critical stenosis of the distal RCA as the culprit lesion  Moderate disease in the LAD and Lcx    Hyperlipidemia  LDL 129 on Pravastatin   Intolerance to atorvastatin  and rosuvastatin  (arthralgia)  Will aim to get him on Repatha, starting as soon as possible as outpatient  May benefit from Vascepa as well given TG > 150  Hypertensive heart disease  BP above goal  Increase metoprolol  to 50mg  daily  Continue Valsartan , Diltiazem  at 180mg  daily    Stable for discharge home today  To follow-up with me in 1-2 weeks.      Signed:  Keyonta Barradas L. Moises, MD  Interventional Cardiology  12/10/2023  "

## 2023-12-10 NOTE — Progress Notes (Signed)
 "        [facility]                                                                                          Hospitalist Progress Note  Savannah Divine, MD  Office Phone: [phone]        Date of Service:  12/10/2023  NAME:  Jeffrey Soto  DOB:  [birth year]  MRN:  [id number]       Admission Summary:   COLYN MIRON is a 78 y.o. male with past medical history of PAD, hypertension, history of prostate cancer, prostate disorder, GERD who presented to ED with complaints of chest pain.  Had been in his usual state of health until about 3 PM yesterday when shortly after playing golf, he developed mid-chest/substernal, pressure and aching pain with no alleviating or worsening factors.        Interval history / Subjective:   Follow up NSTEMI  S/p Cardiac cath 10/9  Feeling better   No new issues  Assessment & Plan:     NSTE-ACS  -trop leak to 706  -s/p cardiac cath 10/9 with placement of 3 stents  -ASA/Brilinta /Metoprolol   -Appreciate Cardiology, ok to discharge, outpatient Repatha     Hypertension   - PTA valsartan  and diltiazem      History of PAT  - Continue PTA diltiazem      BPH  - PTA Flomax      Mood disorder  - PTA Prozac      GERD  - Protonix  and Pepcid         Code status: FULL CODE  Prophylaxis: scd    Plan: discharge to home today  Care Plan discussed with: patient, RN, CM  Anticipated Disposition: home today  Inpatient  Cardiac monitoring: Telemetry  Central Line:            Social Drivers of Health     Tobacco Use: Medium Risk (07/09/2023)    Received from [facility]    Patient History     Smoking Tobacco Use: Former     Smokeless Tobacco Use: Former     Passive Exposure: Not on file   Alcohol Use: Not At Risk (11/23/2022)    AUDIT-C     Frequency of Alcohol Consumption: Monthly or less     Average Number of Drinks: 1 or 2     Frequency of Binge Drinking: Never   Physicist, Medical Strain: Not on file   Food Insecurity: No Food Insecurity (12/08/2023)    Hunger Vital Sign     Worried  About Running Out of Food in the Last Year: Never true     Ran Out of Food in the Last Year: Never true   Transportation Needs: No Transportation Needs (12/08/2023)    PRAPARE - Therapist, Art (Medical): No     Lack of Transportation (Non-Medical): No   Physical Activity: Sufficiently Active (11/23/2022)    Exercise Vital Sign     Days of Exercise per Week: 7 days     Minutes of Exercise per Session: 40 min  Stress: Not on file   Social Connections: Not on file   Intimate Partner Violence: Not on file   Depression: Not at risk (06/28/2023)    PHQ-2     PHQ-2 Score: 0   Housing Stability: Low Risk  (12/08/2023)    Housing Stability Vital Sign     Unable to Pay for Housing in the Last Year: No     Number of Times Moved in the Last Year: 0     Homeless in the Last Year: No   Interpersonal Safety: Not At Risk (12/08/2023)    Interpersonal Safety Domain Source: IP Abuse Screening     Physical abuse: Denies     Verbal abuse: Denies     Emotional abuse: Denies     Financial abuse: Denies     Sexual abuse: Denies   Utilities: Not At Risk (12/08/2023)    AHC Utilities     Threatened with loss of utilities: No       Review of Systems:   Pertinent items are noted in HPI.       Vital Signs:    Last 24hrs VS reviewed since prior progress note. Most recent are:  Vitals:    12/10/23 0817   BP: (!) 141/83   Pulse: 75   Resp: 20   Temp: 97.7 F (36.5 C)   SpO2: 99%         Intake/Output Summary (Last 24 hours) at 12/10/2023 0859  Last data filed at 12/10/2023 0300  Gross per 24 hour   Intake 940 ml   Output 680 ml   Net 260 ml        Physical Examination:     I had a face to face encounter with this patient and independently examined them on 12/10/2023 as outlined below:          General : alert x 3, awake, no acute distress,   HEENT: PEERL, EOMI, moist mucus membrane, TM clear  Neck: supple, no JVD, no meningeal signs  Chest: Clear to auscultation bilaterally   CVS: S1 S2 heard, Capillary refill less than 2  seconds  Abd: soft/ non tender, non distended, BS physiological,   Ext: no clubbing, no cyanosis, no edema, brisk 2+ DP pulses  Neuro/Psych: pleasant mood and affect, CN 2-12 grossly intact, sensory grossly within normal limit, Strength 5/5 in all extremities, DTR 1+ x 4  Skin: warm     Data Review:    Review and/or order of clinical lab test      I have personally and independently reviewed all pertinent labs, diagnostic studies, imaging, and have provided independent interpretation of the same.     Labs:     Recent Labs     12/08/23  1736 12/10/23  0251   WBC 9.0 8.1   HGB 14.2 14.3   HCT 41.7 40.8   PLT 193 200     Recent Labs     12/08/23  1736   NA 140   K 3.7   CL 102   CO2 27   BUN 20   MG 2.0     Recent Labs     12/08/23  1736   ALT 47   GLOB 3.0     Recent Labs     12/10/23  0251   APTT 26.6      No results for input(s): TIBC in the last 72 hours.    Invalid input(s): FE, PSAT, FERR   No results found for: RBCF  No results for input(s): PH, PCO2, PO2 in the last 72 hours.  No results for input(s): CPK in the last 72 hours.    Invalid input(s): CPKMB, CKNDX, TROIQ  Lab Results   Component Value Date/Time    CHOL 215 12/09/2023 02:43 AM    HDL 35 12/09/2023 02:43 AM    LDL 129 12/09/2023 02:43 AM    LDL 105 05/30/2021 03:03 PM     No results found for: GLUCPOC  @LABUA @    Notes reviewed from all clinical/nonclinical/nursing services involved in patient's clinical care. Care coordination discussions were held with appropriate clinical/nonclinical/ nursing providers based on care coordination needs.         Patients current active Medications were reviewed, considered, added and adjusted based on the clinical condition today.      Home Medications were reconciled to the best of my ability given all available resources at the time of admission. Route is PO if not otherwise noted.      Admission Status:[id number]:::1}      Medications Reviewed:     Current Facility-Administered  Medications   Medication Dose Route Frequency    metoprolol  succinate (TOPROL  XL) extended release tablet 50 mg  50 mg Oral Daily    pravastatin  (PRAVACHOL ) tablet 10 mg  10 mg Oral Nightly    famotidine  (PEPCID ) tablet 20 mg  20 mg Oral BID    FLUoxetine  (PROZAC ) capsule 10 mg  10 mg Oral Daily    pantoprazole  (PROTONIX ) tablet 40 mg  40 mg Oral QAM AC    tamsulosin  (FLOMAX ) capsule 0.4 mg  0.4 mg Oral Daily    valsartan  (DIOVAN ) tablet 320 mg  320 mg Oral Daily    sodium chloride  flush 0.9 % injection 5-40 mL  5-40 mL IntraVENous 2 times per day    sodium chloride  flush 0.9 % injection 5-40 mL  5-40 mL IntraVENous PRN    potassium chloride  (KLOR-CON ) extended release tablet 40 mEq  40 mEq Oral PRN    Or    potassium bicarb-citric acid  (EFFER-K) effervescent tablet 40 mEq  40 mEq Oral PRN    Or    potassium chloride  10 mEq/100 mL IVPB (Peripheral Line)  10 mEq IntraVENous PRN    magnesium  sulfate 2000 mg in 50 mL IVPB premix  2,000 mg IntraVENous PRN    ondansetron  (ZOFRAN -ODT) disintegrating tablet 4 mg  4 mg Oral Q8H PRN    Or    ondansetron  (ZOFRAN ) injection 4 mg  4 mg IntraVENous Q6H PRN    polyethylene glycol (GLYCOLAX ) packet 17 g  17 g Oral Daily PRN    acetaminophen  (TYLENOL ) suppository 650 mg  650 mg Rectal Q6H PRN    dilTIAZem  (CARDIZEM  CD) extended release capsule 180 mg  180 mg Oral Daily    aspirin  EC tablet 81 mg  81 mg Oral Daily    sodium chloride  flush 0.9 % injection 5-40 mL  5-40 mL IntraVENous 2 times per day    sodium chloride  flush 0.9 % injection 5-40 mL  5-40 mL IntraVENous PRN    sodium chloride  flush 0.9 % injection 5-40 mL  5-40 mL IntraVENous 2 times per day    sodium chloride  flush 0.9 % injection 5-40 mL  5-40 mL IntraVENous PRN    0.9 % sodium chloride  infusion   IntraVENous PRN    acetaminophen  (TYLENOL ) tablet 650 mg  650 mg Oral Q4H PRN    ticagrelor  (BRILINTA ) tablet 90 mg  90 mg Oral BID  cetirizine  (ZYRTEC ) tablet 5 mg  5 mg Oral Daily    nitroglycerin  (NITRO-BID ) 2 %  ointment 0.5 inch  0.5 inch Topical 4 times per day     ______________________________________________________________________  EXPECTED LENGTH OF STAY: 2  ACTUAL LENGTH OF STAY:          1                 Savannah Divine, MD    "

## 2023-12-10 NOTE — Discharge Instructions (Addendum)
"     Discharge Instructions       PATIENT ID: Jeffrey Soto  MRN: [id number]   DATE OF BIRTH: [birth year]    DATE OF ADMISSION: @ADMITDTTM @    DATE OF DISCHARGE: 12/10/2023    PRIMARY CARE PROVIDER: @PCP @     ATTENDING PHYSICIAN: @ATTPROV @  DISCHARGING PROVIDER: Savannah Divine, MD    To contact this individual call [phone] and ask the operator to page.   If unavailable ask to be transferred the Adult Hospitalist Department.    DISCHARGE DIAGNOSES NSTEMI    CONSULTATIONS: cardiology    PROCEDURES/SURGERIES: Procedure(s):  Coronary angiography  Instantaneous wave-free ratio (iFR)    PENDING TEST RESULTS:   At the time of discharge the following test results are still pending: none    FOLLOW UP APPOINTMENTS:   PCP  Cardiology    ADDITIONAL CARE RECOMMENDATIONS: as above    DIET: cardiac diet    ACTIVITY: activity as tolerated      DISCHARGE MEDICATIONS:   See Medication Reconciliation Form    It is important that you take the medication exactly as they are prescribed.   Keep your medication in the bottles provided by the pharmacist and keep a list of the medication names, dosages, and times to be taken in your wallet.   Do not take other medications without consulting your doctor.       NOTIFY YOUR PHYSICIAN FOR ANY OF THE FOLLOWING:   Fever over 101 degrees for 24 hours.   Chest pain, shortness of breath, fever, chills, nausea, vomiting, diarrhea, change in mentation, falling, weakness, bleeding. Severe pain or pain not relieved by medications.  Or, any other signs or symptoms that you may have questions about.      DISPOSITION:   x Home With:   OT  PT  HH  RN       SNF/Inpatient Rehab/LTAC    Independent/assisted living    Hospice    Other:     CDMP Checked:   Yes x     PROBLEM LIST Updated:  Yes x       Signed:   Savannah Divine, MD  12/10/2023  11:47 AM   "

## 2023-12-10 NOTE — Discharge Summary (Addendum)
 "    Discharge Summary       PATIENT ID: Jeffrey Soto  MRN: [id number]   DATE OF BIRTH: 12/04/1945    DATE OF ADMISSION: 12/08/2023  5:21 PM    DATE OF DISCHARGE: 12/10/2023   PRIMARY CARE PROVIDER: [name], MD     ATTENDING PHYSICIAN: Dr Savannah Divine  DISCHARGING PROVIDER: Savannah Divine, MD    To contact this individual call [phone] and ask the operator to page.  If unavailable ask to be transferred the Adult Hospitalist Department.    CONSULTATIONS: IP CONSULT TO CARDIOLOGY  IP CONSULT TO CARDIAC REHAB  IP CONSULT TO CARDIAC REHAB  IP CONSULT TO CARDIAC REHAB  IP CONSULT TO CARDIAC REHAB    PROCEDURES/SURGERIES: Procedure(s):  Coronary angiography  Instantaneous wave-free ratio (iFR)    ADMITTING DIAGNOSES & HOSPITAL COURSE:   NSTE-ACS  -trop leak to 706  -s/p cardiac cath 10/9 with placement of 3 stents  -ASA/Brilinta /Metoprolol   -Appreciate Cardiology, ok to discharge, outpatient Repatha     Hypertension   - PTA valsartan  and diltiazem      History of PAT  - Continue PTA diltiazem      BPH  - PTA Flomax      Mood disorder  - PTA Prozac      GERD  - Protonix  and Pepcid       PENDING TEST RESULTS:   At the time of discharge the following test results are still pending: none    FOLLOW UP APPOINTMENTS:    PCP  Cardiology    ADDITIONAL CARE RECOMMENDATIONS: as  above    DIET: cardiac diet    ACTIVITY: activity as tolerated      DISCHARGE MEDICATIONS:     Medication List        START taking these medications      aspirin  81 MG EC tablet  Take 1 tablet by mouth daily  Start taking on: December 11, 2023     metoprolol  succinate 50 MG extended release tablet  Commonly known as: TOPROL  XL  Take 1 tablet by mouth daily  Start taking on: December 11, 2023     ticagrelor  90 MG Tabs tablet  Commonly known as: BRILINTA   Take 1 tablet by mouth 2 times daily            CHANGE how you take these medications      dilTIAZem  180 MG extended release capsule  Commonly known as: CARDIZEM  CD  Take 1 capsule by mouth daily  Start  taking on: December 11, 2023  What changed:   medication strength  how much to take            CONTINUE taking these medications      famotidine  20 MG tablet  Commonly known as: PEPCID   TAKE 1 TABLET BY MOUTH 2 TIMES A DAY     FLUoxetine  10 MG capsule  Commonly known as: PROZAC   TAKE 1 CAPSULE BY MOUTH DAILY     furosemide  20 MG tablet  Commonly known as: LASIX   TAKE 1 TABLET BY MOUTH EVERY OTHER DAY IN THE MORNING AS NEEDED FOR LEG SWELLING     METAMUCIL FIBER PO     omeprazole 20 MG delayed release capsule  Commonly known as: PRILOSEC     pravastatin  10 MG tablet  Commonly known as: PRAVACHOL   One pill each Monday for the first 2 weeks. After that , one pill twice weekly on Monday and Thursday     tamsulosin  0.4 MG  capsule  Commonly known as: FLOMAX      valsartan  320 MG tablet  Commonly known as: DIOVAN   Take 1 tablet by mouth daily In place of Lisinopril             STOP taking these medications      DICLOFENAC PO     dilTIAZem  300 MG extended release capsule  Commonly known as: TIAZAC                Where to Get Your Medications        These medications were sent to [facility] [id number] [facility], [place] - [address] - P [phone] [address] [facility] [id number]  [address], [place] [address]      Phone: [phone]   aspirin  81 MG EC tablet  dilTIAZem  180 MG extended release capsule  metoprolol  succinate 50 MG extended release tablet  ticagrelor  90 MG Tabs tablet           NOTIFY YOUR PHYSICIAN FOR ANY OF THE FOLLOWING:   Fever over 101 degrees for 24 hours.   Chest pain, shortness of breath, fever, chills, nausea, vomiting, diarrhea, change in mentation, falling, weakness, bleeding. Severe pain or pain not relieved by medications.  Or, any other signs or symptoms that you may have questions about.    DISPOSITION:   x Home With:   OT  PT  HH  RN       Long term SNF/Inpatient Rehab    Independent/assisted living    Hospice    Other:       PATIENT CONDITION AT DISCHARGE:     Functional status    Poor      Deconditioned    x Independent      Cognition    x Lucid     Forgetful     Dementia      Catheters/lines (plus indication)    Foley     PICC     PEG    x None      Code status    x Full code     DNR      PHYSICAL EXAMINATION AT DISCHARGE:    General : alert x 3, awake, no acute distress,   HEENT: PEERL, EOMI, moist mucus membrane, TM clear  Neck: supple, no JVD, no meningeal signs  Chest: Clear to auscultation bilaterally   CVS: S1 S2 heard, Capillary refill less than 2 seconds  Abd: soft/ Non tender, non distended, BS physiological,   Ext: no clubbing, no cyanosis, no edema, brisk 2+ DP pulses  Neuro/Psych: non focal  Skin: warm     CHRONIC MEDICAL DIAGNOSES:      Greater than 37 minutes were spent with the patient on counseling and coordination of care    Signed:   Savannah Divine, MD  12/10/2023  11:47 AM     "

## 2023-12-10 NOTE — Care Coordination (Signed)
"  Care Management Initial Assessment       RUR: 8%  Readmission? No  1st IM letter given? Yes - 10/10  1st [employer] letter given: No  Admission: Elevated troponin [R79.8* Elevated troponin, NSTEMI (non-ST elevated myocardial infarction) (HCC), Chest pain, unspecified type     CM met w patient at bedside in [facility] to introduce self/role as CM and to confirm the Facesheet.  Patient lives w his wife in a 2 level home w a 1st floor set up and 3 steps to enter w left handrail.  He is ind w all ADLs, owns a SPC from his dad and drives.  He denies hx of HH/SNF/IPR and denies concerns w d/c and reports his wife is on the way to transport him home.  All questions have been answered and CM will follow for TOC plan.    Preferred pharmacy is [facility] on [address] and confirmed in EMR.      12/10/23 0853   Service Assessment   Information Provided By Patient   Patient Orientation Alert;Oriented;Person;Place;Situation   Cognition No Apparent Deficit   Primary Caregiver Self   Support Systems Spouse/Significant Other   Patient's Healthcare Decision Maker is: Named in Scanned ACP Document  (Discussed w PCP but don't see documents)   PCP Verified by CM Yes   Last Visit to PCP Within last 6 months   Prior Functional Level Independent in ADLs/IADLs   Can patient return to prior living arrangement Yes   Ability to make needs known: Good   Family able to assist with home care needs: Yes   Other than yourself, who should be included in your transition plan for discharge from the hospital? Wife, Donovin Kraemer [phone]   Social/Functional History   Prior Level of Assist for ADLs Independent   Prior Level of Assist for [employer] Independent   Prior Level of Assist for Transfers Independent   Active Driver Yes   Occupation Retired   Surveyor, [employer] Prior to Acute Admission House   Lives With Spouse   Current Services Prior To Admission None   Patient expects to be discharged to: Home   Home Layout Two level;Bed/Bath upstairs    Home Access Stairs to enter with rails   Entrance Stairs - Number of Steps 3   Entrance Stairs - Rails Left   Services At/After Discharge   Who will provide transportation at discharge? Family   Condition of Participation: Discharge Planning   The Plan for Transition of Care is related to the following treatment goals: Discharge planning   The Patient and/or Patient Representative was provided with a Choice of Provider? Patient   The Patient and/Or Patient Representative agree with the Discharge Plan? Yes   Freedom of Choice list was provided with basic dialogue that supports the patient's individualized plan of care/goals, treatment preferences, and shares the quality data associated with the providers?  Yes     Andriette Deis, MSW CCM  Care Management   Available on [employer] or [phone]   "

## 2023-12-14 LAB — POCT ACTIVATED CLOTTING TIME
Activated Clotting Time: 285 s — ABNORMAL HIGH (ref 79–138)
Activated Clotting Time: 308 s — ABNORMAL HIGH (ref 79–138)
Activated Clotting Time: 285 s — ABNORMAL HIGH (ref 79–138)
Activated Clotting Time: 844 s — ABNORMAL HIGH (ref 79–138)

## 2023-12-14 MED ORDER — FENTANYL CITRATE PF 50 MCG/ML IJ SOSY
50 | INTRAMUSCULAR | Status: DC | PRN
Start: 2023-12-14 — End: 2023-12-14
  Administered 2023-12-09: 18:00:00 50 via INTRAVENOUS

## 2023-12-21 ENCOUNTER — Inpatient Hospital Stay: Admit: 2023-12-21 | Payer: MEDICARE | Attending: Cardiovascular Disease | Primary: Internal Medicine

## 2023-12-21 ENCOUNTER — Ambulatory Visit: Admit: 2023-12-21 | Payer: MEDICARE | Attending: Internal Medicine | Primary: Internal Medicine

## 2023-12-21 VITALS — HR 60 | Resp 18 | Ht 73.0 in | Wt 226.0 lb

## 2023-12-21 VITALS — BP 138/64 | HR 60 | Temp 97.60000°F | Ht 74.0 in | Wt 226.0 lb

## 2023-12-21 DIAGNOSIS — I214 Non-ST elevation (NSTEMI) myocardial infarction: Principal | ICD-10-CM

## 2023-12-21 DIAGNOSIS — M25512 Pain in left shoulder: Principal | ICD-10-CM

## 2023-12-21 MED ORDER — SIMVASTATIN 5 MG PO TABS
5 | ORAL_TABLET | ORAL | 11 refills | Status: AC
Start: 2023-12-21 — End: ?

## 2023-12-21 MED ORDER — VALSARTAN 80 MG PO TABS
80 | ORAL_TABLET | Freq: Every day | ORAL | 11 refills | Status: AC
Start: 2023-12-21 — End: ?

## 2023-12-21 NOTE — Cardio/Pulmonary (Signed)
"  See In-Basket sent below:    Dr. Appleton/[name],     Jeffrey Soto Dawn came in for his first intake appointment with us  at Cardiac Rehab. He is a post NSTEMI/Stent x 3 to RCA on 12/09/23. We did a medication reconciliation and wanted to notify you of some things he mentioned today. He explained he has not been on Brilinta  since discharge d/t high cost. He also hasn't been taking Valsartan  or Pravastatin . He has been compliant with Aspirin , Toprol -XL, Prozac  and Flomax . He seemed to be a little confused about what he is supposed to be taking versus not taking. BP was stable today at 132/86 pre exercise and 144/82 post exercise. HR was 60s-70s in NSR. Thanks!     [name], RN, BSN   "

## 2023-12-21 NOTE — Cardio/Pulmonary (Signed)
 "          INTAKE APPOINTMENT NOTE  12/21/2023    NAME: Jeffrey Soto DOB: 02-21-1946 AGE: 78 y.o.  GENDER: male    CARDIAC REHAB ADMITTING DIAGNOSIS: Acute myocardial infarction, subendocardial infarction, initial episode of care [facility]) [I21.4]  Stented coronary artery [Z95.5]    REFERRING PHYSICIAN: Moises Irl CROME, MD    MEDICAL HX:  Past Medical History:   Diagnosis Date    Arrhythmia     Hx PAT. dr. Chanda evaluated.     Bursitis of both shoulders     CAD (coronary artery disease) 12/08/2023    3 stents to distal RCA    Depression     Elevated lipids 12/16/2009    Fatty liver     Gallbladder polyp     GERD (gastroesophageal reflux disease) 06/26/2003    EGD: duodenitis , dilation of Esophageal ring , DR. [name]    HTN (hypertension) 12/16/2009    Prostate cancer (HCC) 06/02/2017    GLeason 3+ 3= 6 . 2019 . Dr. Bennet Mango     Past Surgical History:   Procedure Laterality Date    CARDIAC PROCEDURE N/A 12/09/2023    Coronary angiography performed by Moises Irl CROME, MD at [facility] CARDIAC CATH LAB    CARDIAC PROCEDURE N/A 12/09/2023    Instantaneous wave-free ratio (iFR) performed by Moises Irl CROME, MD at [facility] CARDIAC CATH LAB    CARDIAC PROCEDURE N/A 12/09/2023    Percutaneous coronary intervention performed by Moises Irl CROME, MD at [facility] CARDIAC CATH LAB    CATARACT EXTRACTION Bilateral     COLONOSCOPY  06/26/03    diverticulosis, polypectomy, f/u dr. Alray     COLONOSCOPY N/A 03/25/2022    COLONOSCOPY DIAGNOSTIC performed by Danial Bias, MD at [facility] ENDOSCOPY    GI      right inguinal hernia repair x3    OTHER SURGICAL HISTORY      removal pilonidal cyst    UROLOGICAL SURGERY  05/25/2017    Prostate biopsy by [name] MD    UROLOGICAL SURGERY      right orchiectomy for benign growth        LABS:     No results found for: HBA1C, HBA1CPOC  Lab Results   Component Value Date/Time    CHOL 215 12/09/2023 02:43 AM    HDL 35 12/09/2023 02:43 AM    LDL 129 12/09/2023 02:43 AM    LDL 105 05/30/2021 03:03 PM     VLDL 51 12/09/2023 02:43 AM    VLDL 36 05/30/2021 03:03 PM         ANTHROPOMETRICS:      Ht Readings from Last 1 Encounters:   12/21/23 1.854 m (6' 1)      Wt Readings from Last 1 Encounters:   12/21/23 102.5 kg (226 lb)        WAIST: 48.5 inches       VISIT SUMMARY:    Jeffrey Soto Dawn 78 y.o. presented to Cardiac Rehab for program orientation and 6 minute walk test today with a primary diagnosis of Acute myocardial infarction, subendocardial infarction, initial episode of care [facility]) [I21.4]  Stented coronary artery [Z95.5] (NSTEMI/Stent x 3 to RCA). His EF is 60 %. Cardiac risk factors include family history, dyslipidemia, hypertension, stress.  Jeffrey Soto is married and is currently retired. Patient was evaluated for depression using the PHQ-9 assessment tool with a result of 3 which is considered mild. The result was discussed with  patient.   Patient denied chest pain or SOB during 6 minute walk test and was in SB/SR BBB inverted T wave. Patient stepped on the Biostep with a peak Watts of 18, average RPM of 40, and 491 steps for a final MET level of 1.7.   Exercise prescription developed using exercise tolerance results and patient stated goals, to be supplemented with home exercise recommendations. Education manual given to patient and reviewed. Patient will attend cardiac rehab 2-3 times/week which will include both exercise and education sessions.    Intake Start Time: 1415  Intake End Time: 1615    Jeffrey Fomby P Eliannah Hinde, RN, BSN      "

## 2023-12-21 NOTE — Progress Notes (Signed)
 "Jeffrey Soto is a 78 y.o. wm CC: MI f/u     SUBJECTIVE:    S/p NSTEMI  with 3 stents to RCA  .  He denies cp, sob, dizziness, palpitations . He is off Pravastatin  (arthralgia) and not taking Brilinta (expense)  Past Medical History:   Diagnosis Date    Arrhythmia     Hx PAT. dr. Chanda evaluated.     Bursitis of both shoulders     CAD (coronary artery disease) 12/08/2023    3 stents to distal RCA    Depression     Elevated lipids 12/16/2009    Fatty liver     Gallbladder polyp     GERD (gastroesophageal reflux disease) 06/26/2003    EGD: duodenitis , dilation of Esophageal ring , DR. [name]    HTN (hypertension) 12/16/2009    Prostate cancer (HCC) 06/02/2017    GLeason 3+ 3= 6 . 2019 . Dr. Bennet Mango     Current Outpatient Medications   Medication Sig Dispense Refill    simvastatin  (ZOCOR ) 5 MG tablet For the first 2 weeks , take one pill each Thursday. Starting with the 3rd week and thereafter , take one pill each Monday and Thursday 8 tablet 11    valsartan  (DIOVAN ) 80 MG tablet Take 1 tablet by mouth daily 30 tablet 11    tamsulosin  (FLOMAX ) 0.4 MG capsule Take 1 capsule by mouth      dilTIAZem  (CARDIZEM  CD) 180 MG extended release capsule Take 1 capsule by mouth daily 30 capsule 3    ticagrelor  (BRILINTA ) 90 MG TABS tablet Take 1 tablet by mouth 2 times daily 60 tablet 11    metoprolol  succinate (TOPROL  XL) 50 MG extended release tablet Take 1 tablet by mouth daily 30 tablet 3    aspirin  81 MG EC tablet Take 1 tablet by mouth daily 30 tablet 3    FLUoxetine  (PROZAC ) 10 MG capsule TAKE 1 CAPSULE BY MOUTH DAILY 90 capsule 3    famotidine  (PEPCID ) 20 MG tablet TAKE 1 TABLET BY MOUTH 2 TIMES A DAY 30 tablet 11    omeprazole (PRILOSEC) 20 MG delayed release capsule Take 1 capsule by mouth daily      METAMUCIL FIBER PO Take by mouth 2 tablespoonsful daily       Current Facility-Administered Medications   Medication Dose Route Frequency Provider Last Rate Last Admin    triamcinolone  acetonide (KENALOG -40) injection 40  mg  40 mg Intra-artICUlar Once         triamcinolone  acetonide (KENALOG -40) injection 40 mg  40 mg Intra-artICUlar Once         triamcinolone  acetonide (KENALOG -40) injection 40 mg  40 mg Intra-artICUlar Once          Allergies   Allergen Reactions    Lipitor [Atorvastatin ]      Arthralgia     Pravastatin       Arthralgia      Rosuvastatin       Arthralgia      Social History     Tobacco Use   Smoking Status Former    Current packs/day: 0.00    Types: Cigarettes    Quit date: 1968    Years since quitting: 57.8   Smokeless Tobacco Never       Review of Systems  Review of Systems -   Postional left periscapular pain for  past 2-3 months   Prior R eye cataract sgy and c/o  seeing a  animator  Physical Examination:  BP 138/64   Pulse 60   Temp 97.6 F (36.4 C)   Ht 1.88 m (6' 2)   Wt 102.5 kg (226 lb)   BMI 29.02 kg/m   Physical Exam    General: Appears in no acute distress    ENT:   EOMI PERRLA counts  fingers well before the eyes   Lung-clear  Heart - RRR [facility] Bond/o mgr  Back nt .   Abdomen -bs+ soft, nt  Extremities - left shoulder from   Left wrist loose stitch removed manually   2+ L radial pulse   Healing cath site , expected mild purpura   No ankle edema     Labs: No results found for this or any previous visit (from the past 8 hours).    No results found for this visit on 12/21/23.     Assessment/Plan   Diagnosis Orders   1. Left shoulder pain, unspecified chronicity        2. Hypercholesterolemia        3. Essential hypertension        4. H/O non-ST elevation myocardial infarction (NSTEMI)          Suspect MSK left shoulder /scapular pain . Advised gentle stretches.   Lipids. Discussed Simvastatin  , ERX sent  HTN ; he is off Valsartan  320 mg . I added Valsartan  80 mg qd .   H/o NSTEMI and stenting. I  advised he get on Brilinta  ASAP. Discussed risk of in stent stenosis. f/u Kem Hensen/ Cardiology scheduled in 2 days  RV here in 6 weeks for OV and fasting lipids. F/u sooner prn       Author:  Ryan Flake,  MD 5:59 PM10/21/2025  "

## 2023-12-21 NOTE — Patient Instructions (Signed)
"  Gentle shoulder exercises   Return for physical in 6 months  "

## 2023-12-21 NOTE — Cardio/Pulmonary (Signed)
"  Post-dated note:  12/21/23 1630    Jeffrey Soto completed their intake appointment for OP Cardiac rehab today.  At the end of his appointment, he shared that he could not afford the medications prescribed to him post-cath earlier this month and did not choose to fill the prescriptions once he saw the price at the pharmacy.  We emphasized the importance of the the medications (Brilinta , Valsartan , and Pravastatin ) especially noting the safety risk posed by not taking his Brilinta  after his stents.  Jeffrey Raisin, RN called Jeffrey Soto office (referring cardiologist) however the office would not page him and insisted they could only take a message.      Jeffrey Soto had been to see his PCP Jeffrey Soto earlier today and said these medications were not discussed.  A staff message was placed to both physicians in EPIC.  After spending 2 hours with this patient and his wife, it was clear they did not understand the importance of the medications or the urgency despite our efforts.  Patient stated he planned to go to a follow up appointment with Jeffrey Soto on 12/24/23. We encouraged him to talk to his physician as soon as possible about getting some medications he can afford.    His next appointment at cardiac rehab is for Monday, 12/27/23.  We explained he would need to take all of his medications as ordered if he plans to participate at cardiac rehab.      Jeffrey DELENA Na, RN    "

## 2023-12-27 ENCOUNTER — Inpatient Hospital Stay: Admit: 2023-12-27 | Payer: MEDICARE | Attending: Cardiovascular Disease | Primary: Internal Medicine

## 2023-12-28 ENCOUNTER — Inpatient Hospital Stay: Admit: 2023-12-28 | Payer: MEDICARE | Attending: Cardiovascular Disease | Primary: Internal Medicine

## 2023-12-31 ENCOUNTER — Inpatient Hospital Stay: Admit: 2023-12-31 | Payer: MEDICARE | Attending: Cardiovascular Disease | Primary: Internal Medicine

## 2024-01-03 ENCOUNTER — Inpatient Hospital Stay: Admit: 2024-01-03 | Payer: MEDICARE | Attending: Cardiovascular Disease | Primary: Internal Medicine

## 2024-01-03 DIAGNOSIS — I214 Non-ST elevation (NSTEMI) myocardial infarction: Principal | ICD-10-CM

## 2024-01-04 ENCOUNTER — Inpatient Hospital Stay: Admit: 2024-01-04 | Payer: MEDICARE | Attending: Cardiovascular Disease | Primary: Internal Medicine

## 2024-01-07 ENCOUNTER — Inpatient Hospital Stay: Admit: 2024-01-07 | Payer: MEDICARE | Attending: Cardiovascular Disease | Primary: Internal Medicine

## 2024-01-10 ENCOUNTER — Encounter: Payer: MEDICARE | Primary: Internal Medicine

## 2024-01-11 ENCOUNTER — Inpatient Hospital Stay: Admit: 2024-01-11 | Payer: MEDICARE | Attending: Cardiovascular Disease | Primary: Internal Medicine

## 2024-01-14 ENCOUNTER — Inpatient Hospital Stay: Admit: 2024-01-14 | Payer: MEDICARE | Attending: Cardiovascular Disease | Primary: Internal Medicine

## 2024-01-17 ENCOUNTER — Inpatient Hospital Stay: Admit: 2024-01-17 | Payer: MEDICARE | Attending: Cardiovascular Disease | Primary: Internal Medicine

## 2024-01-17 NOTE — Cardio/Pulmonary (Signed)
 "          Cardiac Rehab Nutrition Assessment- 1:1 Evaluation  01/17/2024      NAME: Jeffrey Soto DOB: 1945-03-15 AGE: 78 y.o.  GENDER: male  CARDIAC REHAB ADMITTING DIAGNOSIS: No admission diagnoses are documented for this encounter.    PROBLEM LIST:  Patient Active Problem List   Diagnosis    Elevated lipids    Fatty liver    Prostate cancer (HCC)    Elevated PSA    HTN (hypertension)    GERD (gastroesophageal reflux disease)    NSTEMI (non-ST elevated myocardial infarction) (HCC)         LABS:   No results found for: HBA1C, HBA1CPOC  Lab Results   Component Value Date/Time    CHOL 215 12/09/2023 02:43 AM    HDL 35 12/09/2023 02:43 AM    LDL 129 12/09/2023 02:43 AM    LDL 105 05/30/2021 03:03 PM    VLDL 51 12/09/2023 02:43 AM    VLDL 36 05/30/2021 03:03 PM         MEDICATIONS/SUPPLEMENTS:   Scheduled Meds:   triamcinolone  acetonide  40 mg Intra-artICUlar Once    triamcinolone  acetonide  40 mg Intra-artICUlar Once    triamcinolone  acetonide  40 mg Intra-artICUlar Once     Continuous Infusions:  PRN Meds:.  Prior to Admission medications   Medication Sig Start Date End Date Taking? Authorizing Provider   clopidogrel (PLAVIX) 75 MG tablet Take 1 tablet by mouth daily    [provider]   Evolocumab (REPATHA) SOSY syringe Inject 1 mL into the skin every 14 days    [provider]   simvastatin  (ZOCOR ) 5 MG tablet For the first 2 weeks , take one pill each Thursday. Starting with the 3rd week and thereafter , take one pill each Monday and Thursday 12/21/23   Hendrix, W Cliff IV, MD   valsartan  (DIOVAN ) 80 MG tablet Take 1 tablet by mouth daily  Patient taking differently: Take 2 tablets by mouth daily 12/21/23   Rosamond LELON Pennant IV, MD   dilTIAZem  (CARDIZEM  CD) 180 MG extended release capsule Take 1 capsule by mouth daily 12/11/23   Goel, Punit, MD   metoprolol  succinate (TOPROL  XL) 50 MG extended release tablet Take 1 tablet by mouth daily 12/11/23   Goel, Punit, MD   aspirin  81 MG EC tablet Take  1 tablet by mouth daily 12/11/23   Goel, Punit, MD   FLUoxetine  (PROZAC ) 10 MG capsule TAKE 1 CAPSULE BY MOUTH DAILY 10/31/23   Rosamond LELON Pennant IV, MD   famotidine  (PEPCID ) 20 MG tablet TAKE 1 TABLET BY MOUTH 2 TIMES A DAY 07/31/23   Rosamond LELON Pennant IV, MD   omeprazole (PRILOSEC) 20 MG delayed release capsule Take 1 capsule by mouth daily    [provider]   METAMUCIL FIBER PO Take by mouth 2 tablespoonsful daily    [provider]   tamsulosin  (FLOMAX ) 0.4 MG capsule Take 1 capsule by mouth    Automatic Reconciliation, Ar           ANTHROPOMETRICS:    Ht Readings from Last 1 Encounters:   12/21/23 1.854 m (6' 1)      Wt Readings from Last 10 Encounters:   01/17/24 102.2 kg (225 lb 3.2 oz)   01/14/24 102.5 kg (226 lb)   01/11/24 103.1 kg (227 lb 3.2 oz)   01/07/24 103 kg (227 lb)   01/04/24 103.6 kg (228 lb 6.4 oz)  01/03/24 103.7 kg (228 lb 9.6 oz)   12/31/23 103.7 kg (228 lb 9.6 oz)   12/28/23 103.4 kg (228 lb)   12/27/23 103.4 kg (228 lb)   12/21/23 102.5 kg (226 lb)      BMI Readings from Last 10 Encounters:   01/17/24 29.71 kg/m   01/14/24 29.82 kg/m   01/11/24 29.98 kg/m   01/07/24 29.95 kg/m   01/04/24 30.13 kg/m   01/03/24 30.16 kg/m   12/31/23 30.16 kg/m   12/28/23 30.08 kg/m   12/27/23 30.08 kg/m   12/21/23 29.82 kg/m      IBW:183 # +/- 10%  %IBW: 124 % +/- 10%    BMI Category: Overweight  Waist (measured at intake): Waist Circumference (In): 48.5       Reported Diet Yk:Gjfzd A Traber avoids eating cheese.    Rate Your Plate Score:  Rate Your Plate Total Score: 55    Score 41-57: Some choices need improving   (Score 58-72: Making many healthy choices; 41-57: Some choices need improving 24-40: many choices need improving)    24 HOUR DIET RECALL  Breakfast Bagel with sliced tomato olive oil and oregano, toasted or old fashioned oats with strawberry preserves, water   Lunch Late lunch: baked beans or navy beans or vegetable soup, mashed potatoes, green beans, banana peppers,  vinegar and applesauce, water   Dinner skips   Snacks Animal crackers with peanut butter with 1/2 glass milk, variety of nuts   Beverages Water and 2% milk (less than a gallon per week)       Environmental/Social:Taseen [employer] for groceries and prepares meals.  He rarely eats out and enjoys playing golf.      Estimated Energy Needs: 2500 (using [place] [employer] Jeor with AF 1.3)    NUTRITION INTERVENTION:  Nutrition 60 minute one-on-one education & goal setting with Lynwood DELENA Dawn    Reviewed weight history and patient's verbalized weight goal as well as any real or perceived barriers to obtaining the goal. Collaborated with patient to set a specific short and long term weight goal.     Reviewed Rate Your Plate and conducted a verbal diet recall.  Assessed for environmental, financial, psychosocial, physical and comorbidities that may impact the food and eating patterns / behaviors.    Collaborated with patient to set specific nutrient goals as well as specific food / behavior changes that will help patient meet the overall goal of following a heart healthy eating pattern (using guidelines as set forth by the American Heart Association and modeled after healthful eating patterns as recognized by the USDA Dietary Guidelines such as DASH, Mediterranean or plant-based).    Briefly reviewed with Lynwood DELENA Dawn the nutrition information in the Cardiac Rehab patient education book and encouraged BERNERD TERHUNE to read thoroughly, ask questions as needed, and use for future reference for heart healthy nutrition information.    Supplemental handouts provided: cookbook    KENTAVIUS DETTORE is scheduled to participate in Cardiac Rehab group nutrition classes.          PATIENT GOALS:    Weight Goals:  Short Term Weight Goal:215 lbs  Long Term Weight Goal:210 lbs    Nutrition Goals:  Daily Recommendations: 1) choose low sodium baked beans 2) add non starchy veggies at lunch 3-4 days per week  Calories: 2300 /day  (for weight  loss)  Saturated Fat: no more than 15 g/day  Trans Fat: 0 g/day  Sodium: no more than 2000  mg/day  Fruit: 2 cups / day  Vegetables: 3 cups/day    Other:  - read and compare food labels        Questions addressed. Follow-up plans discussed. Lynwood DELENA Dawn verbalized understanding.    Eleanor Ip MS RDN       "

## 2024-01-18 ENCOUNTER — Inpatient Hospital Stay: Admit: 2024-01-18 | Payer: MEDICARE | Attending: Cardiovascular Disease | Primary: Internal Medicine

## 2024-01-21 ENCOUNTER — Inpatient Hospital Stay: Admit: 2024-01-21 | Payer: MEDICARE | Attending: Cardiovascular Disease | Primary: Internal Medicine

## 2024-01-24 ENCOUNTER — Encounter: Payer: MEDICARE | Primary: Internal Medicine

## 2024-01-25 ENCOUNTER — Encounter: Payer: MEDICARE | Primary: Internal Medicine

## 2024-01-31 ENCOUNTER — Inpatient Hospital Stay: Admit: 2024-01-31 | Payer: MEDICARE | Attending: Cardiovascular Disease | Primary: Internal Medicine

## 2024-01-31 DIAGNOSIS — I214 Non-ST elevation (NSTEMI) myocardial infarction: Principal | ICD-10-CM

## 2024-02-01 ENCOUNTER — Inpatient Hospital Stay: Admit: 2024-02-01 | Payer: MEDICARE | Attending: Cardiovascular Disease | Primary: Internal Medicine

## 2024-02-04 ENCOUNTER — Encounter: Payer: MEDICARE | Primary: Internal Medicine

## 2024-02-07 ENCOUNTER — Inpatient Hospital Stay: Admit: 2024-02-07 | Payer: MEDICARE | Attending: Cardiovascular Disease | Primary: Internal Medicine

## 2024-02-08 ENCOUNTER — Encounter: Payer: MEDICARE | Primary: Internal Medicine

## 2024-02-11 ENCOUNTER — Inpatient Hospital Stay: Admit: 2024-02-11 | Payer: MEDICARE | Attending: Cardiovascular Disease | Primary: Internal Medicine

## 2024-02-11 MED ORDER — FAMOTIDINE 20 MG PO TABS
20 | ORAL_TABLET | Freq: Two times a day (BID) | ORAL | 11 refills | 30.00000 days | Status: AC
Start: 2024-02-11 — End: ?

## 2024-02-14 ENCOUNTER — Inpatient Hospital Stay: Admit: 2024-02-14 | Payer: MEDICARE | Attending: Cardiovascular Disease | Primary: Internal Medicine

## 2024-02-15 ENCOUNTER — Inpatient Hospital Stay: Admit: 2024-02-15 | Payer: MEDICARE | Attending: Cardiovascular Disease | Primary: Internal Medicine

## 2024-02-18 ENCOUNTER — Inpatient Hospital Stay: Payer: MEDICARE | Attending: Cardiovascular Disease | Primary: Internal Medicine

## 2024-02-21 ENCOUNTER — Inpatient Hospital Stay: Admit: 2024-02-21 | Payer: MEDICARE | Attending: Cardiovascular Disease | Primary: Internal Medicine

## 2024-02-22 ENCOUNTER — Inpatient Hospital Stay: Admit: 2024-02-22 | Payer: MEDICARE | Attending: Cardiovascular Disease | Primary: Internal Medicine

## 2024-02-25 ENCOUNTER — Inpatient Hospital Stay: Admit: 2024-02-25 | Payer: MEDICARE | Attending: Cardiovascular Disease | Primary: Internal Medicine

## 2024-02-28 ENCOUNTER — Inpatient Hospital Stay: Admit: 2024-02-28 | Payer: MEDICARE | Attending: Cardiovascular Disease | Primary: Internal Medicine

## 2024-02-29 ENCOUNTER — Inpatient Hospital Stay: Admit: 2024-02-29 | Payer: MEDICARE | Attending: Cardiovascular Disease | Primary: Internal Medicine

## 2024-03-03 ENCOUNTER — Encounter: Payer: MEDICARE | Primary: Internal Medicine

## 2024-03-06 ENCOUNTER — Inpatient Hospital Stay: Admit: 2024-03-06 | Payer: MEDICARE | Attending: Cardiovascular Disease | Primary: Internal Medicine

## 2024-03-06 DIAGNOSIS — I214 Non-ST elevation (NSTEMI) myocardial infarction: Principal | ICD-10-CM

## 2024-03-07 ENCOUNTER — Inpatient Hospital Stay: Admit: 2024-03-07 | Payer: MEDICARE | Attending: Cardiovascular Disease | Primary: Internal Medicine

## 2024-03-07 NOTE — Cardio/Pulmonary (Addendum)
 DISCHARGE SUMMARY NOTE  03/07/2024      NAME: FERAS GARDELLA DOB: Oct 30, 1945 AGE: 79 y.o.  GENDER: male    CARDIAC REHAB ADMITTING DIAGNOSIS: Acute myocardial infarction, subendocardial infarction, initial episode of care [facility]) [I21.4]  Stented coronary artery [Z95.5]    REFERRING PHYSICIAN: Moises Irl CROME, MD    MEDICAL HX:  Past Medical History:   Diagnosis Date    Arrhythmia     Hx PAT. dr. Chanda evaluated.     Bursitis of both shoulders     CAD (coronary artery disease) 12/08/2023    3 stents to distal RCA    Depression     Elevated lipids 12/16/2009    Fatty liver     Gallbladder polyp     GERD (gastroesophageal reflux disease) 06/26/2003    EGD: duodenitis , dilation of Esophageal ring , DR. [name]    HTN (hypertension) 12/16/2009    Prostate cancer (HCC) 06/02/2017    GLeason 3+ 3= 6 . 2019 . Dr. Bennet Mango     Past Surgical History:   Procedure Laterality Date    CARDIAC PROCEDURE N/A 12/09/2023    Coronary angiography performed by Moises Irl CROME, MD at [facility] CARDIAC CATH LAB    CARDIAC PROCEDURE N/A 12/09/2023    Instantaneous wave-free ratio (iFR) performed by Moises Irl CROME, MD at [facility] CARDIAC CATH LAB    CARDIAC PROCEDURE N/A 12/09/2023    Percutaneous coronary intervention performed by Moises Irl CROME, MD at [facility] CARDIAC CATH LAB    CATARACT EXTRACTION Bilateral     COLONOSCOPY  06/26/03    diverticulosis, polypectomy, f/u dr. Alray     COLONOSCOPY N/A 03/25/2022    COLONOSCOPY DIAGNOSTIC performed by Danial Bias, MD at [facility] ENDOSCOPY    GI      right inguinal hernia repair x3    OTHER SURGICAL HISTORY      removal pilonidal cyst    UROLOGICAL SURGERY  05/25/2017    Prostate biopsy by [name] MD    UROLOGICAL SURGERY      right orchiectomy for benign growth        LABS:     No results found for: HBA1C, HBA1CPOC  Lab Results   Component Value Date/Time    CHOL 215 12/09/2023 02:43 AM    HDL 35 12/09/2023 02:43 AM    LDL 129 12/09/2023 02:43 AM    LDL 105 05/30/2021 03:03 PM    VLDL 51  12/09/2023 02:43 AM    VLDL 36 05/30/2021 03:03 PM         ANTHROPOMETRICS:      Ht Readings from Last 1 Encounters:   12/21/23 1.854 m (6' 1)      Wt Readings from Last 1 Encounters:   03/07/24 99.8 kg (220 lb)        WAIST: 43 inches         PROGRAM SUMMARY:    Lynwood DELENA Dawn completed 36 sessions in phase II of the Cardiac Rehab program. Patient biked on the Upright Bike at Level 3 for 1.6 miles, peak watts of 64, and average of 60 RPMs for a final MET level of 4.8. Lynwood DELENA Dawn plans to continue exercising at home and/or a gym and has set revised goals that include 30 minutes of moderate-intensity aerobic activity 5 days weekly.      MET level increased from 1.7 at intake to 4.8 at discharge. Weight lost was 6 lbs. and 4.5 inches from their waist.  Lynwood DELENA Dawn will focus on diet and nutrition at home and has received individualized healthy nutrition recommendations from [facility] Cardiac Rehab staff.  They are aware of their cardiac disease risk factors and cardiac medications. All questions were addressed and discharge plans discussed. Lynwood DELENA Dawn verbalized understanding.      Arlean SHAUNNA Raisin, RN, BSN  03/07/2024

## 2024-03-10 ENCOUNTER — Encounter: Payer: MEDICARE | Primary: Internal Medicine

## 2024-03-17 ENCOUNTER — Inpatient Hospital Stay
Admit: 2024-03-17 | Discharge: 2024-03-17 | Disposition: A | Discharge status: Home / Self Care | Payer: MEDICARE | Arrived: AM | Attending: Student in an Organized Health Care Education/Training Program

## 2024-03-17 DIAGNOSIS — I10 Essential (primary) hypertension: Secondary | ICD-10-CM

## 2024-03-17 LAB — CBC WITH AUTO DIFFERENTIAL
Basophils %: 0 % (ref 0–1)
Basophils Absolute: 0 K/UL (ref 0.0–0.1)
Eosinophils %: 5 % (ref 0–7)
Eosinophils Absolute: 0.36 K/UL (ref 0.0–0.4)
Hematocrit: 48.3 % (ref 36.6–50.3)
Hemoglobin: 15.5 g/dL (ref 12.1–17.0)
Immature Granulocytes %: 0 %
Immature Granulocytes Absolute: 0 K/UL
Lymphocytes %: 24 % (ref 12–49)
Lymphocytes Absolute: 1.7 K/UL (ref 0.8–3.5)
MCH: 29.2 pg (ref 26.0–34.0)
MCHC: 32.1 g/dL (ref 30.0–36.5)
MCV: 91 FL (ref 80.0–99.0)
MPV: 10.2 FL (ref 8.9–12.9)
Monocytes %: 9 % (ref 5–13)
Monocytes Absolute: 0.64 K/UL (ref 0.0–1.0)
Neutrophils %: 62 % (ref 32–75)
Neutrophils Absolute: 4.4 K/UL (ref 1.8–8.0)
Nucleated RBCs: 0 /100{WBCs}
Platelets: 205 K/uL (ref 150–400)
RBC: 5.31 M/uL (ref 4.10–5.70)
RDW: 14.9 % — ABNORMAL HIGH (ref 11.5–14.5)
WBC: 7.1 K/uL (ref 4.1–11.1)
nRBC: 0 K/uL (ref 0.00–0.01)

## 2024-03-17 LAB — COMPREHENSIVE METABOLIC PANEL
Albumin/Globulin Ratio: 1.1 (ref 1.1–2.2)
Albumin: 3.9 g/dL (ref 3.5–5.2)
Anion Gap: 9 mmol/L (ref 2–14)
BUN/Creatinine Ratio: 20 (ref 12–20)
BUN: 17 mg/dL (ref 8–23)
CO2: 24 mmol/L (ref 20–29)
Calcium: 9.6 mg/dL (ref 8.8–10.2)
Chloride: 101 mmol/L (ref 98–107)
Creatinine: 0.86 mg/dL (ref 0.70–1.20)
Est, Glom Filt Rate: 89 ml/min/1.73m2 (ref >59)
Globulin: 3.7 g/dL (ref 2.0–4.0)
Glucose: 116 mg/dL — ABNORMAL HIGH (ref 65–100)
Sodium: 134 mmol/L — ABNORMAL LOW (ref 136–145)
Total Bilirubin: 0.7 mg/dL (ref 0.0–1.2)
Total Protein: 7.6 g/dL (ref 6.4–8.3)

## 2024-03-17 LAB — EKG 12-LEAD
Atrial Rate: 54 {beats}/min
P Axis: 52 degrees
P-R Interval: 206 ms
Q-T Interval: 438 ms
QRS Duration: 104 ms
QTc Calculation (Bazett): 415 ms
R Axis: -49 degrees
T Axis: 7 degrees
Ventricular Rate: 54 {beats}/min

## 2024-03-17 LAB — TROPONIN: Troponin T: 7.7 ng/L (ref 0–22)

## 2024-03-17 NOTE — ED Triage Notes (Signed)
 Pt arrives via ems for cc of HTN. Pt states he checked his bp at home and SBP was in 200s, DBP in 100s. Pt has hx of an MI and cardiac stents that were placed in October 2025. Pt states that since his MI he's had a 'slight uneasy feeling'.     Pt denies chest pain, SOB, n/v/d, lightheadedness, numbness/tingling.

## 2024-03-17 NOTE — ED Provider Notes (Signed)
 HISTORY OF PRESENT ILLNESS  79 year old male presents for evaluation of elevated blood pressure. He was recently discharged from cardiac rehabilitation in October following a myocardial infarction and coronary artery stenting. He was provided with a home blood pressure monitor and reports elevated readings at home with systolics in the 200s and diastolics in the 100s. 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 He reports playing golf yesterday. The patient denies any chest pain, shortness of breath, or recent changes to his antihypertensive medications.    PAST MEDICAL HISTORY  - Myocardial infarction (October)    PAST SURGICAL HISTORY  - Coronary artery stenting (October)    SOCIAL HISTORY  - Plays golf    RELEVANT [employer]S OF HEALTH  - Has a home blood pressure monitor    PHYSICAL EXAM  Vitals: BP 223/92 mm Hg, BP 188/91 mm Hg, HR 52 bpm, afebrile.  General: Well-appearing, in no acute distress, afebrile.  Eyes: Appear normal with no scleral icterus.  HENT: Atraumatic. Moist mucous membranes, no pharyngeal erythema, edema or lesions.  Neck: Atraumatic, supple.  Cardiac: Regular rate and rhythm.  Respiratory: Respirations are clear, even, and unlabored.  Abdomen: Soft, nontender, nondistended.  GU: No CVAT.  MS: No trace edema in the lower extremities.  Skin: No exanthems, no cyanosis, no diaphoresis.  Back: Atraumatic.  Neurologic: No altered mental status, speech is fluent. Gait is within normal limits. No cerebellar deficits. No motor deficits. No sensory deficits.  Psychological: Cooperative and participatory with examination.  SUMMARY  The patient presented with markedly elevated blood pressures (223/92 and 188/91) and was diagnosed with hypertensive urgency. EKG was nonischemic and laboratory results were reassuring. The patient was discharged home with instructions for outpatient follow-up, including a duplex renal study and medication  adjustment.    EKG  A 12-lead EKG was performed, and I independently interpret it as demonstrating nonischemic findings. No significant ST segment elevation or depression. No prolongation of the intervals.    LABS  The following laboratory tests were performed and independently reviewed with results being unremarkable: reassuring.    PATIENT DISCUSSION  I discussed available results with the patient, who was agreeable with the plan and discharge to home.    CONSULTANT DISCUSSION  I spoke with [name] (cardiology) and we reviewed the patients history, exam, and work up. We discussed the patients management, and [name] agreed to increase the valsartan  dose and recommended an outpatient duplex renal study.    MEDICAL DECISION MAKING  The patient presented with hypertensive urgency, with blood pressures recorded at 223/92 and 188/91. EKG was nonischemic and laboratory results were reassuring. Cardiology was consulted ([name]), who agreed with increasing the valsartan  dose and arranging an outpatient duplex renal study to evaluate for possible renal artery stenosis. The patient was agreeable to the plan and was discharged home with instructions for outpatient follow-up and medication adjustment.    DIFFERENTIAL DIAGNOSES  - Hypertensive Urgency: Most likely/working diagnosis supported by markedly elevated blood pressures (223/92, 188/91) and nonischemic EKG; context: reassuring labs, stable heart rate, afebrile.  - Renal Artery Stenosis: Considered resistant hypertension and need for outpatient duplex renal study; less likely given reassuring labs, no acute kidney injury, stable hemodynamics.  - Medication Nonadherence: Considered history of hypertension and need for increased valsartan  dose; less likely given patient agreeable to medication adjustment, no evidence of missed doses in ED, stable presentation.  - Pheochromocytoma: Considered severe hypertension and bradycardia; less likely given  absence  of paroxysmal symptoms, reassuring labs, afebrile.  - Hyperthyroidism: Considered hypertension; less likely given absence of tachycardia, afebrile, no thyrotoxic symptoms.  - Obstructive Sleep Apnea: Considered resistant hypertension; less likely given no reported sleep disturbance, afebrile, stable heart rate.    DISPOSITION  Discharge: Home  The patient was safely discharged home after evaluation for hypertensive urgency, with blood pressures recorded at 223/92 and 188/91. Cardiology consultation recommended increasing the valsartan  dose and arranging an outpatient duplex renal study to assess for possible renal artery stenosis. Laboratory results were reassuring and EKG was nonischemic. The patient was agreeable to the plan and received instructions for outpatient follow-up and medication adjustment.  I explained to the patient that if their symptoms are not improving as expected or if other new symptoms or signs of concern develop, other etiologies or diagnoses may need to be considered requiring other tests, treatments, consultations, and/or admission. The diagnosis, plan, expected course, follow-up, and return precautions were discussed and all questions were answered.    DIAGNOSIS  Primary Diagnosis:  - Hypertensive urgency (I16.0)       Lenon Celestine ORN, MD  03/17/24 [id number]
